# Patient Record
Sex: Female | Born: 1980 | Race: White | Hispanic: No | Marital: Married | Smoking: Former smoker
Health system: Southern US, Community
[De-identification: ages and names within clinical notes are randomized; demographics above are authoritative.]

## PROBLEM LIST (undated history)

## (undated) ENCOUNTER — Ambulatory Visit (HOSPITAL_BASED_OUTPATIENT_CLINIC_OR_DEPARTMENT_OTHER)

## (undated) DIAGNOSIS — I499 Cardiac arrhythmia, unspecified: Secondary | ICD-10-CM

## (undated) DIAGNOSIS — D759 Disease of blood and blood-forming organs, unspecified: Secondary | ICD-10-CM

## (undated) DIAGNOSIS — Z8669 Personal history of other diseases of the nervous system and sense organs: Secondary | ICD-10-CM

## (undated) DIAGNOSIS — R002 Palpitations: Secondary | ICD-10-CM

## (undated) DIAGNOSIS — R519 Headache, unspecified: Secondary | ICD-10-CM

## (undated) DIAGNOSIS — F419 Anxiety disorder, unspecified: Secondary | ICD-10-CM

## (undated) DIAGNOSIS — M1990 Unspecified osteoarthritis, unspecified site: Secondary | ICD-10-CM

## (undated) DIAGNOSIS — I8390 Asymptomatic varicose veins of unspecified lower extremity: Secondary | ICD-10-CM

## (undated) DIAGNOSIS — R51 Headache: Secondary | ICD-10-CM

## (undated) DIAGNOSIS — I739 Peripheral vascular disease, unspecified: Secondary | ICD-10-CM

## (undated) DIAGNOSIS — I2699 Other pulmonary embolism without acute cor pulmonale: Secondary | ICD-10-CM

## (undated) DIAGNOSIS — D649 Anemia, unspecified: Secondary | ICD-10-CM

## (undated) HISTORY — DX: Palpitations: R00.2

## (undated) HISTORY — PX: WISDOM TOOTH EXTRACTION: SHX21

## (undated) HISTORY — PX: TONSILLECTOMY: SUR1361

## (undated) HISTORY — DX: Asymptomatic varicose veins of unspecified lower extremity: I83.90

## (undated) HISTORY — PX: CHOLECYSTECTOMY: SHX55

## (undated) HISTORY — PX: TUBAL LIGATION: SHX77

## (undated) HISTORY — DX: Personal history of other diseases of the nervous system and sense organs: Z86.69

---

## 2001-06-03 ENCOUNTER — Inpatient Hospital Stay (HOSPITAL_COMMUNITY): Admission: AD | Admit: 2001-06-03 | Discharge: 2001-06-03 | Payer: Self-pay | Admitting: Obstetrics and Gynecology

## 2001-08-28 ENCOUNTER — Ambulatory Visit (HOSPITAL_COMMUNITY): Admission: RE | Admit: 2001-08-28 | Discharge: 2001-08-28 | Payer: Self-pay | Admitting: Obstetrics and Gynecology

## 2001-08-28 ENCOUNTER — Encounter: Payer: Self-pay | Admitting: Obstetrics and Gynecology

## 2001-09-01 ENCOUNTER — Ambulatory Visit (HOSPITAL_COMMUNITY): Admission: RE | Admit: 2001-09-01 | Discharge: 2001-09-01 | Payer: Self-pay | Admitting: Obstetrics and Gynecology

## 2001-09-03 ENCOUNTER — Inpatient Hospital Stay (HOSPITAL_COMMUNITY): Admission: AD | Admit: 2001-09-03 | Discharge: 2001-09-07 | Payer: Self-pay | Admitting: Obstetrics and Gynecology

## 2001-10-12 ENCOUNTER — Other Ambulatory Visit: Admission: RE | Admit: 2001-10-12 | Discharge: 2001-10-12 | Payer: Self-pay | Admitting: Obstetrics and Gynecology

## 2001-11-21 ENCOUNTER — Encounter: Payer: Self-pay | Admitting: Obstetrics and Gynecology

## 2001-11-21 ENCOUNTER — Ambulatory Visit (HOSPITAL_COMMUNITY): Admission: RE | Admit: 2001-11-21 | Discharge: 2001-11-21 | Payer: Self-pay | Admitting: Obstetrics and Gynecology

## 2001-12-12 ENCOUNTER — Observation Stay (HOSPITAL_COMMUNITY): Admission: RE | Admit: 2001-12-12 | Discharge: 2001-12-13 | Payer: Self-pay | Admitting: General Surgery

## 2001-12-12 ENCOUNTER — Encounter: Payer: Self-pay | Admitting: General Surgery

## 2003-07-29 ENCOUNTER — Other Ambulatory Visit: Admission: RE | Admit: 2003-07-29 | Discharge: 2003-07-29 | Payer: Self-pay | Admitting: Obstetrics and Gynecology

## 2003-12-25 ENCOUNTER — Inpatient Hospital Stay (HOSPITAL_COMMUNITY): Admission: AD | Admit: 2003-12-25 | Discharge: 2003-12-25 | Payer: Self-pay | Admitting: Obstetrics and Gynecology

## 2003-12-26 ENCOUNTER — Inpatient Hospital Stay (HOSPITAL_COMMUNITY): Admission: AD | Admit: 2003-12-26 | Discharge: 2003-12-26 | Payer: Self-pay | Admitting: Obstetrics and Gynecology

## 2004-01-01 ENCOUNTER — Inpatient Hospital Stay (HOSPITAL_COMMUNITY): Admission: AD | Admit: 2004-01-01 | Discharge: 2004-01-04 | Payer: Self-pay | Admitting: Obstetrics and Gynecology

## 2004-02-28 ENCOUNTER — Other Ambulatory Visit: Admission: RE | Admit: 2004-02-28 | Discharge: 2004-02-28 | Payer: Self-pay | Admitting: Obstetrics and Gynecology

## 2014-08-26 ENCOUNTER — Encounter: Payer: Self-pay | Admitting: Vascular Surgery

## 2014-08-26 ENCOUNTER — Other Ambulatory Visit: Payer: Self-pay | Admitting: *Deleted

## 2014-08-26 DIAGNOSIS — I83813 Varicose veins of bilateral lower extremities with pain: Secondary | ICD-10-CM

## 2014-10-11 ENCOUNTER — Encounter: Payer: Self-pay | Admitting: Vascular Surgery

## 2014-10-15 ENCOUNTER — Encounter (HOSPITAL_COMMUNITY): Payer: Self-pay

## 2014-10-15 ENCOUNTER — Encounter: Payer: Self-pay | Admitting: Vascular Surgery

## 2014-12-13 ENCOUNTER — Encounter: Payer: Self-pay | Admitting: Vascular Surgery

## 2014-12-17 ENCOUNTER — Encounter (HOSPITAL_COMMUNITY): Payer: BC Managed Care – PPO

## 2014-12-17 ENCOUNTER — Encounter: Payer: BC Managed Care – PPO | Admitting: Vascular Surgery

## 2015-06-09 DIAGNOSIS — I4892 Unspecified atrial flutter: Secondary | ICD-10-CM | POA: Insufficient documentation

## 2015-06-09 DIAGNOSIS — E0590 Thyrotoxicosis, unspecified without thyrotoxic crisis or storm: Secondary | ICD-10-CM | POA: Insufficient documentation

## 2015-06-09 DIAGNOSIS — R000 Tachycardia, unspecified: Secondary | ICD-10-CM | POA: Insufficient documentation

## 2015-06-09 DIAGNOSIS — R42 Dizziness and giddiness: Secondary | ICD-10-CM | POA: Insufficient documentation

## 2015-06-09 DIAGNOSIS — E878 Other disorders of electrolyte and fluid balance, not elsewhere classified: Secondary | ICD-10-CM | POA: Insufficient documentation

## 2015-06-09 DIAGNOSIS — I4891 Unspecified atrial fibrillation: Secondary | ICD-10-CM | POA: Insufficient documentation

## 2015-09-18 DIAGNOSIS — I83812 Varicose veins of left lower extremities with pain: Secondary | ICD-10-CM | POA: Insufficient documentation

## 2016-04-20 DIAGNOSIS — K219 Gastro-esophageal reflux disease without esophagitis: Secondary | ICD-10-CM | POA: Insufficient documentation

## 2016-04-20 DIAGNOSIS — G43109 Migraine with aura, not intractable, without status migrainosus: Secondary | ICD-10-CM | POA: Insufficient documentation

## 2016-05-02 ENCOUNTER — Encounter (HOSPITAL_COMMUNITY): Payer: Self-pay

## 2016-05-02 ENCOUNTER — Other Ambulatory Visit: Payer: Self-pay

## 2016-05-02 ENCOUNTER — Emergency Department (HOSPITAL_COMMUNITY): Payer: BC Managed Care – PPO

## 2016-05-02 ENCOUNTER — Emergency Department (HOSPITAL_BASED_OUTPATIENT_CLINIC_OR_DEPARTMENT_OTHER)
Admit: 2016-05-02 | Discharge: 2016-05-02 | Disposition: A | Discharge status: Home / Self Care | Payer: BC Managed Care – PPO

## 2016-05-02 ENCOUNTER — Observation Stay (HOSPITAL_COMMUNITY)
Admission: EM | Admit: 2016-05-02 | Discharge: 2016-05-03 | Disposition: A | Discharge status: Home / Self Care | Payer: BC Managed Care – PPO | Attending: Internal Medicine | Admitting: Internal Medicine

## 2016-05-02 DIAGNOSIS — Z87891 Personal history of nicotine dependence: Secondary | ICD-10-CM | POA: Diagnosis not present

## 2016-05-02 DIAGNOSIS — Z79899 Other long term (current) drug therapy: Secondary | ICD-10-CM | POA: Diagnosis not present

## 2016-05-02 DIAGNOSIS — R52 Pain, unspecified: Secondary | ICD-10-CM

## 2016-05-02 DIAGNOSIS — I2699 Other pulmonary embolism without acute cor pulmonale: Secondary | ICD-10-CM | POA: Diagnosis not present

## 2016-05-02 LAB — BASIC METABOLIC PANEL
Anion gap: 8 (ref 5–15)
BUN: 8 mg/dL (ref 6–20)
CALCIUM: 9.6 mg/dL (ref 8.9–10.3)
CHLORIDE: 105 mmol/L (ref 101–111)
CO2: 24 mmol/L (ref 22–32)
Creatinine, Ser: 0.69 mg/dL (ref 0.44–1.00)
GFR calc non Af Amer: >60 mL/min (ref >60)
Glucose, Bld: 109 mg/dL — ABNORMAL HIGH (ref 65–99)
POTASSIUM: 3.6 mmol/L (ref 3.5–5.1)
SODIUM: 137 mmol/L (ref 135–145)

## 2016-05-02 LAB — CBC
HCT: 39.2 % (ref 36.0–46.0)
HEMOGLOBIN: 13.5 g/dL (ref 12.0–15.0)
MCH: 30.3 pg (ref 26.0–34.0)
MCHC: 34.4 g/dL (ref 30.0–36.0)
MCV: 87.9 fL (ref 78.0–100.0)
Platelets: 200 10*3/uL (ref 150–400)
RBC: 4.46 MIL/uL (ref 3.87–5.11)
RDW: 12.7 % (ref 11.5–15.5)
WBC: 7.7 10*3/uL (ref 4.0–10.5)

## 2016-05-02 LAB — I-STAT TROPONIN, ED: TROPONIN I, POC: 0 ng/mL (ref 0.00–0.08)

## 2016-05-02 LAB — I-STAT BETA HCG BLOOD, ED (MC, WL, AP ONLY): I-stat hCG, quantitative: <5 m[IU]/mL (ref <5)

## 2016-05-02 LAB — D-DIMER, QUANTITATIVE (NOT AT ARMC): D DIMER QUANT: 1.25 ug{FEU}/mL — AB (ref 0.00–0.50)

## 2016-05-02 MED ORDER — IOPAMIDOL (ISOVUE-370) INJECTION 76%
INTRAVENOUS | Status: AC
  Filled 2016-05-02: qty 100

## 2016-05-02 MED ORDER — RIVAROXABAN 15 MG PO TABS
15.0000 mg | ORAL_TABLET | Freq: Once | ORAL | Status: AC
Start: 2016-05-02 — End: 2016-05-02
  Administered 2016-05-02: 15 mg via ORAL
  Filled 2016-05-02: qty 1

## 2016-05-02 MED ORDER — IOPAMIDOL (ISOVUE-370) INJECTION 76%
INTRAVENOUS | Status: AC
  Administered 2016-05-02: 100 mL
  Filled 2016-05-02: qty 100

## 2016-05-02 MED ORDER — RIVAROXABAN (XARELTO) EDUCATION KIT FOR DVT/PE PATIENTS
PACK | Freq: Once | Status: AC
  Administered 2016-05-02: 23:00:00
  Filled 2016-05-02: qty 1

## 2016-05-02 NOTE — Discharge Instructions (Signed)
Information on my medicine - XARELTO (rivaroxaban)  This medication education was reviewed with me or my healthcare representative as part of my discharge preparation.  The pharmacist that spoke with me during my hospital stay was:  Clive Parcel George, [facility]  WHY WAS Erica Ho PRESCRIBED FOR YOU? Xarelto was prescribed to treat blood clots that may have been found in the veins of your legs (deep vein thrombosis) or in your lungs (pulmonary embolism) and to reduce the risk of them occurring again.  What do you need to know about Xarelto? The starting dose is one 15 mg tablet taken TWICE daily with food for the FIRST 21 DAYS then on December 25  the dose is changed to one 20 mg tablet taken ONCE A DAY with your evening meal.  DO NOT stop taking Xarelto without talking to the health care provider who prescribed the medication.  Refill your prescription for 20 mg tablets before you run out.  After discharge, you should have regular check-up appointments with your healthcare provider that is prescribing your Xarelto.  In the future your dose may need to be changed if your kidney function changes by a significant amount.  What do you do if you miss a dose? If you are taking Xarelto TWICE DAILY and you miss a dose, take it as soon as you remember. You may take two 15 mg tablets (total 30 mg) at the same time then resume your regularly scheduled 15 mg twice daily the next day.  If you are taking Xarelto ONCE DAILY and you miss a dose, take it as soon as you remember on the same day then continue your regularly scheduled once daily regimen the next day. Do not take two doses of Xarelto at the same time.   Important Safety Information Xarelto is a blood thinner medicine that can cause bleeding. You should call your healthcare provider right away if you experience any of the following: ? Bleeding from an injury or your nose that does not stop. ? Unusual colored urine (red or dark brown) or unusual  colored stools (red or black). ? Unusual bruising for unknown reasons. ? A serious fall or if you hit your head (even if there is no bleeding).  Some medicines may interact with Xarelto and might increase your risk of bleeding while on Xarelto. To help avoid this, consult your healthcare provider or pharmacist prior to using any new prescription or non-prescription medications, including herbals, vitamins, non-steroidal anti-inflammatory drugs (NSAIDs) and supplements.  This website has more information on Xarelto: [email or web address].xarelto.[email or web address].

## 2016-05-02 NOTE — Progress Notes (Signed)
VASCULAR LAB PRELIMINARY  PRELIMINARY  PRELIMINARY  PRELIMINARY  Left lower extremity venous duplex completed.    Preliminary report:  There is no DVT noted in the left lower extremity.  There is superficial thrombophlebitis noted throughout a varicosity in the left calf.   Gave report to [facility]iotta, PA-C  Adrian Dinovo, RVT 05/02/2016, 6:41 PM

## 2016-05-02 NOTE — ED Notes (Signed)
Spoke to Vascular tech and she is aware of the patient

## 2016-05-02 NOTE — ED Notes (Addendum)
Pt reported having mild central chest pain 1/10. Repeat EKG obtained and Dr. Effie ShyWentz made aware. VSS. NAD

## 2016-05-02 NOTE — ED Provider Notes (Signed)
[place] DEPT Provider Note   CSN: [id number] Arrival date & time: 05/02/16  1546     History   Chief Complaint CC: Palpitations   HPI   Blood pressure 126/80, pulse 94, temperature 97.7 F (36.5 C), temperature source Oral, resp. rate 13, weight 68.5 kg, SpO2 100 %.  Erica Ho is a 35 y.o. female complaining of sensation that is her heart is racing onset yesterday, lasted approximately 1 hour yesterday and she's had 2 episodes related last about 15 minutes. She has a lightheaded sensation like she is going to pass out when she has the episodes. There was no syncope. No associated chest pain, fever, cough, shortness of breath. She was seen at [facility] yesterday and had a normal workup including a normal TSH (patient has shown me the results on her phone). She has seen a cardiologist proximally 1 year ago and had a normal echo and normal on for long Holter monitoring. She was offered propranolol which she declined. 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 She's not on any hormonal birth control and she denies any recent immobilizations or history of DVT/PE, fever, hemoptysis. States that she doesn't intake any caffeine or energy drinks. She been eating and drinking normally does not take NSAIDs and has not had any melena or hematochezia.  Past Medical History:  Diagnosis Date  . History of migraine headaches   . Varicose veins     Patient Active Problem List   Diagnosis Date Noted  . Acute pulmonary embolism ([place]) 05/03/2016    Past Surgical History:  Procedure Laterality Date  . CESAREAN SECTION    . CHOLECYSTECTOMY    . TONSILLECTOMY    . TUBAL LIGATION      OB History    No data available       Home Medications    Prior to Admission medications   Medication Sig Start Date End Date Taking? Authorizing Provider  acetaminophen (TYLENOL) 500 MG  tablet Take 1,000 mg by mouth every 6 (six) hours as needed.   Yes Historical Provider, MD  esomeprazole (NEXIUM) 20 MG capsule Take 20 mg by mouth daily at 12 noon.   Yes Historical Provider, MD    Family History Family History  Problem Relation Age of Onset  . Clotting disorder Neg Hx     Social History Social History  Substance Use Topics  . Smoking status: Former Smoker    Quit date: 08/26/2011  . Smokeless tobacco: Never Used  . Alcohol use No     Allergies   Imitrex [sumatriptan]; Levaquin [levofloxacin in d5w]; and Levofloxacin   Review of Systems Review of Systems  10 systems reviewed and found to be negative, except as noted in the HPI.   Physical Exam Updated Vital Signs BP 124/73 (BP Location: Right Arm)   Pulse 87   Temp 98.6 F (37 C) (Oral)   Resp 16   Ht 5' 1"  (1.549 m)   Wt 67.7 kg   SpO2 100%   BMI 28.19 kg/m   Physical Exam  Constitutional: She is oriented to person, place, and time. She appears well-developed and well-nourished. No distress.  HENT:  Head: Normocephalic and atraumatic.  Mouth/Throat: Oropharynx is clear and moist.  Eyes: Conjunctivae and EOM are normal. Pupils are equal, round, and reactive to light.  Neck: Normal range of motion. No JVD present. No  tracheal deviation present.  Cardiovascular: Normal rate, regular rhythm and intact distal pulses.   Radial pulse equal bilaterally  Pulmonary/Chest: Effort normal and breath sounds normal. No stridor. No respiratory distress. She has no wheezes. She has no rales. She exhibits no tenderness.  Abdominal: Soft. She exhibits no distension and no mass. There is no tenderness. There is no rebound and no guarding.  Musculoskeletal: Normal range of motion. She exhibits no edema or tenderness.  Left lower extremity with compression stocking, mild tenderness to palpation along the posterior calf. No calf asymmetry, superficial collaterals, palpable cords, edema.    Neurological: She is  alert and oriented to person, place, and time.  Skin: Skin is warm. Capillary refill takes less than 2 seconds. She is not diaphoretic.  Psychiatric: She has a normal mood and affect.  Nursing note and vitals reviewed.    ED Treatments / Results  Labs (all labs ordered are listed, but only abnormal results are displayed) Labs Reviewed  BASIC METABOLIC PANEL - Abnormal; Notable for the following:       Result Value   Glucose, Bld 109 (*)    All other components within normal limits  D-DIMER, QUANTITATIVE (NOT AT [facility]) - Abnormal; Notable for the following:    D-Dimer, Quant 1.25 (*)    All other components within normal limits  CBC  CBC  I-STAT TROPOININ, ED  I-STAT BETA HCG BLOOD, ED ([facility], [facility], [facility] ONLY)    EKG  EKG Interpretation  Date/Time:  Sunday May 02 2016 15:52:14 EST Ventricular Rate:  94 PR Interval:  142 QRS Duration: 78 QT Interval:  340 QTC Calculation: 425 R Axis:   86 Text Interpretation:  Normal sinus rhythm ST abnormality, possible digitalis effect Abnormal ECG No old tracing to compare Confirmed by [facility]  MD, ELLIOTT [phone]) on 05/02/2016 8:48:16 PM       Radiology Ct Angio Chest Pe W And/or Wo Contrast  Result Date: 05/02/2016 CLINICAL DATA:  Palpitations. EXAM: CT ANGIOGRAPHY CHEST WITH CONTRAST TECHNIQUE: Multidetector CT imaging of the chest was performed using the standard protocol during bolus administration of intravenous contrast. Multiplanar CT image reconstructions and MIPs were obtained to evaluate the vascular anatomy. CONTRAST:  100 cc Isovue 370 intravenous COMPARISON:  None. FINDINGS: Cardiovascular: There is a branching subsegmental filling defect in the medial basal segment left lower lobe consistent with acute embolism. No other embolism is noted. Normal heart size.  No pericardial Mediastinum/Nodes: Negative for adenopathy or mass. Lungs/Pleura: There is no edema, consolidation, effusion, or pneumothorax. Average 3 mm pulmonary nodule in  the right lower lobe, 6:85. Upper Abdomen: Cholecystectomy. Musculoskeletal: No acute or aggressive finding Critical Value/emergent results were called by telephone at the time of interpretation on 05/02/2016 at 9:32 pm to Dr. Monico Blitz , who verbally acknowledged these results. Review of the MIP images confirms the above findings. IMPRESSION: 1. Positive for single subsegmental acute pulmonary embolism in the left lower lobe. 2. 3 mm right lower lobe pulmonary nodule. No follow-up needed if patient is low-risk. Non-contrast chest CT can be considered in 12 months if patient is high-risk. This recommendation follows the consensus statement: Guidelines for Management of Incidental Pulmonary Nodules Detected on CT Images: From the Fleischner Society 2017; Radiology 2017; 284:228-243. Electronically Signed   By: Monte Fantasia M.D.   On: 05/02/2016 21:35    Procedures Procedures (including critical care time)  Medications Ordered in ED Medications  Rivaroxaban (XARELTO) tablet 15 mg (not administered)    Followed by  rivaroxaban (XARELTO) tablet 20 mg (not administered)  Influenza vac split quadrivalent PF (FLUARIX) injection 0.5 mL (not administered)  iopamidol (ISOVUE-370) 76 % injection (100 mLs  Contrast Given 05/02/16 2026)  rivaroxaban [employer]) Education Kit for DVT/PE patients ( Does not apply Given 05/02/16 2310)  Rivaroxaban (XARELTO) tablet 15 mg (15 mg Oral Given 05/02/16 2310)     Initial Impression / Assessment and Plan / ED Course  I have reviewed the triage vital signs and the nursing notes.  Pertinent labs & imaging results that were available during my care of the patient were reviewed by me and considered in my medical decision making (see chart for details).  Clinical Course     Vitals:   05/02/16 2330 05/02/16 2345 05/03/16 0000 05/03/16 0122  BP: 100/56 118/72 120/81 124/73  Pulse: 74 88 76 87  Resp: 15 16 16 16   Temp:    98.6 F (37 C)  TempSrc:    Oral    SpO2: 98% 98% 99% 100%  Weight:    67.7 kg  Height:    5' 1"  (1.549 m)    Medications  Rivaroxaban (XARELTO) tablet 15 mg (not administered)    Followed by  rivaroxaban (XARELTO) tablet 20 mg (not administered)  Influenza vac split quadrivalent PF (FLUARIX) injection 0.5 mL (not administered)  iopamidol (ISOVUE-370) 76 % injection (100 mLs  Contrast Given 05/02/16 2026)  rivaroxaban [employer]) Education Kit for DVT/PE patients ( Does not apply Given 05/02/16 2310)  Rivaroxaban (XARELTO) tablet 15 mg (15 mg Oral Given 05/02/16 2310)    Olin Hauser C. Cage is 35 y.o. female presenting with Palpitations and feeling lightheaded. She has very mild chest pain. She's been worked up for palpitations in the past with a negative Holter monitor and echo 1 year ago. States that these palpitations feel different and they last for quite a bit longer and she feels more lightheaded. She had an episode lasting approximately 1 hour yesterday. She did not take any hormonal birth control.   EKG is without acute abnormality. Blood work reassuring with a negative troponin. D-dimer is elevated.  Radiology consult from Dr. Pascal Lux appreciated, positive for single sub-subsegmental acute PE. No signs of a right heart strain. Low risk bypass E score, recommend initiating Xarelto in close follow with primary care. Patient is extremely agitated and nervous and recommends admission to the hospital.  Unassigned admission to [facility] hospitalist Dr. Alcario Drought.      Final Clinical Impressions(s) / ED Diagnoses   Final diagnoses:  Pain  Other acute pulmonary embolism without acute cor pulmonale [facility])    New Prescriptions Current Discharge Medication List       Monico Blitz, PA-C 05/03/16 0154    Daleen Bo, MD 05/05/16 1016

## 2016-05-02 NOTE — ED Triage Notes (Signed)
Patient with intermittent rapid heart rate for the past few days. Seen at [facility] ED yesterday with no diagnosis. Has sen cardiology for same in past and had ECHO. On arrival states  That the symptoms have resolved. Last episode was at church today with near syncope. Alert and oriented

## 2016-05-03 ENCOUNTER — Encounter (HOSPITAL_COMMUNITY): Payer: Self-pay | Admitting: Internal Medicine

## 2016-05-03 DIAGNOSIS — R002 Palpitations: Secondary | ICD-10-CM | POA: Insufficient documentation

## 2016-05-03 DIAGNOSIS — I2699 Other pulmonary embolism without acute cor pulmonale: Secondary | ICD-10-CM | POA: Diagnosis not present

## 2016-05-03 LAB — CBC
HEMATOCRIT: 37 % (ref 36.0–46.0)
HEMOGLOBIN: 12.9 g/dL (ref 12.0–15.0)
MCH: 30.3 pg (ref 26.0–34.0)
MCHC: 34.9 g/dL (ref 30.0–36.0)
MCV: 86.9 fL (ref 78.0–100.0)
Platelets: 184 10*3/uL (ref 150–400)
RBC: 4.26 MIL/uL (ref 3.87–5.11)
RDW: 12.4 % (ref 11.5–15.5)
WBC: 8.8 10*3/uL (ref 4.0–10.5)

## 2016-05-03 MED ORDER — RIVAROXABAN (XARELTO) VTE STARTER PACK (15 & 20 MG): ORAL_TABLET | ORAL | 0 refills | Status: DC

## 2016-05-03 MED ORDER — RIVAROXABAN 20 MG PO TABS: 20.0000 mg | ORAL_TABLET | Freq: Every day | ORAL | Status: DC

## 2016-05-03 MED ORDER — RIVAROXABAN 15 MG PO TABS
15.0000 mg | ORAL_TABLET | Freq: Two times a day (BID) | ORAL | Status: DC
  Administered 2016-05-03: 15 mg via ORAL
  Filled 2016-05-03 (×2): qty 1

## 2016-05-03 MED ORDER — INFLUENZA VAC SPLIT QUAD 0.5 ML IM SUSY: 0.5000 mL | PREFILLED_SYRINGE | INTRAMUSCULAR | Status: DC

## 2016-05-03 NOTE — Progress Notes (Signed)
Patient given discharge instructions and all questions answered.  Pt. Has already received information on xarelto and the xarelto card.  Patient refused wheelchair and walked out with NT.

## 2016-05-03 NOTE — Discharge Summary (Signed)
Physician Discharge Summary  Erica Ho XBM:[id number] DOB: 09/17/1980 DOA: 05/02/2016  PCP: Dina RichUGH,ROBERT, MD  Admit date: 05/02/2016 Discharge date: 05/03/2016   Recommendations for Outpatient Follow-Up:   3 mm right lower lobe pulmonary nodule. Non-contrast chest CT can be considered in 12 months if patient is high-risk.    Discharge Diagnosis:   Active Problems:   Acute pulmonary embolism [facility](HCC)   Discharge disposition:  Home.  SNF:  Discharge Condition: Improved.  Diet recommendation: Low sodium, heart healthy.  Carbohydrate-modified.  Regular.  Wound care: None.   History of Present Illness:   Erica Ho is a 35 y.o. female with medical history significant of Varicose veins including prior and occasional superficial venous thrombosis of the leg.  Patient presents to the ED with c/o 1/10 central chest pain.  Onset yesterday.  Intermittent.  Associated palpitations.  [facility], they did a work that was unremarkable including TSH.  Actually has been having intermittent palpitations for over a year now it sounds like.   Hospital Course by Problem:   1. Acute PE - 1. Xarelto 2. Few episodes of tachycardia-- cardiology follow up if does not resolve with treatment of PE    Medical Consultants:    None.   Discharge Exam:   Vitals:   05/03/16 0122 05/03/16 0624  BP: 124/73 107/68  Pulse: 87 84  Resp: 16 18  Temp: 98.6 F (37 C) 98.3 F (36.8 C)   Vitals:   05/02/16 2345 05/03/16 0000 05/03/16 0122 05/03/16 0624  BP: 118/72 120/81 124/73 107/68  Pulse: 88 76 87 84  Resp: 16 16 16 18   Temp:   98.6 F (37 C) 98.3 F (36.8 C)  TempSrc:   Oral Oral  SpO2: 98% 99% 100% 100%  Weight:   67.7 kg (149 lb 3.2 oz) 67.5 kg (148 lb 14.4 oz)  Height:   5\' 1"  (1.549 m)     Gen:  NAD   The results of significant diagnostics from this hospitalization (including imaging, microbiology, ancillary and laboratory) are listed  below for reference.     Procedures and Diagnostic Studies:   Ct Angio Chest Pe W And/or Wo Contrast  Result Date: 05/02/2016 CLINICAL DATA:  Palpitations. EXAM: CT ANGIOGRAPHY CHEST WITH CONTRAST TECHNIQUE: Multidetector CT imaging of the chest was performed using the standard protocol during bolus administration of intravenous contrast. Multiplanar CT image reconstructions and MIPs were obtained to evaluate the vascular anatomy. CONTRAST:  100 cc Isovue 370 intravenous COMPARISON:  None. FINDINGS: Cardiovascular: There is a branching subsegmental filling defect in the medial basal segment left lower lobe consistent with acute embolism. No other embolism is noted. Normal heart size.  No pericardial Mediastinum/Nodes: Negative for adenopathy or mass. Lungs/Pleura: There is no edema, consolidation, effusion, or pneumothorax. Average 3 mm pulmonary nodule in the right lower lobe, 6:85. Upper Abdomen: Cholecystectomy. Musculoskeletal: No acute or aggressive finding Critical Value/emergent results were called by telephone at the time of interpretation on 05/02/2016 at 9:32 pm to Dr. Wynetta EmeryNICOLE PISCIOTTA , who verbally acknowledged these results. Review of the MIP images confirms the above findings. IMPRESSION: 1. Positive for single subsegmental acute pulmonary embolism in the left lower lobe. 2. 3 mm right lower lobe pulmonary nodule. No follow-up needed if patient is low-risk. Non-contrast chest CT can be considered in 12 months if patient is high-risk. This recommendation follows the consensus statement: Guidelines for Management of Incidental Pulmonary Nodules Detected on CT Images: From the Fleischner Society 2017;  Radiology 2017; [id number]:228-243. Electronically Signed   By: Marnee SpringJonathon  Watts M.D.   On: 05/02/2016 21:35     Labs:   Basic Metabolic Panel:  Recent Labs Lab 05/02/16 1604  NA 137  K 3.6  CL 105  CO2 24  GLUCOSE 109*  BUN 8  CREATININE 0.69  CALCIUM 9.6   GFR Estimated Creatinine  Clearance: 84.7 mL/min (by C-G formula based on SCr of 0.69 mg/dL). Liver Function Tests: No results for input(s): AST, ALT, ALKPHOS, BILITOT, PROT, ALBUMIN in the last 168 hours. No results for input(s): LIPASE, AMYLASE in the last 168 hours. No results for input(s): AMMONIA in the last 168 hours. Coagulation profile No results for input(s): INR, PROTIME in the last 168 hours.  CBC:  Recent Labs Lab 05/02/16 1604 05/03/16 0352  WBC 7.7 8.8  HGB 13.5 12.9  HCT 39.2 37.0  MCV 87.9 86.9  PLT 200 184   Cardiac Enzymes: No results for input(s): CKTOTAL, CKMB, CKMBINDEX, TROPONINI in the last 168 hours. BNP: Invalid input(s): POCBNP CBG: No results for input(s): GLUCAP in the last 168 hours. D-Dimer  Recent Labs  05/02/16 1604  DDIMER 1.25*   Hgb A1c No results for input(s): HGBA1C in the last 72 hours. Lipid Profile No results for input(s): CHOL, HDL, LDLCALC, TRIG, CHOLHDL, LDLDIRECT in the last 72 hours. Thyroid function studies No results for input(s): TSH, T4TOTAL, T3FREE, THYROIDAB in the last 72 hours.  Invalid input(s): FREET3 Anemia work up No results for input(s): VITAMINB12, FOLATE, FERRITIN, TIBC, IRON, RETICCTPCT in the last 72 hours. Microbiology No results found for this or any previous visit (from the past 240 hour(s)).   Discharge Instructions:   Discharge Instructions    Diet general    Complete by:  As directed    Discharge instructions    Complete by:  As directed    Cbc 2 weeks and prn there after Outpatient cardiology referral   Increase activity slowly    Complete by:  As directed        Medication List    TAKE these medications   acetaminophen 500 MG tablet Commonly known as:  TYLENOL Take 1,000 mg by mouth every 6 (six) hours as needed.   esomeprazole 20 MG capsule Commonly known as:  NEXIUM Take 20 mg by mouth daily at 12 noon.   Rivaroxaban 15 & 20 MG Tbpk Take as directed on package: Start with one 15mg  tablet by mouth  twice a day with food. On Day 22, switch to one 20mg  tablet once a day with food.      Follow-up Information    DOUGH,ROBERT, MD Follow up in 1 week(s).   Specialty:  Family Medicine Contact information: [address] [place] [address] [phone]            Time coordinating discharge: 35 min  Signed:  Dayle Sherpa Juanetta GoslingU Ayodele Sangalang   [facility] 05/03/2016, 8:26 AM

## 2016-05-03 NOTE — Progress Notes (Signed)
Patient admitted to 3E16 from ED. Patient awake, oriented, VSS. Family at bedside and oriented to unit and call bell. Will continue to monitor.

## 2016-05-03 NOTE — H&P (Signed)
History and Physical    Erica Ho VHQ:[id number] DOB: 04/05/1981 DOA: 05/02/2016   PCP: Dina RichUGH,ROBERT, MD Chief Complaint: No chief complaint on file.   HPI: Erica Ho is a 35 y.o. female with medical history significant of Varicose veins including prior and occasional superficial venous thrombosis of the leg.  Patient presents to the ED with c/o 1/10 central chest pain.  Onset yesterday.  Intermittent.  Associated palpitations.  [facility], they did a work that was unremarkable including TSH.  Actually has been having intermittent palpitations for over a year now it sounds like.  ED Course: Work up in ED shows small subsegmental PE.  Review of Systems: As per HPI otherwise 10 point review of systems negative.    Past Medical History:  Diagnosis Date  . History of migraine headaches   . Varicose veins     Past Surgical History:  Procedure Laterality Date  . CESAREAN SECTION    . CHOLECYSTECTOMY    . TONSILLECTOMY    . TUBAL LIGATION       reports that she quit smoking about 4 years ago. She has never used smokeless tobacco. She reports that she does not drink alcohol or use drugs.  Allergies  Allergen Reactions  . Imitrex [Sumatriptan] Palpitations  . Levaquin [Levofloxacin In D5w]   . Levofloxacin Other (See Comments)    Skin felt like it was on "fire"    Family History  Problem Relation Age of Onset  . Clotting disorder Neg Hx      Prior to Admission medications   Medication Sig Start Date End Date Taking? Authorizing Provider  acetaminophen (TYLENOL) 500 MG tablet Take 1,000 mg by mouth every 6 (six) hours as needed.   Yes Historical Provider, MD  esomeprazole (NEXIUM) 20 MG capsule Take 20 mg by mouth daily at 12 noon.   Yes Historical Provider, MD    Physical Exam: Vitals:   05/02/16 2145 05/02/16 2315 05/02/16 2330 05/02/16 2345  BP:  121/83 100/56 118/72  Pulse: 80 79 74 88  Resp: 22 14 15 16   Temp:        TempSrc:      SpO2: 98% 99% 98% 98%  Weight:          Constitutional: NAD, calm, comfortable Eyes: PERRL, lids and conjunctivae normal ENMT: Mucous membranes are moist. Posterior pharynx clear of any exudate or lesions.Normal dentition.  Neck: normal, supple, no masses, no thyromegaly Respiratory: clear to auscultation bilaterally, no wheezing, no crackles. Normal respiratory effort. No accessory muscle use.  Cardiovascular: Regular rate and rhythm, no murmurs / rubs / gallops. No extremity edema. 2+ pedal pulses. No carotid bruits.  Abdomen: no tenderness, no masses palpated. No hepatosplenomegaly. Bowel sounds positive.  Musculoskeletal: no clubbing / cyanosis. No joint deformity upper and lower extremities. Good ROM, no contractures. Normal muscle tone.  Skin: no rashes, lesions, ulcers. No induration Neurologic: CN 2-12 grossly intact. Sensation intact, DTR normal. Strength 5/5 in all 4.  Psychiatric: Normal judgment and insight. Alert and oriented x 3. Normal mood.    Labs on Admission: I have personally reviewed following labs and imaging studies  CBC:  Recent Labs Lab 05/02/16 1604  WBC 7.7  HGB 13.5  HCT 39.2  MCV 87.9  PLT 200   Basic Metabolic Panel:  Recent Labs Lab 05/02/16 1604  NA 137  K 3.6  CL 105  CO2 24  GLUCOSE 109*  BUN 8  CREATININE 0.69  CALCIUM 9.6  GFR: CrCl cannot be calculated (Unknown ideal weight.). Liver Function Tests: No results for input(s): AST, ALT, ALKPHOS, BILITOT, PROT, ALBUMIN in the last 168 hours. No results for input(s): LIPASE, AMYLASE in the last 168 hours. No results for input(s): AMMONIA in the last 168 hours. Coagulation Profile: No results for input(s): INR, PROTIME in the last 168 hours. Cardiac Enzymes: No results for input(s): CKTOTAL, CKMB, CKMBINDEX, TROPONINI in the last 168 hours. BNP (last 3 results) No results for input(s): PROBNP in the last 8760 hours. HbA1C: No results for input(s): HGBA1C in the  last 72 hours. CBG: No results for input(s): GLUCAP in the last 168 hours. Lipid Profile: No results for input(s): CHOL, HDL, LDLCALC, TRIG, CHOLHDL, LDLDIRECT in the last 72 hours. Thyroid Function Tests: No results for input(s): TSH, T4TOTAL, FREET4, T3FREE, THYROIDAB in the last 72 hours. Anemia Panel: No results for input(s): VITAMINB12, FOLATE, FERRITIN, TIBC, IRON, RETICCTPCT in the last 72 hours. Urine analysis: No results found for: COLORURINE, APPEARANCEUR, LABSPEC, PHURINE, GLUCOSEU, HGBUR, BILIRUBINUR, KETONESUR, PROTEINUR, UROBILINOGEN, NITRITE, LEUKOCYTESUR Sepsis Labs: @LABRCNTIP (procalcitonin:4,lacticidven:4) )No results found for this or any previous visit (from the past 240 hour(s)).   Radiological Exams on Admission: Ct Angio Chest Pe W And/or Wo Contrast  Result Date: 05/02/2016 CLINICAL DATA:  Palpitations. EXAM: CT ANGIOGRAPHY CHEST WITH CONTRAST TECHNIQUE: Multidetector CT imaging of the chest was performed using the standard protocol during bolus administration of intravenous contrast. Multiplanar CT image reconstructions and MIPs were obtained to evaluate the vascular anatomy. CONTRAST:  100 cc Isovue 370 intravenous COMPARISON:  None. FINDINGS: Cardiovascular: There is a branching subsegmental filling defect in the medial basal segment left lower lobe consistent with acute embolism. No other embolism is noted. Normal heart size.  No pericardial Mediastinum/Nodes: Negative for adenopathy or mass. Lungs/Pleura: There is no edema, consolidation, effusion, or pneumothorax. Average 3 mm pulmonary nodule in the right lower lobe, 6:85. Upper Abdomen: Cholecystectomy. Musculoskeletal: No acute or aggressive finding Critical Value/emergent results were called by telephone at the time of interpretation on 05/02/2016 at 9:32 pm to Dr. Wynetta EmeryNICOLE PISCIOTTA , who verbally acknowledged these results. Review of the MIP images confirms the above findings. IMPRESSION: 1. Positive for single  subsegmental acute pulmonary embolism in the left lower lobe. 2. 3 mm right lower lobe pulmonary nodule. No follow-up needed if patient is low-risk. Non-contrast chest CT can be considered in 12 months if patient is high-risk. This recommendation follows the consensus statement: Guidelines for Management of Incidental Pulmonary Nodules Detected on CT Images: From the Fleischner Society 2017; Radiology 2017; 284:228-243. Electronically Signed   By: Marnee SpringJonathon  Watts M.D.   On: 05/02/2016 21:35    EKG: Independently reviewed.  Assessment/Plan Active Problems:   Acute pulmonary embolism (HCC)    1. Acute PE - 1. Xarelto 2. Tele monitor 3. Home in AM, follow up with PCP   DVT prophylaxis: Xarelto Code Status: Full Family Communication: Family at bedside Consults called: None Admission status: Admit to obs   GARDNER, Heywood IlesJARED M. DO [facility] Pager [phone] from 7PM-7AM  If 7AM-7PM, please contact the day physician for the patient [email or web address] Password [email or web address]  05/03/2016, 12:05 AM

## 2016-05-03 NOTE — Progress Notes (Signed)
ANTICOAGULATION CONSULT NOTE - Initial Consult  Pharmacy Consult for Xarelto Indication: pulmonary embolus  Allergies  Allergen Reactions  . Imitrex [Sumatriptan] Palpitations  . Levaquin [Levofloxacin In D5w]   . Levofloxacin Other (See Comments)    Skin felt like it was on "fire"    Patient Measurements: Weight: 151 lb (68.5 kg)  Vital Signs: Temp: 97.7 F (36.5 C) (12/03 1558) Temp Source: Oral (12/03 1558) BP: 118/72 (12/03 2345) Pulse Rate: 88 (12/03 2345)  Labs:  Recent Labs  05/02/16 1604  HGB 13.5  HCT 39.2  PLT 200  CREATININE 0.69    Medical History: Past Medical History:  Diagnosis Date  . History of migraine headaches   . Varicose veins     Assessment: 35yo female c/o intermittent rapid HR, was seen at other facilities without dx, D-dimer found to be elevated, CT shows PE, to begin anticoagulation.  Pt was educated re: Xarelto by pharmacist Christoper Fabianaron Amend prior to plan to be admitted.   Plan:  Will begin Xarelto 15mg  PO BID x21 days then 20mg  daily.  Vernard GamblesVeronda Orena Cavazos, PharmD, BCPS  05/03/2016,12:07 AM

## 2016-05-04 ENCOUNTER — Emergency Department (HOSPITAL_COMMUNITY)
Admission: EM | Admit: 2016-05-04 | Discharge: 2016-05-04 | Disposition: A | Discharge status: Home / Self Care | Payer: BC Managed Care – PPO | Attending: Emergency Medicine | Admitting: Emergency Medicine

## 2016-05-04 ENCOUNTER — Encounter (HOSPITAL_COMMUNITY): Payer: Self-pay | Admitting: *Deleted

## 2016-05-04 ENCOUNTER — Emergency Department (HOSPITAL_COMMUNITY): Payer: BC Managed Care – PPO

## 2016-05-04 DIAGNOSIS — Z7901 Long term (current) use of anticoagulants: Secondary | ICD-10-CM | POA: Insufficient documentation

## 2016-05-04 DIAGNOSIS — R0789 Other chest pain: Secondary | ICD-10-CM | POA: Diagnosis not present

## 2016-05-04 DIAGNOSIS — Z87891 Personal history of nicotine dependence: Secondary | ICD-10-CM | POA: Diagnosis not present

## 2016-05-04 DIAGNOSIS — R0602 Shortness of breath: Secondary | ICD-10-CM | POA: Diagnosis present

## 2016-05-04 DIAGNOSIS — Z8669 Personal history of other diseases of the nervous system and sense organs: Secondary | ICD-10-CM | POA: Insufficient documentation

## 2016-05-04 HISTORY — DX: Other pulmonary embolism without acute cor pulmonale: I26.99

## 2016-05-04 LAB — BASIC METABOLIC PANEL
Anion gap: 9 (ref 5–15)
BUN: 8 mg/dL (ref 6–20)
CO2: 25 mmol/L (ref 22–32)
CREATININE: 0.68 mg/dL (ref 0.44–1.00)
Calcium: 9.4 mg/dL (ref 8.9–10.3)
Chloride: 101 mmol/L (ref 101–111)
GFR calc Af Amer: >60 mL/min (ref >60)
Glucose, Bld: 90 mg/dL (ref 65–99)
POTASSIUM: 3.4 mmol/L — AB (ref 3.5–5.1)
SODIUM: 135 mmol/L (ref 135–145)

## 2016-05-04 LAB — CBC WITH DIFFERENTIAL/PLATELET
BASOS PCT: 0 %
Basophils Absolute: 0 10*3/uL (ref 0.0–0.1)
EOS ABS: 0 10*3/uL (ref 0.0–0.7)
EOS PCT: 0 %
HCT: 37.2 % (ref 36.0–46.0)
Hemoglobin: 13.2 g/dL (ref 12.0–15.0)
LYMPHS ABS: 2.2 10*3/uL (ref 0.7–4.0)
Lymphocytes Relative: 31 %
MCH: 30.6 pg (ref 26.0–34.0)
MCHC: 35.5 g/dL (ref 30.0–36.0)
MCV: 86.3 fL (ref 78.0–100.0)
Monocytes Absolute: 0.6 10*3/uL (ref 0.1–1.0)
Monocytes Relative: 9 %
Neutro Abs: 4.3 10*3/uL (ref 1.7–7.7)
Neutrophils Relative %: 60 %
PLATELETS: 194 10*3/uL (ref 150–400)
RBC: 4.31 MIL/uL (ref 3.87–5.11)
RDW: 12.3 % (ref 11.5–15.5)
WBC: 7.1 10*3/uL (ref 4.0–10.5)

## 2016-05-04 LAB — MAGNESIUM: MAGNESIUM: 1.9 mg/dL (ref 1.7–2.4)

## 2016-05-04 MED ORDER — POTASSIUM CHLORIDE CRYS ER 20 MEQ PO TBCR
40.0000 meq | EXTENDED_RELEASE_TABLET | Freq: Once | ORAL | Status: AC
  Administered 2016-05-04: 40 meq via ORAL
  Filled 2016-05-04: qty 2

## 2016-05-04 NOTE — ED Notes (Signed)
Patient transported to X-ray 

## 2016-05-04 NOTE — ED Provider Notes (Signed)
[facility]EMERGENCY DEPT Provider Note   CSN: [id number] Arrival date & time: 05/04/16  0422     History   Chief Complaint Chief Complaint  Patient presents with  . Shortness of Breath  . Dizziness    HPI Erica Ho. Creely is a 35 y.o. female PMH of PE on xarelto, presenting with CP.  She states she woke up with CP around 2am.  It was a sharp non radiating pain on the L side.  She denies any SOB, vomiting, or diaphoresis.  It has now self resolved and she denies any CP at this time.  She has been compliant with her xarelto medication since being diagnosed with PE a few days ago.  She also felt like her heart was racing tonight.  She states she may have mild dehydration and her mouth feels dry.  She denies any recent fever, coughing, or sick contacts.  There are no further complaints.  10 Systems reviewed and are negative for acute change except as noted in the HPI.   HPI  Past Medical History:  Diagnosis Date  . History of migraine headaches   . PE (pulmonary thromboembolism) (HCC)   . Varicose veins     Patient Active Problem List   Diagnosis Date Noted  . Acute pulmonary embolism (HCC) 05/03/2016    Past Surgical History:  Procedure Laterality Date  . CESAREAN SECTION    . CHOLECYSTECTOMY    . TONSILLECTOMY    . TUBAL LIGATION      OB History    No data available       Home Medications    Prior to Admission medications   Medication Sig Start Date End Date Taking? Authorizing Provider  esomeprazole (NEXIUM) 20 MG capsule Take 20 mg by mouth daily at 12 noon.   Yes Historical Provider, MD  Rivaroxaban 15 & 20 MG TBPK Take as directed on package: Start with one 15mg  tablet by mouth twice a day with food. On Day 22, switch to one 20mg  tablet once a day with food. 05/03/16  Yes Joseph Art, DO    Family History Family History  Problem Relation Age of Onset  . Clotting disorder Neg Hx     Social History Social History  Substance Use Topics  . Smoking status:  Former Smoker    Quit date: 08/26/2011  . Smokeless tobacco: Never Used  . Alcohol use No     Allergies   Imitrex [sumatriptan]; Levaquin [levofloxacin in d5w]; and Levofloxacin   Review of Systems Review of Systems   Physical Exam Updated Vital Signs BP 124/76   Pulse 75   Resp 15   LMP 04/20/2016   SpO2 100%   Physical Exam  Constitutional: She is oriented to person, place, and time. She appears well-developed and well-nourished. No distress.  HENT:  Head: Normocephalic and atraumatic.  Nose: Nose normal.  Mouth/Throat: Oropharynx is clear and moist. No oropharyngeal exudate.  Eyes: Conjunctivae and EOM are normal. Pupils are equal, round, and reactive to light. No scleral icterus.  Neck: Normal range of motion. Neck supple. No JVD present. No tracheal deviation present. No thyromegaly present.  Cardiovascular: Normal rate, regular rhythm and normal heart sounds.  Exam reveals no gallop and no friction rub.   No murmur heard. Pulmonary/Chest: Effort normal and breath sounds normal. No respiratory distress. She has no wheezes. She exhibits no tenderness.  Abdominal: Soft. Bowel sounds are normal. She exhibits no distension and no mass. There is no tenderness. There is  no rebound and no guarding.  Musculoskeletal: Normal range of motion. She exhibits no edema or tenderness.  TED stocking to LLE  Lymphadenopathy:    She has no cervical adenopathy.  Neurological: She is alert and oriented to person, place, and time. No cranial nerve deficit. She exhibits normal muscle tone.  Skin: Skin is warm and dry. No rash noted. No erythema. No pallor.  Nursing note and vitals reviewed.    ED Treatments / Results  Labs (all labs ordered are listed, but only abnormal results are displayed) Labs Reviewed  BASIC METABOLIC PANEL - Abnormal; Notable for the following:       Result Value   Potassium 3.4 (*)    All other components within normal limits  CBC WITH DIFFERENTIAL/PLATELET    MAGNESIUM    EKG  EKG Interpretation  Date/Time:  Tuesday May 04 2016 04:25:40 EST Ventricular Rate:  102 PR Interval:  140 QRS Duration: 86 QT Interval:  344 QTC Calculation: 448 R Axis:   92 Text Interpretation:  Sinus tachycardia Rightward axis ST & T wave abnormality, consider inferior ischemia Abnormal ECG TWI in inf leads new since last tracing Confirmed by Erroll Lunani, Anyelo Mccue Ayokunle [phone]([id number]) on 05/04/2016 5:46:03 AM       Radiology Dg Chest 2 View  Result Date: 05/04/2016 CLINICAL DATA:  Chest pain and dyspnea this morning EXAM: CHEST  2 VIEW COMPARISON:  05/02/2016 CT FINDINGS: The heart size and mediastinal contours are within normal limits. Both lungs are clear. The visualized skeletal structures are unremarkable. IMPRESSION: No active cardiopulmonary disease. Electronically Signed   By: Ellery Plunkaniel R Mitchell M.D.   On: 05/04/2016 05:47   Ct Angio Chest Pe W And/or Wo Contrast  Result Date: 05/02/2016 CLINICAL DATA:  Palpitations. EXAM: CT ANGIOGRAPHY CHEST WITH CONTRAST TECHNIQUE: Multidetector CT imaging of the chest was performed using the standard protocol during bolus administration of intravenous contrast. Multiplanar CT image reconstructions and MIPs were obtained to evaluate the vascular anatomy. CONTRAST:  100 cc Isovue 370 intravenous COMPARISON:  None. FINDINGS: Cardiovascular: There is a branching subsegmental filling defect in the medial basal segment left lower lobe consistent with acute embolism. No other embolism is noted. Normal heart size.  No pericardial Mediastinum/Nodes: Negative for adenopathy or mass. Lungs/Pleura: There is no edema, consolidation, effusion, or pneumothorax. Average 3 mm pulmonary nodule in the right lower lobe, 6:85. Upper Abdomen: Cholecystectomy. Musculoskeletal: No acute or aggressive finding Critical Value/emergent results were called by telephone at the time of interpretation on 05/02/2016 at 9:32 pm to Dr. Wynetta EmeryNICOLE PISCIOTTA , who verbally  acknowledged these results. Review of the MIP images confirms the above findings. IMPRESSION: 1. Positive for single subsegmental acute pulmonary embolism in the left lower lobe. 2. 3 mm right lower lobe pulmonary nodule. No follow-up needed if patient is low-risk. Non-contrast chest CT can be considered in 12 months if patient is high-risk. This recommendation follows the consensus statement: Guidelines for Management of Incidental Pulmonary Nodules Detected on CT Images: From the Fleischner Society 2017; Radiology 2017; 284:228-243. Electronically Signed   By: Marnee SpringJonathon  Watts M.D.   On: 05/02/2016 21:35    Procedures Procedures (including critical care time)  Medications Ordered in ED Medications  potassium chloride SA (K-DUR,KLOR-CON) CR tablet 40 mEq (not administered)     Initial Impression / Assessment and Plan / ED Course  I have reviewed the triage vital signs and the nursing notes.  Pertinent labs & imaging results that were available during my care of the patient were  reviewed by me and considered in my medical decision making (see chart for details).  Clinical Course     Patient presents to the ED for CP. Her history is not consistent with ACS.  There was no exertional component, associated factors, or radiation of the pain.  She is currently on the appropriate treatment for PE and was advised to continue.  Will obtain labs and CXR for evaluation.  EKG shows new TWI in inferior leads which is consistent with PE diagnosis as well.  She continues to appear well and is asymptomatic.  Potassium was replaced in the ED back to normal.  VS remain within her normal limits and she is safe for DC.  Final Clinical Impressions(s) / ED Diagnoses   Final diagnoses:  Atypical chest pain    New Prescriptions New Prescriptions   No medications on file     Tomasita CrumbleAdeleke Burnette Valenti, MD 05/04/16 [phone]

## 2016-05-04 NOTE — ED Notes (Signed)
ED Provider at bedside. 

## 2016-05-04 NOTE — ED Triage Notes (Signed)
Pt recently discharged from the hospital for a PE. Pt has taken one dose of her Xarelto, was woken up tonight around 0230 with dizziness. Pt ambulatory without difficulty. C/o shortness of breath which is baseline from discharge.

## 2016-05-05 ENCOUNTER — Emergency Department (HOSPITAL_COMMUNITY): Payer: BC Managed Care – PPO

## 2016-05-05 ENCOUNTER — Emergency Department (HOSPITAL_COMMUNITY)
Admission: EM | Admit: 2016-05-05 | Discharge: 2016-05-05 | Disposition: A | Discharge status: Home / Self Care | Payer: BC Managed Care – PPO | Attending: Emergency Medicine | Admitting: Emergency Medicine

## 2016-05-05 ENCOUNTER — Encounter (HOSPITAL_COMMUNITY): Payer: Self-pay | Admitting: *Deleted

## 2016-05-05 DIAGNOSIS — Z7901 Long term (current) use of anticoagulants: Secondary | ICD-10-CM | POA: Insufficient documentation

## 2016-05-05 DIAGNOSIS — R079 Chest pain, unspecified: Secondary | ICD-10-CM | POA: Diagnosis present

## 2016-05-05 DIAGNOSIS — I2699 Other pulmonary embolism without acute cor pulmonale: Secondary | ICD-10-CM | POA: Insufficient documentation

## 2016-05-05 DIAGNOSIS — Z87891 Personal history of nicotine dependence: Secondary | ICD-10-CM | POA: Diagnosis not present

## 2016-05-05 LAB — BASIC METABOLIC PANEL
Anion gap: 14 (ref 5–15)
BUN: 8 mg/dL (ref 6–20)
CHLORIDE: 104 mmol/L (ref 101–111)
CO2: 22 mmol/L (ref 22–32)
Calcium: 10.4 mg/dL — ABNORMAL HIGH (ref 8.9–10.3)
Creatinine, Ser: 0.72 mg/dL (ref 0.44–1.00)
GFR calc Af Amer: >60 mL/min (ref >60)
GLUCOSE: 141 mg/dL — AB (ref 65–99)
POTASSIUM: 4.3 mmol/L (ref 3.5–5.1)
Sodium: 140 mmol/L (ref 135–145)

## 2016-05-05 LAB — PROTIME-INR
INR: 1.22
Prothrombin Time: 15.5 seconds — ABNORMAL HIGH (ref 11.4–15.2)

## 2016-05-05 LAB — CBC
HCT: 40.1 % (ref 36.0–46.0)
Hemoglobin: 14.1 g/dL (ref 12.0–15.0)
MCH: 30.6 pg (ref 26.0–34.0)
MCHC: 35.2 g/dL (ref 30.0–36.0)
MCV: 87 fL (ref 78.0–100.0)
Platelets: 203 10*3/uL (ref 150–400)
RBC: 4.61 MIL/uL (ref 3.87–5.11)
RDW: 12.4 % (ref 11.5–15.5)
WBC: 7.3 10*3/uL (ref 4.0–10.5)

## 2016-05-05 LAB — I-STAT BETA HCG BLOOD, ED (MC, WL, AP ONLY): I-stat hCG, quantitative: <5 m[IU]/mL (ref <5)

## 2016-05-05 LAB — I-STAT TROPONIN, ED: TROPONIN I, POC: 0 ng/mL (ref 0.00–0.08)

## 2016-05-05 NOTE — Discharge Instructions (Signed)
Follow-up with her cardiologist as planned. Continued to take the blood thinner.

## 2016-05-05 NOTE — ED Notes (Signed)
Pt aware that she is waiting on a room pt is concerned about b/p bouncing all around no other complaints noted at this time

## 2016-05-05 NOTE — ED Provider Notes (Signed)
[facility]PT Provider Note   CSN: [id number] Arrival date & time: 05/05/16  1804     History   Chief Complaint Chief Complaint  Patient presents with  . Chest Pain  . Shortness of Breath  . Tachycardia    HPI Erica Ho is a 35 y.o. female.  HPI Patient presents with chest pain shortness of breath and tachycardia. States she feels her heart flip and flop and then it'll go fast. States she's had this before and she was recently diagnosed pulmonary embolism. She was seen again yesterday for chest pain. Diagnosis of the pulmonary embolism was around 4 days ago. Pain is on the left side of her chest and goes to her back. States she is anxious or happens. She is taking her Xarelto was planned. She was having episodes of tachycardia before the pulmonary embolus was diagnosed. She's had a negative TSH. She's had a swelling in her left leg also. No fevers or chills. No cough. No hemoptysis. The pain is sharp. She states she has follow-up with her cardiologist for further monitoring and echocardiogram.   Past Medical History:  Diagnosis Date  . History of migraine headaches   . PE (pulmonary thromboembolism) (HCC)   . Varicose veins     Patient Active Problem List   Diagnosis Date Noted  . Acute pulmonary embolism (HCC) 05/03/2016    Past Surgical History:  Procedure Laterality Date  . CESAREAN SECTION    . CHOLECYSTECTOMY    . TONSILLECTOMY    . TUBAL LIGATION      OB History    No data available       Home Medications    Prior to Admission medications   Medication Sig Start Date End Date Taking? Authorizing Provider  esomeprazole (NEXIUM) 20 MG capsule Take 20 mg by mouth daily at 12 noon.   Yes Historical Provider, MD  Rivaroxaban 15 & 20 MG TBPK Take as directed on package: Start with one 15mg  tablet by mouth twice a day with food. On Day 22, switch to one 20mg  tablet once a day with food. Patient taking differently: Take 15 mg by mouth 2 (two) times  daily. Take as directed on package: Start with one 15mg  tablet by mouth twice a day with food. On Day 22, switch to one 20mg  tablet once a day with food. 05/03/16  Yes Joseph ArtJessica U Vann, DO    Family History Family History  Problem Relation Age of Onset  . Clotting disorder Neg Hx     Social History Social History  Substance Use Topics  . Smoking status: Former Smoker    Quit date: 08/26/2011  . Smokeless tobacco: Never Used  . Alcohol use No     Allergies   Imitrex [sumatriptan]; Levaquin [levofloxacin in d5w]; and Levofloxacin   Review of Systems Review of Systems  Constitutional: Negative for appetite change.  HENT: Negative for congestion.   Respiratory: Positive for shortness of breath. Negative for cough.   Cardiovascular: Positive for chest pain and palpitations.  Gastrointestinal: Negative for abdominal pain.  Genitourinary: Negative for dysuria.  Musculoskeletal: Negative for back pain.  Neurological: Negative for numbness.  Psychiatric/Behavioral: Negative for confusion.     Physical Exam Updated Vital Signs BP 110/80   Pulse 84   Temp 98 F (36.7 C) (Oral)   Resp (!) 28   LMP 04/20/2016   SpO2 100%   Physical Exam  Constitutional: She appears well-developed.  HENT:  Head: Atraumatic.  Eyes: EOM are normal.  Cardiovascular: Normal rate.   Pulmonary/Chest: Effort normal. No respiratory distress. She exhibits no tenderness.  Abdominal: There is no tenderness.  Musculoskeletal: She exhibits edema.  Mild edema of left lower leg. She has pressure stockings on.  Neurological: She is alert.  Skin: Skin is warm. Capillary refill takes less than 2 seconds.     ED Treatments / Results  Labs (all labs ordered are listed, but only abnormal results are displayed) Labs Reviewed  BASIC METABOLIC PANEL - Abnormal; Notable for the following:       Result Value   Glucose, Bld 141 (*)    Calcium 10.4 (*)    All other components within normal limits    PROTIME-INR - Abnormal; Notable for the following:    Prothrombin Time 15.5 (*)    All other components within normal limits  CBC  I-STAT TROPOININ, ED  I-STAT BETA HCG BLOOD, ED ([facility], [facility], [facility] ONLY)    EKG  EKG Interpretation  Date/Time:  Wednesday May 05 2016 18:11:28 EST Ventricular Rate:  115 PR Interval:  134 QRS Duration: 86 QT Interval:  314 QTC Calculation: 434 R Axis:   90 Text Interpretation:  Sinus tachycardia Rightward axis Borderline ECG Confirmed by Rubin PayorPICKERING  MD, Davia Smyre [phone]([id number]) on 05/05/2016 9:54:22 PM       Radiology Dg Chest 2 View  Result Date: 05/05/2016 CLINICAL DATA:  Tachycardia. Shortness breath. Chest pain. Recent diagnosis of subsegmental pulmonary embolus. EXAM: CHEST  2 VIEW COMPARISON:  None. FINDINGS: The heart size is normal. The lungs are clear. The visualized soft tissues and bony thorax are unremarkable. IMPRESSION: Negative two view chest x-ray Electronically Signed   By: Marin Robertshristopher  Mattern M.D.   On: 05/05/2016 18:40   Dg Chest 2 View  Result Date: 05/04/2016 CLINICAL DATA:  Chest pain and dyspnea this morning EXAM: CHEST  2 VIEW COMPARISON:  05/02/2016 CT FINDINGS: The heart size and mediastinal contours are within normal limits. Both lungs are clear. The visualized skeletal structures are unremarkable. IMPRESSION: No active cardiopulmonary disease. Electronically Signed   By: Ellery Plunkaniel R Mitchell M.D.   On: 05/04/2016 05:47    Procedures Procedures (including critical care time)  Medications Ordered in ED Medications - No data to display   Initial Impression / Assessment and Plan / ED Course  I have reviewed the triage vital signs and the nursing notes.  Pertinent labs & imaging results that were available during my care of the patient were reviewed by me and considered in my medical decision making (see chart for details).  Clinical Course     Patient with chest pain tachycardia. Recent small subsegmental pulmonary embolism. On  Xarelto. She was initially tachycardic here but that is resolved. States when the EKG was done and she felt her heart going fast but not the flopping that she been feeling before. Labs reassuring. Negative troponin. EKG reassuring except for the tachycardia. Doubt ischemic cause of this. Doubt worsening pulmonary embolism. I think rescanning of the chest is not necessary at this time. Had long discussions with the patient. Follow with her cardiologist. May be an anxiety component to this. Will discharge home.  Final Clinical Impressions(s) / ED Diagnoses   Final diagnoses:  Chest pain, unspecified type  Other acute pulmonary embolism without acute cor pulmonale (HCC)    New Prescriptions New Prescriptions   No medications on file     Benjiman CoreNathan Zyaire Mccleod, MD 05/05/16 2215

## 2016-05-05 NOTE — ED Triage Notes (Signed)
Pt reports recent diagnosis of PE. Having dizziness, SOB, left side chest pain that radiates into her back. Appears very anxious at triage.

## 2016-05-13 ENCOUNTER — Emergency Department (HOSPITAL_COMMUNITY): Payer: BC Managed Care – PPO

## 2016-05-13 ENCOUNTER — Emergency Department (HOSPITAL_COMMUNITY)
Admission: EM | Admit: 2016-05-13 | Discharge: 2016-05-13 | Disposition: A | Discharge status: Home / Self Care | Payer: BC Managed Care – PPO | Attending: Emergency Medicine | Admitting: Emergency Medicine

## 2016-05-13 ENCOUNTER — Encounter (HOSPITAL_COMMUNITY): Payer: Self-pay

## 2016-05-13 DIAGNOSIS — Z7901 Long term (current) use of anticoagulants: Secondary | ICD-10-CM | POA: Insufficient documentation

## 2016-05-13 DIAGNOSIS — R0789 Other chest pain: Secondary | ICD-10-CM | POA: Diagnosis not present

## 2016-05-13 DIAGNOSIS — R0602 Shortness of breath: Secondary | ICD-10-CM | POA: Diagnosis present

## 2016-05-13 DIAGNOSIS — Z87891 Personal history of nicotine dependence: Secondary | ICD-10-CM | POA: Insufficient documentation

## 2016-05-13 DIAGNOSIS — Z79899 Other long term (current) drug therapy: Secondary | ICD-10-CM | POA: Diagnosis not present

## 2016-05-13 LAB — I-STAT CHEM 8, ED
BUN: 8 mg/dL (ref 6–20)
CHLORIDE: 102 mmol/L (ref 101–111)
CREATININE: 0.6 mg/dL (ref 0.44–1.00)
Calcium, Ion: 1.11 mmol/L — ABNORMAL LOW (ref 1.15–1.40)
GLUCOSE: 87 mg/dL (ref 65–99)
HCT: 34 % — ABNORMAL LOW (ref 36.0–46.0)
Hemoglobin: 11.6 g/dL — ABNORMAL LOW (ref 12.0–15.0)
POTASSIUM: 3.5 mmol/L (ref 3.5–5.1)
Sodium: 140 mmol/L (ref 135–145)
TCO2: 25 mmol/L (ref 0–100)

## 2016-05-13 MED ORDER — IOPAMIDOL (ISOVUE-370) INJECTION 76%
INTRAVENOUS | Status: AC
  Administered 2016-05-13: 100 mL
  Filled 2016-05-13: qty 100

## 2016-05-13 NOTE — ED Triage Notes (Signed)
Pt presents to the ed with ems, she was diagnosed with a pulmonary embolism on 05/02/2016 and is taking warfarin, she was at work today and got dizzy and had sudden pain in her back at her left shoulder blade that felt like the symptoms she had when she was diagnosed with a pe, on arrival to the ed all symptoms have subsided.

## 2016-05-13 NOTE — ED Provider Notes (Signed)
[facility]-EMERGENCY DEPT Provider Note   CSN: [id number] Arrival date & time: 05/13/16  1322     History   Chief Complaint Chief Complaint  Patient presents with  . Back Pain  . Shortness of Breath    HPI Erica Ho is a 35 y.o. female.  HPI Erica Ho is a 35 y.o. female with history of migraine headaches, PE, presents to emergency department complaining of chest pain. 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  Pain was associated with dizziness. Denies any shortness of breath. States had similar pains yesterday at home. By the time patient came to the ED pain has subsided. Patient was diagnosed with PE on 05/02/2016. Since then she has been to our ED 3 times, has been seen by her primary care doctor 4 times and has seen cardiologist on 05/04/2016. Has had an echo and currently on holter monitor. She was last seen by her doctor 2 days ago and was seen in ED 6 days ago. Patient states she is frustrated that the pain is worsening and is concerned she may have another clot. Denies any new swelling in her extremities. 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 She denies any other associated symptoms or complaints at this time.  Past Medical History:  Diagnosis Date  . History of migraine headaches   . PE (pulmonary thromboembolism) (HCC)   . Varicose veins     Patient Active Problem List   Diagnosis Date Noted  . Acute pulmonary embolism (HCC) 05/03/2016    Past Surgical History:  Procedure Laterality Date  . CESAREAN SECTION    . CHOLECYSTECTOMY    . TONSILLECTOMY    . TUBAL LIGATION      OB History    No data available       Home Medications    Prior to Admission medications   Medication Sig Start Date End Date Taking? Authorizing Provider  esomeprazole (NEXIUM) 20  MG capsule Take 20 mg by mouth daily at 12 noon.    Historical Provider, MD  Rivaroxaban 15 & 20 MG TBPK Take as directed on package: Start with one 15mg  tablet by mouth twice a day with food. On Day 22, switch to one 20mg  tablet once a day with food. Patient taking differently: Take 15 mg by mouth 2 (two) times daily. Take as directed on package: Start with one 15mg  tablet by mouth twice a day with food. On Day 22, switch to one 20mg  tablet once a day with food. 05/03/16   Joseph ArtJessica U Vann, DO    Family History Family History  Problem Relation Age of Onset  . Clotting disorder Neg Hx     Social History Social History  Substance Use Topics  . Smoking status: Former Smoker    Quit date: 08/26/2011  . Smokeless tobacco: Never Used  . Alcohol use No     Allergies   Imitrex [sumatriptan]; Levaquin [levofloxacin in d5w]; and Levofloxacin   Review of Systems Review of Systems  Constitutional: Negative for chills and fever.  Respiratory: Positive for chest tightness. Negative for cough and shortness of breath.   Cardiovascular: Positive for chest pain. Negative for palpitations and leg swelling.  Gastrointestinal: Negative for abdominal pain, diarrhea, nausea and vomiting.  Genitourinary: Negative for dysuria,  flank pain, pelvic pain, vaginal bleeding, vaginal discharge and vaginal pain.  Musculoskeletal: Negative for arthralgias, myalgias, neck pain and neck stiffness.  Skin: Negative for rash.  Neurological: Positive for dizziness and light-headedness. Negative for weakness and headaches.  All other systems reviewed and are negative.    Physical Exam Updated Vital Signs BP 111/85 (BP Location: Right Arm)   Pulse 93   Temp 98 F (36.7 C) (Oral)   Resp 18   Ht 5\' 1"  (1.549 m)   Wt 67.1 kg   LMP 05/13/2016 (Exact Date)   SpO2 94%   BMI 27.96 kg/m   Physical Exam  Constitutional: She appears well-developed and well-nourished. No distress.  HENT:  Head: Normocephalic.    Eyes: Conjunctivae are normal.  Neck: Neck supple.  Cardiovascular: Normal rate, regular rhythm and normal heart sounds.   Pulmonary/Chest: Effort normal and breath sounds normal. No respiratory distress. She has no wheezes. She has no rales. She exhibits no tenderness.  Abdominal: Soft. Bowel sounds are normal. She exhibits no distension. There is no tenderness. There is no rebound.  No CVA tenderness bilaterally  Musculoskeletal: She exhibits no edema.  Neurological: She is alert.  Skin: Skin is warm and dry.  Psychiatric: She has a normal mood and affect. Her behavior is normal.  Nursing note and vitals reviewed.    ED Treatments / Results  Labs (all labs ordered are listed, but only abnormal results are displayed) Labs Reviewed - No data to display  EKG  EKG Interpretation None       Radiology No results found.  Procedures Procedures (including critical care time)  Medications Ordered in ED Medications - No data to display   Initial Impression / Assessment and Plan / ED Course  I have reviewed the triage vital signs and the nursing notes.  Pertinent labs & imaging results that were available during my care of the patient were reviewed by me and considered in my medical decision making (see chart for details).  Clinical Course    Patient to the emergency department recurrent episodes of chest pain, states worse than before. Recent diagnosis of single subsegmental acute pulmonary embolism in the left lower lobe on CT angiogram chest 05/02/16. Patient is very anxious, exam reassuring. Normal vital signs. Discussed with Dr. Rubin PayorPickering, will get CT angio of the chest for recheck for worsening clots. Pt agrees to the plan. Istat chem 8 and CT angio ordered.   Vitals:   05/13/16 1332 05/13/16 1430  BP: 111/85 104/63  Pulse: 93 92  Resp: 18 15  Temp: 98 F (36.7 C)   TempSrc: Oral   SpO2: 94% 100%  Weight: 67.1 kg   Height: 5\' 1"  (1.549 m)    3:47 PM Pt signed  out to Dr. Anitra LauthPlunkett at shift change pending CT.      Final Clinical Impressions(s) / ED Diagnoses   Final diagnoses:  None    New Prescriptions New Prescriptions   No medications on file     Jaynie Crumbleatyana Keanon Bevins, PA-C 05/15/16 1655    Benjiman CoreNathan Pickering, MD 05/16/16 2118

## 2016-05-14 NOTE — ED Provider Notes (Signed)
Resumed care from Dr. Rubin PayorPickering.  CT of the chest shows no new or worsening PE and almost complete resolution of prior PE. Patient has no objective findings to explain her palpitations or anxiety.  Findings were discussed with the patient and her husband. She has follow-up with cardiology and PCP.   Gwyneth SproutWhitney Kyran Connaughton, MD 05/14/16 [phone]

## 2016-05-17 DIAGNOSIS — D649 Anemia, unspecified: Secondary | ICD-10-CM | POA: Insufficient documentation

## 2016-05-19 DIAGNOSIS — F410 Panic disorder [episodic paroxysmal anxiety] without agoraphobia: Secondary | ICD-10-CM | POA: Insufficient documentation

## 2016-05-20 NOTE — Progress Notes (Signed)
Cardiology Office Note   Date:  05/21/2016   ID:  Erica Ho, DOB 11/28/1980, MRN [id number]  PCP:  Dina RichUGH,ROBERT, MD  Cardiologist:   Rollene RotundaJames Elisama Thissen, MD  Referring:  Dina RichUGH,ROBERT, MD  Chief Complaint  Patient presents with  . Dizziness      History of Present Illness: Erica Ho is a 35 y.o. female who presents for evaluation of dizziness. She has a complicated history. [facility]. In January her heart racing. She had 30 day event monitor was found has a niece PACs but no symptomatic dysrhythmias. There was sinus tachycardia noted. She did have an echocardiogram apparently was normal. In November she had loss of vision. She had an evaluation to include a CT which was essentially unremarkable. She is seeing neurology and an MRI was done today. In December she had shortness of breath and some chest discomfort and had a pulmonary embolism documented. She's been on anticoagulation. She saw cardiology and recently has question of atrial fibrillation though no document. He is wearing an event monitor. She has superficial thrombosis but is never been found to have a DVT. She's had no clotting disorders documented. She denies any chest pressure, neck or arm discomfort. She does feel the dizziness and doesn't describe vertiginous symptoms with the room spinning. It's hard for her to quantify or qualify this. She's not really having any PND or orthopnea though she has dyspnea prior to her diagnosis of pulmonary embolism.   Past Medical History:  Diagnosis Date  . History of migraine headaches   . PE (pulmonary thromboembolism) (HCC)   . Varicose veins     Past Surgical History:  Procedure Laterality Date  . CESAREAN SECTION    . CHOLECYSTECTOMY    . TONSILLECTOMY    . TUBAL LIGATION       Current Outpatient Prescriptions  Medication Sig Dispense Refill  . ALPRAZolam (XANAX) 0.25 MG tablet Take 0.25 mg by mouth as needed.    . Rivaroxaban 15 & 20 MG  TBPK Take as directed on package: Start with one 15mg  tablet by mouth twice a day with food. On Day 22, switch to one 20mg  tablet once a day with food. (Patient taking differently: Take 15 mg by mouth 2 (two) times daily. Take as directed on package: Start with one 15mg  tablet by mouth twice a day with food. On Day 22, switch to one 20mg  tablet once a day with food) 51 each 0   No current facility-administered medications for this visit.     Allergies:   Imitrex [sumatriptan]; Isovue [iopamidol]; Levofloxacin; Levaquin [levofloxacin in d5w]; and Tape    Social History:  The patient  reports that she quit smoking about 4 years ago. She has never used smokeless tobacco. She reports that she does not drink alcohol or use drugs.   Family History:  The patient's family history includes Diabetes in her mother.   She's not aware of her father's history as she was adopted by her maternal grandparents.   ROS:  Please see the history of present illness.   Otherwise, review of systems are positive for none.   All other systems are reviewed and negative.    PHYSICAL EXAM: VS:  BP 114/78   Pulse 86   Ht 5\' 1"  (1.549 m)   Wt 150 lb (68 kg)   LMP 05/13/2016 (Exact Date)   BMI 28.34 kg/m  , BMI Body mass index is 28.34 kg/m. GENERAL:  Well appearing  HEENT:  Pupils equal round and reactive, fundi not visualized, oral mucosa unremarkable NECK:  No jugular venous distention, waveform within normal limits, carotid upstroke brisk and symmetric, no bruits, no thyromegaly LYMPHATICS:  No cervical, inguinal adenopathy LUNGS:  Clear to auscultation bilaterally BACK:  No CVA tenderness CHEST:  Unremarkable HEART:  PMI not displaced or sustained,S1 and S2 within normal limits, no S3, no S4, no clicks, no rubs, no murmurs ABD:  Flat, positive bowel sounds normal in frequency in pitch, no bruits, no rebound, no guarding, no midline pulsatile mass, no hepatomegaly, no splenomegaly EXT:  2 plus pulses throughout,  no edema, no cyanosis no clubbing SKIN:  No rashes no nodules NEURO:  Cranial nerves II through XII grossly intact, motor grossly intact throughout PSYCH:  Cognitively intact, oriented to person place and time    EKG:  EKG is ordered today. The ekg ordered 12/14/17demonstrates sinus rhythm, rate 93, axis within normal limits, intervals within the limits, no acute ST wave changes.   Recent Labs: 05/04/2016: Magnesium 1.9 05/05/2016: Platelets 203 05/13/2016: BUN 8; Creatinine, Ser 0.60; Hemoglobin 11.6; Potassium 3.5; Sodium 140    Lipid Panel No results found for: CHOL, TRIG, HDL, CHOLHDL, VLDL, LDLCALC, LDLDIRECT    Wt Readings from Last 3 Encounters:  05/21/16 150 lb (68 kg)  05/13/16 148 lb (67.1 kg)  05/03/16 148 lb 14.4 oz (67.5 kg)      Other studies Reviewed: Additional studies/ records that were reviewed today include: Extensive [facility] and ED record. Review of the above records demonstrates:  Please see elsewhere in the note.     ASSESSMENT AND PLAN:   PE:  The patient appears to have an unprovoked pulmonary embolism. She's tolerating Xarelto. She and I will have a discussion about whether she continues anticoagulation after the prerequisite 3-6 months. We began the conversation today.  ARRHYTHMIA:  I have suggested that she continue wearing the event monitor. I have also suggested and [employer] monitor that she should purchase. At this point there is no suggestion of atrial fibrillation. I doubt this is contributing to her dizziness.  I will be checking a TSH.  DIZZINESS:  She will continue with her neurologic evaluation. I think this, the loss of vision, migraines all need to be evaluated from a vascular/neurologic/inflammatory you point. I will follow along with allergy workup.  Current medicines are reviewed at length with the patient today.  The patient does not have concerns regarding medicines.  The following changes have been made:  no change  Labs/ tests  ordered today include:   Orders Placed This Encounter  Procedures  . TSH     Disposition:   FU with me in one month.     Signed, Rollene RotundaJames Tarra Pence, MD  05/21/2016 2:43 PM    [facility]

## 2016-05-21 ENCOUNTER — Ambulatory Visit (INDEPENDENT_AMBULATORY_CARE_PROVIDER_SITE_OTHER): Payer: BC Managed Care – PPO | Admitting: Cardiology

## 2016-05-21 ENCOUNTER — Encounter: Payer: Self-pay | Admitting: Cardiology

## 2016-05-21 VITALS — BP 114/78 | HR 86 | Ht 61.0 in | Wt 150.0 lb

## 2016-05-21 DIAGNOSIS — Z79899 Other long term (current) drug therapy: Secondary | ICD-10-CM | POA: Diagnosis not present

## 2016-05-21 DIAGNOSIS — R002 Palpitations: Secondary | ICD-10-CM

## 2016-05-21 DIAGNOSIS — I2699 Other pulmonary embolism without acute cor pulmonale: Secondary | ICD-10-CM | POA: Diagnosis not present

## 2016-05-21 DIAGNOSIS — R5383 Other fatigue: Secondary | ICD-10-CM | POA: Diagnosis not present

## 2016-05-21 LAB — TSH: TSH: 1.68 m[IU]/L

## 2016-05-21 NOTE — Patient Instructions (Addendum)
Medication Instructions:  Continue current medications  Labwork: TSH  Testing/Procedures: None Ordered  Follow-Up: Your physician recommends that you schedule a follow-up appointment in: 1 Month   Any Other Special Instructions Will Be Listed Below (If Applicable).           HAPPY HOLIDAY   If you need a refill on your cardiac medications before your next appointment, please call your pharmacy.

## 2016-05-26 DIAGNOSIS — Z86711 Personal history of pulmonary embolism: Secondary | ICD-10-CM | POA: Insufficient documentation

## 2016-06-20 NOTE — Progress Notes (Signed)
Cardiology Office Note   Date:  06/22/2016   ID:  Erica Ho, DOB 07/07/1980, MRN [id number]  PCP:  Nonnie DoneSLATOSKY,JOHN J., MD  Cardiologist:   Rollene RotundaJames Thailan Sava, MD  Referring:  Nonnie DoneSLATOSKY,JOHN J., MD  No chief complaint on file.     History of Present Illness: Erica Ho is a 36 y.o. female who presents for evaluation of dizziness. She has a complicated history. [facility].  In January of last year her heart was racing. She had 30 day event monitor was found have PACs but no symptomatic dysrhythmias. There was sinus tachycardia noted. She did have an echocardiogram apparently was normal. In November she had loss of vision. She had an evaluation to include a CT which was essentially unremarkable. She is seeing neurology and an MRI late last year. In December she had shortness of breath and some chest discomfort and had a pulmonary embolism documented. She's been on anticoagulation. She saw cardiology after this and had a question of atrial fibrillation though no documentation. She wore an event monitor. She had a superficial thrombosis but was never been found to have a DVT. She's had no clotting disorders documented.    Since I last saw her she was apparently [facility] and had [facility] CT scan because of some chest discomfort but there was no evidence of recurrent PE. I don't have these records. She is also on an event monitor. I do have the report of this but not the tracings. There were PVCs with one for nonsustained run. She denies presyncope or syncope though she does have palpitations. 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 However, she's not had a problem really say she's been on blood thinners.    Past Medical History:  Diagnosis Date  . History of migraine headaches   . PE (pulmonary thromboembolism) (HCC)   . Varicose veins     Past Surgical History:  Procedure Laterality Date  . CESAREAN  SECTION    . CHOLECYSTECTOMY    . TONSILLECTOMY    . TUBAL LIGATION       Current Outpatient Prescriptions  Medication Sig Dispense Refill  . ALPRAZolam (XANAX) 0.25 MG tablet Take 0.25 mg by mouth as needed.    . rivaroxaban (XARELTO) 20 MG TABS tablet Take 20 mg by mouth daily with supper.     No current facility-administered medications for this visit.     Allergies:   Imitrex [sumatriptan]; Isovue [iopamidol]; Levofloxacin; Levaquin [levofloxacin in d5w]; and Tape    ROS:  Please see the history of present illness.   Otherwise, review of systems are positive for none.   All other systems are reviewed and negative.    PHYSICAL EXAM: VS:  BP 121/80   Pulse 89   Ht 5\' 1"  (1.549 m)   Wt 146 lb 9.6 oz (66.5 kg)   BMI 27.70 kg/m  , BMI Body mass index is 27.7 kg/m. GENERAL:  Well appearing NECK:  No jugular venous distention, waveform within normal limits, carotid upstroke brisk and symmetric, no bruits, no thyromegaly LYMPHATICS:  No cervical, inguinal adenopathy LUNGS:  Clear to auscultation bilaterally BACK:  No CVA tenderness CHEST:  Unremarkable HEART:  PMI not displaced or sustained,S1 and S2 within normal limits, no S3, no S4, no clicks, no rubs, no murmurs ABD:  Flat, positive bowel sounds normal in frequency in pitch, no bruits, no rebound, no guarding, no midline pulsatile mass, no  hepatomegaly, no splenomegaly EXT:  2 plus pulses throughout, no edema, no cyanosis no clubbing   EKG:  EKG is not ordered today.   Recent Labs: 05/04/2016: Magnesium 1.9 05/05/2016: Platelets 203 05/13/2016: BUN 8; Creatinine, Ser 0.60; Hemoglobin 11.6; Potassium 3.5; Sodium 140 05/21/2016: TSH 1.68    Lipid Panel No results found for: CHOL, TRIG, HDL, CHOLHDL, VLDL, LDLCALC, LDLDIRECT    Wt Readings from Last 3 Encounters:  06/22/16 146 lb 9.6 oz (66.5 kg)  05/21/16 150 lb (68 kg)  05/13/16 148 lb (67.1 kg)      Other studies Reviewed: Additional studies/ records that  were reviewed today include: Event monitor Review of the above records demonstrates:       ASSESSMENT AND PLAN:   PE:  The patient appears to have an unprovoked pulmonary embolism. She's tolerating Xarelto. She will continue for 6 months. After that I'll have a discussion with her. Current 2012 guidelines would suggest continued use of Xarelto with an unprovoked pulmonary embolism. Thrombophilia testing is not indicated. However, the patient is quite interested in having this testing. She says her daughter has cutaneous lupus and is being evaluated for clotting disorder. She likely would like to know as it might be related to this. I did begin to discuss with her they didn't suggest from the [employer] ext trial that the risk of recurrent pulmonary emboli with an unprovoked first pulmonary emboli if the patient is taken off of anticoagulation after appropriate treatment may be up to 10%. Xarelto has been shown to reduce this to 1.3%. Risk of major or bleeding with Xarelto is 0.7%. Therefore, continued therapy is recommended. She and I again will have this discussion when she comes back in 4 months.  ARRHYTHMIA:  She has a structurally normal heart by echo previously. The palpitations are infrequent and optically problematic. She prefer no treatment.  DIZZINESS:  This seems to be improved.  Current medicines are reviewed at length with the patient today.  The patient does not have concerns regarding medicines.  The following changes have been made:  None  Labs/ tests ordered today include:   None  No orders of the defined types were placed in this encounter.    Disposition:   FU with me in 4 months.   Signed, Rollene Rotunda, MD  06/22/2016 5:17 PM    [facility]

## 2016-06-21 DIAGNOSIS — Z832 Family history of diseases of the blood and blood-forming organs and certain disorders involving the immune mechanism: Secondary | ICD-10-CM | POA: Insufficient documentation

## 2016-06-22 ENCOUNTER — Encounter: Payer: Self-pay | Admitting: Cardiology

## 2016-06-22 ENCOUNTER — Ambulatory Visit (INDEPENDENT_AMBULATORY_CARE_PROVIDER_SITE_OTHER): Payer: BC Managed Care – PPO | Admitting: Cardiology

## 2016-06-22 VITALS — BP 121/80 | HR 89 | Ht 61.0 in | Wt 146.6 lb

## 2016-06-22 DIAGNOSIS — R002 Palpitations: Secondary | ICD-10-CM

## 2016-06-22 DIAGNOSIS — I2699 Other pulmonary embolism without acute cor pulmonale: Secondary | ICD-10-CM

## 2016-06-22 NOTE — Patient Instructions (Signed)
Medication Instructions:  Continue current medications  Labwork: None Ordered  Testing/Procedures: None Ordered  Follow-Up: Your physician wants you to follow-up in: 4 Months. You will receive a reminder letter in the mail two months in advance. If you don't receive a letter, please call our office to schedule the follow-up appointment.   Any Other Special Instructions Will Be Listed Below (If Applicable).   If you need a refill on your cardiac medications before your next appointment, please call your pharmacy.   

## 2016-08-30 ENCOUNTER — Telehealth: Payer: Self-pay | Admitting: Cardiology

## 2016-08-30 LAB — HEPATIC FUNCTION PANEL
ALK PHOS: 51 (ref 25–125)
ALT: 11 (ref 7–35)
AST: 16 (ref 13–35)
BILIRUBIN, TOTAL: 0.4

## 2016-08-30 LAB — BASIC METABOLIC PANEL
BUN: 6 (ref 4–21)
Creatinine: 0.7 (ref 0.5–1.1)
GLUCOSE: 86
Potassium: 4.3 (ref 3.4–5.3)
Sodium: 142 (ref 137–147)

## 2016-08-30 LAB — CBC AND DIFFERENTIAL
HCT: 37 (ref 36–46)
Hemoglobin: 12.9 (ref 12.0–16.0)
Neutrophils Absolute: 5
PLATELETS: 228 (ref 150–399)
WBC: 7.4

## 2016-08-30 LAB — LIPID PANEL
CHOLESTEROL: 173 (ref 0–200)
HDL: 68 (ref 35–70)
LDL Cholesterol: 96
Triglycerides: 46 (ref 40–160)

## 2016-08-30 LAB — TSH: TSH: 1.9 (ref 0.41–5.90)

## 2016-08-30 NOTE — Telephone Encounter (Signed)
Pt have been chest pains,started this morning and lasted until 30 minutes. At present it feels  Like it is aching.She is at [facility] ER now,she wants to see Dr Union Lions asap Dr Atwood Lions does not have any available appts this week,scheduled pt with Theodore Demark for tomorrow. Please call to advise.

## 2016-09-07 ENCOUNTER — Ambulatory Visit: Payer: BC Managed Care – PPO | Admitting: Physician Assistant

## 2016-09-14 ENCOUNTER — Ambulatory Visit (INDEPENDENT_AMBULATORY_CARE_PROVIDER_SITE_OTHER): Payer: BC Managed Care – PPO | Admitting: Physician Assistant

## 2016-09-14 ENCOUNTER — Encounter: Payer: Self-pay | Admitting: Physician Assistant

## 2016-09-14 VITALS — BP 108/72 | HR 76 | Ht 61.0 in | Wt 147.0 lb

## 2016-09-14 DIAGNOSIS — I2699 Other pulmonary embolism without acute cor pulmonale: Secondary | ICD-10-CM | POA: Diagnosis not present

## 2016-09-14 DIAGNOSIS — R079 Chest pain, unspecified: Secondary | ICD-10-CM | POA: Diagnosis not present

## 2016-09-14 NOTE — Progress Notes (Signed)
Cardiology Office Note   Date:  09/14/2016   ID:  Erica Ho, Erica Ho 03-19-1981, MRN [id number]  PCP:  Nonnie Done., MD  Cardiologist:  Dr Antoine Poche 06/22/2016  Theodore Demark, PA-C   Chief Complaint  Patient presents with  . Follow-up    occassional ache in chest, comes and goes    History of Present Illness: Erica Ho is a 36 y.o. 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 Xarelto has been shown to reduce this to 1.3%. Risk of major or bleeding with Xarelto is 0.7%. Continue.  04/02, Pt woke with chest pain, severe pain lower L chest every time she took a deep breath. She went to [facility]. They did some tests and her cardiac enzymes were negative. Cards f/u requested.  She has been having a dull ache in her upper L abdomen off and on for a few months. No association with meals, no known aggravating or alleviating factors.    Erica Ho presents for cardiology follow up.   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 Her Varicose veins bother her at times. She wears compression stockings that help. No hx exertional chest pain. She has to move heavy boxes at work, does so without problems. No LE edema. No orthopnea or PND. No DOE.   She saw a hematologist, he ran some tests and says ok for her to come off Xarelto. She is uncomfortable with this, wants to ask Dr Antoine Poche about it. She is worried that she will get another clot, that thought scares her.   She is worried that her chest pain was coming from her heart.   Past Medical History:  Diagnosis Date  . History of migraine headaches   . PE (pulmonary  thromboembolism) (HCC)   . Varicose veins     Past Surgical History:  Procedure Laterality Date  . CESAREAN SECTION    . CHOLECYSTECTOMY    . TONSILLECTOMY    . TUBAL LIGATION      Current Outpatient Prescriptions  Medication Sig Dispense Refill  . ferrous sulfate 325 (65 FE) MG EC tablet Take 325 mg by mouth daily.    . rivaroxaban (XARELTO) 20 MG TABS tablet Take 20 mg by mouth daily with supper.     No current facility-administered medications for this visit.     Allergies:   Imitrex [sumatriptan]; Isovue [iopamidol]; Levofloxacin; Levaquin [levofloxacin in d5w]; and Tape    Social History:  The patient  reports that she quit smoking about 5 years ago. She has never used smokeless tobacco. She reports that she does not drink alcohol or use drugs.   Family History:  The patient's family history includes Diabetes in her mother.    ROS:  Please see the history of present illness. All other systems are reviewed and negative.    PHYSICAL EXAM: VS:  BP 108/72   Pulse 76   Ht  (1.549 m)   Wt 147 lb (66.7 kg)   BMI 27.78 kg/m  , BMI Body mass index is 27.78 kg/m. GEN: Well nourished, well developed, female in no acute distress  HEENT: normal for  age  Neck: no JVD, no carotid bruit, no masses Cardiac: RRR; no murmur, no rubs, or gallops Respiratory:  clear to auscultation bilaterally, normal work of breathing GI: soft, nontender, nondistended, + BS MS: no deformity or atrophy; no edema; distal pulses are 2+ in all 4 extremities   Skin: warm and dry, no rash Neuro:  Strength and sensation are intact Psych: euthymic mood, full affect   EKG:  EKG is ordered today. The ekg ordered today demonstrates SR, normal intervals, no ischemic changes   Recent Labs: 05/04/2016: Magnesium 1.9 05/05/2016: Platelets 203 05/13/2016: BUN 8; Creatinine, Ser 0.60; Hemoglobin 11.6; Potassium 3.5; Sodium 140 05/21/2016: TSH 1.68    Lipid Panel No results found for: CHOL, TRIG,  HDL, CHOLHDL, VLDL, LDLCALC, LDLDIRECT   Wt Readings from Last 3 Encounters:  09/14/16 147 lb (66.7 kg)  06/22/16 146 lb 9.6 oz (66.5 kg)  05/21/16 150 lb (68 kg)     Other studies Reviewed: Additional studies/ records that were reviewed today include: office notes, hospital records and testing..  ASSESSMENT AND PLAN:  1.  Chest pain: atypical and ez neg MI. ECG not acute. Pt is concerned. Will do POET and f/u on results.   2. PE: pt is reluctant to stop Xarelto, discuss w/ [facility]. Ok for her to hold Xarelto prn for procedures, labs (to ck for Lupus among other things). It would be good to complete the workup, then hopefully, the hematologist and cardiologist could get on the same page.   Current medicines are reviewed at length with the patient today.  The patient does not have concerns regarding medicines.   The following changes have been made:  no change  Labs/ tests ordered today include:  No orders of the defined types were placed in this encounter.    Disposition:   FU with Dr Antoine Poche  Signed, Leanna Battles  09/14/2016 3:44 PM    [facility] Phone: [phone]; Fax: [phone]  This note was written with the assistance of speech recognition software. Please excuse any transcriptional errors.

## 2016-09-14 NOTE — Patient Instructions (Signed)
Medication Instructions:  Your physician recommends that you continue on your current medications as directed. Please refer to the Current Medication list given to you today.  If you need a refill on your cardiac medications before your next appointment, please call your pharmacy.  Testing/Procedures: Your physician has requested that you have an exercise tolerance test-PLEASE SCHEDULE AFTER 2PM. For further information please visit [email or web address] Please also follow instruction sheet, as given.    Follow-Up: Your physician wants you to follow-up in: AFTER POET WITH DR [facility].    Thank you for choosing [facility] at [facility]!!

## 2016-09-15 ENCOUNTER — Telehealth (HOSPITAL_COMMUNITY): Payer: Self-pay

## 2016-09-15 NOTE — Telephone Encounter (Signed)
Encounter complete. 

## 2016-09-16 ENCOUNTER — Ambulatory Visit (HOSPITAL_COMMUNITY)
Admission: RE | Admit: 2016-09-16 | Discharge: 2016-09-16 | Disposition: A | Discharge status: Home / Self Care | Payer: BC Managed Care – PPO | Source: Ambulatory Visit | Attending: Physician Assistant | Admitting: Physician Assistant

## 2016-09-16 ENCOUNTER — Encounter: Payer: Self-pay | Admitting: Cardiology

## 2016-09-16 DIAGNOSIS — R079 Chest pain, unspecified: Secondary | ICD-10-CM

## 2016-09-17 LAB — EXERCISE TOLERANCE TEST
CHL CUP MPHR: 183 {beats}/min
CHL CUP RESTING HR STRESS: 76 {beats}/min
CSEPEDS: 33 s
CSEPEW: 11 METS
CSEPHR: 94 %
CSEPPHR: 173 {beats}/min
Exercise duration (min): 9 min
RPE: 17

## 2016-09-27 ENCOUNTER — Telehealth: Payer: Self-pay | Admitting: Cardiology

## 2016-09-27 NOTE — Telephone Encounter (Signed)
Spoke with pt

## 2016-09-27 NOTE — Telephone Encounter (Signed)
New message    Patient calling stating she returning triage call from today

## 2016-10-05 NOTE — Progress Notes (Signed)
Cardiology Office Note   Date:  10/06/2016   ID:  Erica Ho, Erica Ho Oct 30, 1980, MRN [id number]  PCP:  Nonnie Done., MD  Cardiologist:   Rollene Rotunda, MD  Referring:  Nonnie Done., MD  Chief Complaint  Patient presents with  . Chest Pain      History of Present Illness: Erica Ho. [name] is a 36 y.o. female who presents for evaluation of dizziness. She has a complicated history. She was seen by [facility] cardiology.  In January of last year her heart was racing. She had 30 day event monitor was found have PACs but no symptomatic dysrhythmias. There was sinus tachycardia noted. She did have an echocardiogram and I reviewed these results and it was normal. In November she had loss of vision. She had an evaluation to include a CT which was essentially unremarkable. She was seeing neurology and had an MRI late last year. In December she had shortness of breath and some chest discomfort and had a pulmonary embolism documented. She's been on anticoagulation. She saw cardiology after this and had a question of atrial fibrillation though no documentation. She wore an event monitor. She had a superficial thrombosis but was never been found to have a DVT. She's had no clotting disorders documented.   Prior to the  last time I saw her she was apparently at [facility] and had a CT scan because of some chest discomfort but there was no evidence of recurrent PE.  She was having more discomfort and saw Theodore Demark and had a POET (Plain Old Exercise Treadmill).  This demonstrated no evidence of ischemia.  She recently saw a hematologist who suggest blood work to look for a clotting disorder but he did not want to do this until she is off the Xarelto.  She does not really want to come off of this but is very interested in testing.  Since I last saw her she continues to get some discomfort under her left breast. This is unchanged from previous. Had some rare palpitations. She denies any  substernal chest pressure, neck or arm discomfort. She's had no new shortness of breath, PND or orthopnea.   Past Medical History:  Diagnosis Date  . History of migraine headaches   . PE (pulmonary thromboembolism) (HCC)   . Varicose veins     Past Surgical History:  Procedure Laterality Date  . CESAREAN SECTION    . CHOLECYSTECTOMY    . TONSILLECTOMY    . TUBAL LIGATION       Current Outpatient Prescriptions  Medication Sig Dispense Refill  . ferrous sulfate 325 (65 FE) MG EC tablet Take 325 mg by mouth daily.    . rivaroxaban (XARELTO) 20 MG TABS tablet Take 20 mg by mouth daily with supper.     No current facility-administered medications for this visit.     Allergies:   Imitrex [sumatriptan]; Isovue [iopamidol]; Levofloxacin; Levaquin [levofloxacin in d5w]; and Tape    ROS:  Please see the history of present illness.   Otherwise, review of systems are positive for none.   All other systems are reviewed and negative.    PHYSICAL EXAM: VS:  BP 112/68   Pulse 70   Ht 5\' 1"  (1.549 m)   Wt 144 lb (65.3 kg)   BMI 27.21 kg/m  , BMI Body mass index is 27.21 kg/m. GENERAL:  Well appearing NECK:  No jugular venous distention, waveform within normal limits, carotid upstroke brisk and symmetric, no  bruits, no thyromegaly LUNGS:  Clear to auscultation bilaterally CHEST:  Unremarkable HEART:  PMI not displaced or sustained,S1 and S2 within normal limits, no S3, no S4, no clicks, no rubs, no murmurs ABD:  Flat, positive bowel sounds normal in frequency in pitch, no bruits, no rebound, no guarding, no midline pulsatile mass, no hepatomegaly, no splenomegaly EXT:  2 plus pulses throughout, no edema, no cyanosis no clubbing   EKG:  EKG is not  ordered today.   Recent Labs: 05/04/2016: Magnesium 1.9 05/05/2016: Platelets 203 05/13/2016: BUN 8; Creatinine, Ser 0.60; Hemoglobin 11.6; Potassium 3.5; Sodium 140 05/21/2016: TSH 1.68    Lipid Panel No results found for: CHOL,  TRIG, HDL, CHOLHDL, VLDL, LDLCALC, LDLDIRECT    Wt Readings from Last 3 Encounters:  10/06/16 144 lb (65.3 kg)  09/14/16 147 lb (66.7 kg)  06/22/16 146 lb 9.6 oz (66.5 kg)      Other studies Reviewed: Additional studies/ records that were reviewed today include: Event monitor Review of the above records demonstrates:       ASSESSMENT AND PLAN:   PE:    The patient appears to have an unprovoked pulmonary embolism. She's tolerating Xarelto. Current 2012 guidelines would suggest continued use of Xarelto with an unprovoked pulmonary embolism. Thrombophilia testing is not indicated. However, the patient is quite interested in having this testing. She says her daughter has cutaneous lupus and is being evaluated for clotting disorder.  The patient would like to know if there might be some condition that she has that might be related to her daughter's illness.  Her hematoligst wanted her to come off of the Xarelto.  I discussed with her the results of the EINSTEIN ext trial that suggested the risk of recurrent pulmonary emboli with an unprovoked first pulmonary emboli if the patient is taken off of anticoagulation after appropriate treatment may be up to 10%. Xarelto has been shown to reduce this to 1.3%. Risk of major or bleeding with Xarelto is 0.7%.  I suggest continued therapy.  ARRHYTHMIA:   She has a structurally normal heart by echo previously. The palpitations are infrequent and not particularly problematic. She prefers no treatment.  CHEST PAIN:  This is atypical and she had a negative POET (Plain Old Exercise Treadmill).  No further testing is indicated.   Current medicines are reviewed at length with the patient today.  The patient does not have concerns regarding medicines.  The following changes have been made:    Labs/ tests ordered today include:     No orders of the defined types were placed in this encounter.    Disposition:   FU with me in 6 months.   Signed, Rollene RotundaJames  Vandana Haman, MD  10/06/2016 4:55 PM    [facility]

## 2016-10-06 ENCOUNTER — Ambulatory Visit (INDEPENDENT_AMBULATORY_CARE_PROVIDER_SITE_OTHER): Payer: BC Managed Care – PPO | Admitting: Cardiology

## 2016-10-06 ENCOUNTER — Encounter: Payer: Self-pay | Admitting: Cardiology

## 2016-10-06 VITALS — BP 112/68 | HR 70 | Ht 61.0 in | Wt 144.0 lb

## 2016-10-06 DIAGNOSIS — R079 Chest pain, unspecified: Secondary | ICD-10-CM | POA: Diagnosis not present

## 2016-10-06 DIAGNOSIS — I2782 Chronic pulmonary embolism: Secondary | ICD-10-CM

## 2016-10-06 NOTE — Patient Instructions (Signed)

## 2016-10-10 ENCOUNTER — Encounter (HOSPITAL_COMMUNITY): Payer: Self-pay | Admitting: Emergency Medicine

## 2016-10-10 ENCOUNTER — Emergency Department (HOSPITAL_COMMUNITY): Payer: BC Managed Care – PPO

## 2016-10-10 ENCOUNTER — Emergency Department (HOSPITAL_COMMUNITY)
Admission: EM | Admit: 2016-10-10 | Discharge: 2016-10-10 | Disposition: A | Discharge status: Home / Self Care | Payer: BC Managed Care – PPO | Attending: Emergency Medicine | Admitting: Emergency Medicine

## 2016-10-10 DIAGNOSIS — R0602 Shortness of breath: Secondary | ICD-10-CM

## 2016-10-10 DIAGNOSIS — Z7901 Long term (current) use of anticoagulants: Secondary | ICD-10-CM | POA: Diagnosis not present

## 2016-10-10 DIAGNOSIS — Z79899 Other long term (current) drug therapy: Secondary | ICD-10-CM | POA: Diagnosis not present

## 2016-10-10 DIAGNOSIS — Z87891 Personal history of nicotine dependence: Secondary | ICD-10-CM | POA: Diagnosis not present

## 2016-10-10 NOTE — ED Triage Notes (Signed)
Pt c/o not getting enough air onset 1 hour PTA. Pt talking in complete sentences without difficulty. Pt reports history of blood clots and is currently taken xerelto.

## 2016-10-10 NOTE — ED Provider Notes (Signed)
I saw and evaluated the patient, reviewed the resident's note and I agree with the findings and plan.   EKG Interpretation  Date/Time:  Sunday Oct 10 2016 19:07:10 EDT Ventricular Rate:  70 PR Interval:  148 QRS Duration: 88 QT Interval:  396 QTC Calculation: 427 R Axis:   82 Text Interpretation:  Normal sinus rhythm Normal ECG Confirmed by Kennen Stammer  MD, Paquita Printy ([id number]) on 10/10/2016 8:56:24 PM     35  year old female here with son onset of dyspnea that lasted for approximately 1 hour. Symptoms resolved spontaneously. She does have a history of PE in the past and takes Xarelto. Here she is in sinus rhythm. She is short of breath. Pulse oximetry is stable. Doubt that she has recurrent PE. Return precautions given   Lorre NickAllen, Dylan Monforte, MD 10/10/16 2147

## 2016-10-10 NOTE — Discharge Instructions (Signed)
I do not think that your shortness of breath is due to another blood clot or anything dangerous in your heart or lungs and your chest Xray is very normal.  Please continue to take your Xarelto as prescribed.  If you have chest pain, worse shortness of breath that does not go away, pass out, or feel your heart racing without exerting yourself, please come back to the ED.

## 2016-10-10 NOTE — ED Provider Notes (Signed)
[facility]-EMERGENCY DEPT Provider Note   CSN: [id number] Arrival date & time: 10/10/16  1827     History   Chief Complaint Chief Complaint  Patient presents with  . Shortness of Breath    HPI Erica Ho is a 36 y.o. female with history of PE (12/17 on Xarelto) who presents with sudden onset dyspnea.  Early this evening while waiting for Mother's Day dinner she had sudden onset of shortness of breath and came to the ED.  While in the waiting room her symptoms gradually improved, and now she has only mild dyspnea.  No chest pain, cough, wheezing, palpitations, or other symptoms.  In 04/2016 she had back pain, dyspnea, tachycardia, and hypoxia and was found a LLL subsegmental PE and started on anticoagulation.  Since that time she has had several ED presentations for dyspnea and chest pain.  She had a [place] at [facility] in January and was told that it showed that the clot was gone.  She is compliant with her Xarelto.  She has been prescribed Xanax for anxiety but has avoided taking them because she doesn't want to get hooked.  HPI  Past Medical History:  Diagnosis Date  . History of migraine headaches   . PE (pulmonary thromboembolism) (HCC)   . Varicose veins     Patient Active Problem List   Diagnosis Date Noted  . Acute pulmonary embolism (HCC) 05/03/2016    Past Surgical History:  Procedure Laterality Date  . CESAREAN SECTION    . CHOLECYSTECTOMY    . TONSILLECTOMY    . TUBAL LIGATION      OB History    No data available       Home Medications    Prior to Admission medications   Medication Sig Start Date End Date Taking? Authorizing Provider  ferrous sulfate 325 (65 FE) MG EC tablet Take 325 mg by mouth daily. 05/21/16   [provider]  rivaroxaban (XARELTO) 20 MG TABS tablet Take 20 mg by mouth daily with supper.    [provider]    Family History Family History  Problem Relation Age of Onset  . Diabetes Mother   . Clotting  disorder Neg Hx     Social History Social History  Substance Use Topics  . Smoking status: Former Smoker    Quit date: 08/26/2011  . Smokeless tobacco: Never Used  . Alcohol use No     Allergies   Imitrex [sumatriptan]; Isovue [iopamidol]; Levofloxacin; Levaquin [levofloxacin in d5w]; and Tape   Review of Systems Review of Systems  Constitutional: Negative for chills and fatigue.  Respiratory: Positive for shortness of breath. Negative for cough, chest tightness and wheezing.   Cardiovascular: Positive for leg swelling. Negative for chest pain and palpitations.  Gastrointestinal: Negative for abdominal pain, nausea and vomiting.  Genitourinary: Negative for dysuria and hematuria.  Neurological: Negative for dizziness, syncope and headaches.  Psychiatric/Behavioral: The patient is nervous/anxious.      Physical Exam Updated Vital Signs BP 133/85   Pulse 76   Temp 97.9 F (36.6 C)   Resp 18   Ht 5\' 1"  (1.549 m)   Wt 65.3 kg   LMP 09/10/2016   SpO2 100%   BMI 27.21 kg/m   Physical Exam  Constitutional: She is oriented to person, place, and time. She appears well-developed and well-nourished. No distress.  Eyes: Conjunctivae are normal. No scleral icterus.  Cardiovascular: Normal rate and regular rhythm.   Pulmonary/Chest: Effort normal and breath sounds normal.  Abdominal: Soft. She exhibits no distension. There is no tenderness.  Musculoskeletal: She exhibits no edema or tenderness.  No posterior calf tenderness No LE edema  Neurological: She is alert and oriented to person, place, and time.  Skin: Skin is warm and dry.  L varicose veins medial L calf  Psychiatric: Her behavior is normal. Judgment normal.  Mildly anxious     ED Treatments / Results  Labs (all labs ordered are listed, but only abnormal results are displayed) Labs Reviewed - No data to display  EKG  EKG Interpretation  Date/Time:  Sunday Oct 10 2016 19:07:10 EDT Ventricular Rate:   70 PR Interval:  148 QRS Duration: 88 QT Interval:  396 QTC Calculation: 427 R Axis:   82 Text Interpretation:  Normal sinus rhythm Normal ECG Confirmed by Freida Busman  MD, ANTHONY ([id number]) on 10/10/2016 8:56:24 PM       Radiology Dg Chest 2 View  Result Date: 10/10/2016 CLINICAL DATA:  Onset of dyspnea EXAM: CHEST  2 VIEW COMPARISON:  08/30/2016 FINDINGS: The heart size and mediastinal contours are within normal limits. Both lungs are clear. The visualized skeletal structures are unremarkable. IMPRESSION: No active cardiopulmonary disease. Electronically Signed   By: Tollie Eth M.D.   On: 10/10/2016 19:58    Procedures Procedures (including critical care time)  Medications Ordered in ED Medications - No data to display   Initial Impression / Assessment and Plan / ED Course  I have reviewed the triage vital signs and the nursing notes.  Pertinent labs & imaging results that were available during my care of the patient were reviewed by me and considered in my medical decision making (see chart for details).  36 year old woman with recent history of PE on DOAC who presents with transient dyspnea and normal vitals in the context of anxiety.  Her normotension, normal HR, full O2 sats, and resolving symptoms make clinically significant PE very unlikely (Wells low risk, only past history), and she is already on appropriate anticoagulation.  She has a normal CXR, is afebrile, and no cough, ruling out PNA.  No wheezing or respiratory distress, no RAD.  Doubt ACS without chest pain, normal EKG, no risk factors, and normal vitals.  -after discussion with patient, will defer repeat chest CTA and D-dimer -return precautions given     Final Clinical Impressions(s) / ED Diagnoses   Final diagnoses:  Shortness of breath    New Prescriptions New Prescriptions   No medications on file     Alm Bustard, MD 10/10/16 2148    Lorre Nick, MD 10/10/16 2306

## 2016-10-28 ENCOUNTER — Telehealth: Payer: Self-pay | Admitting: Cardiology

## 2016-10-28 NOTE — Telephone Encounter (Signed)
New message      Patient c/o Palpitations:  High priority if patient c/o lightheadedness and shortness of breath.  1. How long have you been having palpitations? Started yesterday  2. Are you currently experiencing lightheadedness and shortness of breath? Slight dizziness  3. Have you checked your BP and heart rate? (document readings)  Yesterday----15?/90 and HR 90-95 resting.  Yesterday HR went up 120-140 for about 1 hr  4. Are you experiencing any other symptoms?  Little nausea this am

## 2016-10-28 NOTE — Telephone Encounter (Signed)
Pt of Dr. Antoine PocheHochrein  Pt reports palpitations, HR elevation for approx 1 hr yesterday and again early AM, HR was betw 110-130. BP was also elevated during this time - 140s/88-90 when checked, where it usually runs 100-110/60s-70s.  She denies SOB, CP.  Voices no changes to med regimen, diet, exercise, etc. Voices compliance w xarelto for PE/thrombosis prophylaxis.  She is currently at PCP office for evaluation. Will want f/u w us. I've scheduled her for visit 11/01/16 w Randall AnBrittany Strader, to which pt was agreeable. She'll plan to come in then and report any return of symptoms in interim.

## 2016-10-31 NOTE — Progress Notes (Signed)
Cardiology Office Note    Date:  11/01/2016   ID:  Rinaldo Cloud C. Ady, Heimann 11/20/1980, MRN [id number]  PCP:  Nonnie Done., MD  Cardiologist: Dr. Antoine Poche   Chief Complaint  Patient presents with  . Follow-up    palpitations, fast heart rate and elevated bp with palpitations, lost 4 pounds in last week     History of Present Illness:    Missey Hasley. Kinter is a 36 y.o. female with past medical history of PAC's, PE (on Xarelto), and chest pain (low-risk ETT in 08/2016) who presents to the office today for evaluation of palpitations.   She was last examined by Dr. Antoine Poche on 10/06/2016 for dizziness.  Reported being recently evaluated by a Hematologist for further workup of her clotting disorder in the setting of her recent PE. She was experiencing very rare palpitations which were infrequent and not problematic, therefore she preferred no treatment at that time.  In talking with the patient today, she reports having initial onset of palpitations occurring last Wednesday. She was in the shower when this occurred and she reports she felt 3 "thumps" in her chest. After this, she felt her heart racing for up to 3 hours. Notes mild dizziness but denies any chest pain, dyspnea, or presyncope. Similar symptoms occurred the following day while at rest which again spontaneously resolved within an hour. She denies any caffeine or alcohol intake. No recent medication changes or dietary changes.  She does note an unintentional weight loss of 5 pounds in the past week. She reports urinating very frequently and having excessive thirst. Notes a family history of Type 2 DM. She checked her fasting glucose earlier this week and it was 170. She denies any known diagnosis of diabetes and is unsure if a Hgb A1c has ever been checked.  Past Medical History:  Diagnosis Date  . History of migraine headaches   . PE (pulmonary thromboembolism) (HCC)   . Varicose veins     Past Surgical History:  Procedure  Laterality Date  . CESAREAN SECTION    . CHOLECYSTECTOMY    . TONSILLECTOMY    . TUBAL LIGATION      Current Medications: Outpatient Medications Prior to Visit  Medication Sig Dispense Refill  . ferrous sulfate 325 (65 FE) MG EC tablet Take 325 mg by mouth daily.    . rivaroxaban (XARELTO) 20 MG TABS tablet Take 20 mg by mouth daily with supper.     No facility-administered medications prior to visit.      Allergies:   Imitrex [sumatriptan]; Isovue [iopamidol]; Levofloxacin; Levaquin [levofloxacin in d5w]; and Tape   Social History   Social History  . Marital status: Married    Spouse name: N/A  . Number of children: N/A  . Years of education: N/A   Occupational History  . Cashier    Social History Main Topics  . Smoking status: Former Smoker    Quit date: 08/26/2011  . Smokeless tobacco: Never Used  . Alcohol use No  . Drug use: No  . Sexual activity: Not Asked   Other Topics Concern  . None   Social History Narrative  . None     Family History:  The patient's family history includes Diabetes in her mother.   Review of Systems:   Please see the history of present illness.     General:  No chills, fever or night sweats. Positive for weight loss.  Cardiovascular:  No chest pain, dyspnea on exertion, edema, orthopnea,  paroxysmal nocturnal dyspnea. Positive for palpitations.  Dermatological: No rash, lesions/masses Respiratory: No cough, dyspnea Urologic: No hematuria, dysuria Abdominal:   No nausea, vomiting, diarrhea, bright red blood per rectum, melena, or hematemesis Neurologic:  No visual changes, wkns, changes in mental status. All other systems reviewed and are otherwise negative except as noted above.   Physical Exam:    VS:  BP 104/72   Pulse 78   Ht 5\' 1"  (1.549 m)   Wt 139 lb (63 kg)   BMI 26.26 kg/m    General: Well developed, well nourished Caucasian female appearing in no acute distress. Head: Normocephalic, atraumatic, sclera non-icteric,  no xanthomas, nares are without discharge.  Neck: No carotid bruits. JVD not elevated.  Lungs: Respirations regular and unlabored, without wheezes or rales.  Heart: Regular rate and rhythm. No S3 or S4.  No murmur, no rubs, or gallops appreciated. Abdomen: Soft, non-tender, non-distended with normoactive bowel sounds. No hepatomegaly. No rebound/guarding. No obvious abdominal masses. Msk:  Strength and tone appear normal for age. No joint deformities or effusions. Extremities: No clubbing or cyanosis. No lower extremity edema.  Distal pedal pulses are 2+ bilaterally. Neuro: Alert and oriented X 3. Moves all extremities spontaneously. No focal deficits noted. Psych:  Responds to questions appropriately with a normal affect. Skin: No rashes or lesions noted  Wt Readings from Last 3 Encounters:  11/01/16 139 lb (63 kg)  10/10/16 144 lb (65.3 kg)  10/06/16 144 lb (65.3 kg)     Studies/Labs Reviewed:   EKG:  EKG is ordered today.  The ekg ordered today demonstrates NSR, HR 78, with no acute ST or T-wave changes.   Recent Labs: 05/04/2016: Magnesium 1.9 05/05/2016: Platelets 203 05/13/2016: BUN 8; Creatinine, Ser 0.60; Hemoglobin 11.6; Potassium 3.5; Sodium 140 05/21/2016: TSH 1.68   Lipid Panel No results found for: CHOL, TRIG, HDL, CHOLHDL, VLDL, LDLCALC, LDLDIRECT  Additional studies/ records that were reviewed today include:   Cardiac Event Monitor: 05/2016 Rhythm Findings:  1.Ventricular: infrequent PVCs, one run of V.Tach - only 4 beats.  2.Supraventricular: No None  3.Bradyarrhythmias and Pauses: No  Symptoms reported:palpitations  CONCLUSIONS: 1.Abnormal event monitor.  2.Arrhythmia as described 3.Symptoms werereported  4.Symptoms werecorrelated with arrhythmias  Multiple symptoms reported, majority of times while palpitations there were no arrhythmias. Sometimes PVC's and one episodes of ventricular tachycardia was felt as palpitations.  ETT:  08/2016:   Blood pressure demonstrated a normal response to exercise.  There was no ST segment deviation noted during stress.  Negative exercise stress test for ischemia at an adequate heart rate of 94% MPHR  Good exercise tolerance at 11 mets.  This is a low risk study.  Assessment:    1. Palpitations   2. PAC (premature atrial contraction)   3. Excessive thirst   4. Weight loss      Plan:   In order of problems listed above:  1. Palpitations/ History of PAC's - she has a known history of palpitations with a recent monitor in 05/2016 showing PAC's and PVC's with 4 beats of NSVT. ETT was low-risk and echo showed a preserved EF with no WMA.  - she has noted increased palpitations over the past week which can last for up to an hour at a time. No recent diet or medication changes. No sick contacts. She does not consume alcohol or caffeine.  - TSH checked in 05/2016 and normal at that time. Will check BMET and Mg.  - will start PRN Lopressor 12.5mg  to  take as needed for palpitations. Reviewed vagal maneuvers with the patient as well.    2. Excessive Thirst/ Weight Loss - she has noted excessive thirst for the past several weeks. Has experienced a weight loss of 5 lbs in the past week. - she does have a significant family history of Type 2 DM and reports her fasting glucose was elevated to 170 earlier this week. - will check Hgb A1c, BMET, and Mg. TSH checked recently and normal at that time.   3. History of Unprovoked Pulmonary Embolus - she remains on Xarelto.  - denies any evidence of active bleeding. Has experienced menorrhagia and has follow-up with her OB-GYN scheduled for later this week to discuss other options.     Medication Adjustments/Labs and Tests Ordered: Current medicines are reviewed at length with the patient today.  Concerns regarding medicines are outlined above.  Medication changes, Labs and Tests ordered today are listed in the Patient Instructions  below. Patient Instructions  Medication Instructions:  LOPRESSOR 12.5MG  (1/2 TAB) MAY TAKE UP TO TWICE DAILY  If you need a refill on your cardiac medications before your next appointment, please call your pharmacy.  Labwork: BMP, MAG, A1C TODAY HERE IN OUR OFFICE AT [facility]  Follow-Up: Your physician wants you to follow-up in: October AS PL[facility]. You will receive a reminder letter in the mail two months in advance. If you don't receive a letter, please call our office AUGUST 2018 to schedule the October 2018 follow-up appointment.   Thank you for choosing [facility]!!     Signed, Ellsworth LennoxBrittany M Daveon Arpino, [place]  11/01/2016 10:48 AM    [facility] [address], [address] [place], [place]  [address] Phone: [phone]; Fax: [phone]) [phone]  [address], [address] [place], [place] [address] Phone: [phone]

## 2016-11-01 ENCOUNTER — Ambulatory Visit (INDEPENDENT_AMBULATORY_CARE_PROVIDER_SITE_OTHER): Payer: BC Managed Care – PPO | Admitting: Student

## 2016-11-01 ENCOUNTER — Encounter: Payer: Self-pay | Admitting: Student

## 2016-11-01 VITALS — BP 104/72 | HR 78 | Ht 61.0 in | Wt 139.0 lb

## 2016-11-01 DIAGNOSIS — R631 Polydipsia: Secondary | ICD-10-CM | POA: Diagnosis not present

## 2016-11-01 DIAGNOSIS — R634 Abnormal weight loss: Secondary | ICD-10-CM | POA: Diagnosis not present

## 2016-11-01 DIAGNOSIS — Z86711 Personal history of pulmonary embolism: Secondary | ICD-10-CM

## 2016-11-01 DIAGNOSIS — R002 Palpitations: Secondary | ICD-10-CM | POA: Diagnosis not present

## 2016-11-01 DIAGNOSIS — I491 Atrial premature depolarization: Secondary | ICD-10-CM

## 2016-11-01 MED ORDER — METOPROLOL TARTRATE 25 MG PO TABS: 12.5000 mg | ORAL_TABLET | Freq: Two times a day (BID) | ORAL | 3 refills | Status: DC

## 2016-11-01 NOTE — Patient Instructions (Addendum)
Medication Instructions:  LOPRESSOR 12.5MG  (1/2 TAB) MAY TAKE UP TO TWICE DAILY  If you need a refill on your cardiac medications before your next appointment, please call your pharmacy.  Labwork: BMP, MAG, A1C TODAY HERE IN OUR OFFICE AT [facility]  Follow-Up: Your physician wants you to follow-up in: October [facility]. You will receive a reminder letter in the mail two months in advance. If you don't receive a letter, please call our office AUGUST 2018 to schedule the October 2018 follow-up appointment.   Thank you for choosing [facility]!!

## 2016-11-01 NOTE — Telephone Encounter (Signed)
The patient has been seen in the clinic today for follow up of this.

## 2016-11-02 ENCOUNTER — Emergency Department (HOSPITAL_COMMUNITY): Payer: BC Managed Care – PPO

## 2016-11-02 ENCOUNTER — Encounter (HOSPITAL_COMMUNITY): Payer: Self-pay

## 2016-11-02 ENCOUNTER — Telehealth: Payer: Self-pay | Admitting: Student

## 2016-11-02 ENCOUNTER — Emergency Department (HOSPITAL_COMMUNITY)
Admission: EM | Admit: 2016-11-02 | Discharge: 2016-11-03 | Disposition: A | Discharge status: Home / Self Care | Payer: BC Managed Care – PPO | Attending: Emergency Medicine | Admitting: Emergency Medicine

## 2016-11-02 DIAGNOSIS — R002 Palpitations: Secondary | ICD-10-CM | POA: Diagnosis not present

## 2016-11-02 DIAGNOSIS — R0789 Other chest pain: Secondary | ICD-10-CM | POA: Diagnosis present

## 2016-11-02 DIAGNOSIS — Z79899 Other long term (current) drug therapy: Secondary | ICD-10-CM | POA: Diagnosis not present

## 2016-11-02 DIAGNOSIS — J45909 Unspecified asthma, uncomplicated: Secondary | ICD-10-CM | POA: Diagnosis not present

## 2016-11-02 DIAGNOSIS — R079 Chest pain, unspecified: Secondary | ICD-10-CM

## 2016-11-02 DIAGNOSIS — Z87891 Personal history of nicotine dependence: Secondary | ICD-10-CM | POA: Diagnosis not present

## 2016-11-02 LAB — BASIC METABOLIC PANEL
Anion gap: 10 (ref 5–15)
BUN / CREAT RATIO: 18 (ref 9–23)
BUN: 12 mg/dL (ref 6–20)
BUN: 6 mg/dL (ref 6–20)
CHLORIDE: 102 mmol/L (ref 101–111)
CO2: 26 mmol/L (ref 18–29)
CO2: 24 mmol/L (ref 22–32)
Calcium: 9.6 mg/dL (ref 8.7–10.2)
Calcium: 9.1 mg/dL (ref 8.9–10.3)
Chloride: 101 mmol/L (ref 96–106)
Creatinine, Ser: 0.67 mg/dL (ref 0.57–1.00)
Creatinine, Ser: 0.66 mg/dL (ref 0.44–1.00)
GFR calc Af Amer: 130 mL/min/{1.73_m2} (ref >59)
GFR calc non Af Amer: >60 mL/min (ref >60)
GFR, EST NON AFRICAN AMERICAN: 113 mL/min/{1.73_m2} (ref >59)
Glucose, Bld: 106 mg/dL — ABNORMAL HIGH (ref 65–99)
Glucose: 93 mg/dL (ref 65–99)
POTASSIUM: 4.5 mmol/L (ref 3.5–5.2)
POTASSIUM: 4 mmol/L (ref 3.5–5.1)
SODIUM: 139 mmol/L (ref 134–144)
SODIUM: 136 mmol/L (ref 135–145)

## 2016-11-02 LAB — CBC
HEMATOCRIT: 38 % (ref 36.0–46.0)
Hemoglobin: 12.9 g/dL (ref 12.0–15.0)
MCH: 30.1 pg (ref 26.0–34.0)
MCHC: 33.9 g/dL (ref 30.0–36.0)
MCV: 88.6 fL (ref 78.0–100.0)
Platelets: 178 10*3/uL (ref 150–400)
RBC: 4.29 MIL/uL (ref 3.87–5.11)
RDW: 12.6 % (ref 11.5–15.5)
WBC: 7.2 10*3/uL (ref 4.0–10.5)

## 2016-11-02 LAB — HEMOGLOBIN A1C
Est. average glucose Bld gHb Est-mCnc: 94 mg/dL
HEMOGLOBIN A1C: 4.9 % (ref 4.8–5.6)

## 2016-11-02 LAB — I-STAT TROPONIN, ED: Troponin i, poc: 0 ng/mL (ref 0.00–0.08)

## 2016-11-02 LAB — MAGNESIUM: Magnesium: 2.1 mg/dL (ref 1.6–2.3)

## 2016-11-02 NOTE — ED Provider Notes (Signed)
[facility]-EMERGENCY DEPT Provider Note   CSN: [id number] Arrival date & time: 11/02/16  1929  By signing my name below, I, Erica Ho, attest that this documentation has been prepared under the direction and in the presence of Erica Ho, Erica Brimhristopher J, MD. Electronically Signed: Rosario AdieWilliam Andrew Ho, ED Scribe. 11/02/16. 11:32 PM.  History   Chief Complaint Chief Complaint  Patient presents with  . Chest Pain   The history is provided by the patient and medical records. No language interpreter was used.    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 Per pt, she was sitting at home tonight when she had a sharp pain to her left-sided chest which radiated around her left lateral chest and into her back. She states that her pain lasted for approximately 10 minutes prior to spontaneously resolving. She notes associated sensation of palpitations and dizziness accompanying her chest pain which have both resolved while in the ED as well. No noted treatments for her pain were tried prior to coming into the ED. Pt notes that she did wear a cardiac event monitor in 01/18, at that time she was found to have one run of V. Tach and infrequent PVCs; during these episodes she states that she felt otherwise asymptomatic. Pt was also seen by her cardiologist's office yesterday, at that time she was started on PRN Lopressor for recurrent palpitations. She denies shortness of breath, or any other associated symptoms.   Cardiologist: Rollene RotundaJames Hochrein, MD  Past Medical History:  Diagnosis Date  . Asthma   . Heart palpitations    a. 05/2016: event monitor showing PAC's with one episode of 4 beats NSVT  . History of migraine headaches   . PE (pulmonary thromboembolism) (HCC)    a. 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    . Varicose veins    Patient Active Problem List   Diagnosis Date Noted  . Acute pulmonary embolism (HCC) 05/03/2016   Past Surgical History:  Procedure Laterality Date  . CESAREAN SECTION    . CHOLECYSTECTOMY    . TONSILLECTOMY    . TUBAL LIGATION     OB History    No data available     Home Medications    Prior to Admission medications   Medication Sig Start Date End Date Taking? Authorizing Provider  ferrous sulfate 325 (65 FE) MG EC tablet Take 325 mg by mouth daily. 05/21/16  Yes [provider]  rivaroxaban (XARELTO) 20 MG TABS tablet Take 20 mg by mouth daily with supper.   Yes [provider]  metoprolol tartrate (LOPRESSOR) 25 MG tablet Take 0.5 tablets (12.5 mg total) by mouth 2 (two) times daily. MAY TAKE UP TO TWICE DAILY 11/01/16 01/30/17  Erica Ho, Erica M, PA-C   Family History Family History  Problem Relation Age of Onset  . Diabetes Mother   . Clotting disorder Neg Hx    Social History Social History  Substance Use Topics  . Smoking status: Former Smoker    Quit date: 08/26/2011  . Smokeless tobacco: Never Used  . Alcohol use No   Allergies   Imitrex [sumatriptan]; Isovue [iopamidol]; Levofloxacin; Levaquin [levofloxacin in d5w]; and Tape  Review of Systems Review of Systems  Respiratory: Negative for shortness of breath.   Cardiovascular: Positive for chest pain and palpitations.  Neurological: Positive  for dizziness.  All other systems reviewed and are negative.  Physical Exam Updated Vital Signs BP 119/78   Pulse 95   Temp 98 F (36.7 C) (Oral)   Resp 20   Ht 5\' 1"  (1.549 Ho)   Wt 63 kg (139 lb)   SpO2 100%   BMI 26.26 kg/Ho   Physical Exam  Constitutional: She is oriented to person, place, and time. She appears well-developed and well-nourished. No distress.  HENT:  Head: Normocephalic and atraumatic.  Right Ear: Hearing normal.  Left Ear: Hearing normal.  Nose: Nose normal.  Mouth/Throat: Oropharynx is clear and  moist and mucous membranes are normal.  Eyes: Conjunctivae and EOM are normal. Pupils are equal, round, and reactive to light.  Neck: Normal range of motion. Neck supple.  Cardiovascular: Regular rhythm, S1 normal and S2 normal.  Exam reveals no gallop and no friction rub.   No murmur heard. Pulmonary/Chest: Effort normal and breath sounds normal. No respiratory distress. She exhibits no tenderness.  Abdominal: Soft. Normal appearance and bowel sounds are normal. There is no hepatosplenomegaly. There is no tenderness. There is no rebound, no guarding, no tenderness at McBurney's point and negative Murphy's sign. No hernia.  Musculoskeletal: Normal range of motion.  Neurological: She is alert and oriented to person, place, and time. She has normal strength. No cranial nerve deficit or sensory deficit. Coordination normal. GCS eye subscore is 4. GCS verbal subscore is 5. GCS motor subscore is 6.  Skin: Skin is warm, dry and intact. No rash noted. No cyanosis.  Psychiatric: She has a normal mood and affect. Her speech is normal and behavior is normal. Thought content normal.  Nursing note and vitals reviewed.  ED Treatments / Results  DIAGNOSTIC STUDIES: Oxygen Saturation is 100% on RA, normal by my interpretation.   COORDINATION OF CARE: 3:33 AM-Discussed next steps with pt. Pt verbalized understanding and is agreeable with the plan.   Labs (all labs ordered are listed, but only abnormal results are displayed) Labs Reviewed  BASIC METABOLIC PANEL - Abnormal; Notable for the following:       Result Value   Glucose, Bld 106 (*)    All other components within normal limits  D-DIMER, QUANTITATIVE (NOT AT [facility]) - Abnormal; Notable for the following:    D-Dimer, Quant 0.67 (*)    All other components within normal limits  CBC  I-STAT TROPOININ, ED   EKG  EKG Interpretation  Date/Time:  Tuesday November 02 2016 19:46:05 EDT Ventricular Rate:  101 PR Interval:    QRS Duration: 87 QT  Interval:  339 QTC Calculation: 440 R Axis:   86 Text Interpretation:  Sinus tachycardia with irregular rate Confirmed by Juleen China  MD, STEPHEN [phone]) on 11/02/2016 9:25:14 PM      Radiology Dg Chest 2 View  Result Date: 11/02/2016 CLINICAL DATA:  Heart palpitations EXAM: CHEST  2 VIEW COMPARISON:  10/17/2016, FINDINGS: The heart size and mediastinal contours are within normal limits. Both lungs are clear. The visualized skeletal structures are unremarkable. IMPRESSION: No active cardiopulmonary disease. Electronically Signed   By: Jasmine Pang Ho.D.   On: 11/02/2016 20:05   Ct Angio Chest Pe W Or Wo Contrast  Result Date: 11/03/2016 CLINICAL DATA:  Chest pain for 10 minutes with elevated D-dimer. EXAM: CT ANGIOGRAPHY CHEST WITH CONTRAST TECHNIQUE: Multidetector CT imaging of the chest was performed using the standard protocol during bolus administration of intravenous contrast. Multiplanar CT image reconstructions and MIPs were obtained to evaluate the  vascular anatomy. CONTRAST:  80 cc Isovue 370 IV COMPARISON:  Exams dating back through 05/02/2016. FINDINGS: Cardiovascular: Satisfactory opacification of the pulmonary arteries to the segmental level. No evidence of pulmonary embolism. Normal heart size. No pericardial effusion. No aortic aneurysm or dissection. Normal branch pattern of the great vessels Mediastinum/Nodes: No enlarged mediastinal, hilar, or axillary lymph nodes. Thyroid gland, trachea, and esophagus demonstrate no significant findings. Lungs/Pleura: Stable subpleural 3 mm nodule in the right lower lobe almost certainly to be benign. No dominant mass, effusion or pneumonic consolidation. No pneumothorax. Upper Abdomen: Status post cholecystectomy. No acute upper abdominal abnormality. Musculoskeletal: No chest wall abnormality. No acute or significant osseous findings. Review of the MIP images confirms the above findings. IMPRESSION: 1. No acute pulmonary embolus. 2. Stable 3 mm right lower  lobe pulmonary nodule dating back at least 19 months. No additional followup recommendations. Electronically Signed   By: Tollie Eth Ho.D.   On: 11/03/2016 01:26   Procedures Procedures   Medications Ordered in ED Medications  iopamidol (ISOVUE-370) 76 % injection (100 mLs  Contrast Given 11/03/16 0051)    Initial Impression / Assessment and Plan / ED Course  I have reviewed the triage vital signs and the nursing notes.  Pertinent labs & imaging results that were available during my care of the patient were reviewed by me and considered in my medical decision making (see chart for details).     Patient presents with complaints of heart palpitations and chest pain. She has had a long history of similar symptoms in the past. She does have a history of PE last year, has been on Xarelto since. She has seen cardiology, had an outpatient monitor that showed PACs, PVCs and questionable for the run of V. Tach.  Patient's workup has been negative here. She did have an elevated d-dimer and therefore CT angiography was performed to rule out recurrent PE. No PE is seen. Discussed with patient that she will need to follow-up with cardiology. She was given a prescription for Lopressor and we used as needed, will encourage her to take it twice daily until follow-up. Return for any sustained symptoms.  Final Clinical Impressions(s) / ED Diagnoses   Final diagnoses:  Chest pain  Heart palpitations   New Prescriptions New Prescriptions   No medications on file   I personally performed the services described in this documentation, which was scribed in my presence. The recorded information has been reviewed and is accurate.     Gilda Crease, MD 11/03/16 [phone]

## 2016-11-02 NOTE — Telephone Encounter (Signed)
Returned pts call and have made her aware of her lab results. She verbalized understanding.

## 2016-11-02 NOTE — ED Notes (Signed)
Pt now c/o of dizziness, states she feels like she is going to pass out, pt is flushed in the face. No change in heart rate or rhythm

## 2016-11-02 NOTE — ED Notes (Signed)
Patient transported to X-ray 

## 2016-11-02 NOTE — Telephone Encounter (Signed)
New message ° ° °Pt is calling back returning call to Jennifer. °

## 2016-11-02 NOTE — Telephone Encounter (Signed)
-----   Message from Ellsworth LennoxBrittany M Strader, [place]-C sent at 11/02/2016 12:48 PM EDT ----- Please let the patient know her electrolytes are within normal limits. No identifying factor for her palpitations. Hgb A1c was also normal which rules out Type 2 DM. Thank you.

## 2016-11-02 NOTE — ED Notes (Signed)
Pt states that she normally takes her xarelto at 8pm, pt states she has her medication with her and would like to take it, ok per Dr. Juleen ChinaKohut. Pt cardiologist came to see pt.

## 2016-11-02 NOTE — ED Triage Notes (Signed)
Pt started having heart palpations around one hour ago with pain in the L arm that radiated to the chest. Pain is constant, worse with inspiration. Hx of PE in L lung, on Xareleto.

## 2016-11-03 ENCOUNTER — Telehealth: Payer: Self-pay | Admitting: Cardiology

## 2016-11-03 ENCOUNTER — Emergency Department (HOSPITAL_COMMUNITY): Payer: BC Managed Care – PPO

## 2016-11-03 ENCOUNTER — Encounter (HOSPITAL_COMMUNITY): Payer: Self-pay

## 2016-11-03 LAB — D-DIMER, QUANTITATIVE: D-Dimer, Quant: 0.67 ug/mL-FEU — ABNORMAL HIGH (ref 0.00–0.50)

## 2016-11-03 MED ORDER — IOPAMIDOL (ISOVUE-370) INJECTION 76%
INTRAVENOUS | Status: AC
  Administered 2016-11-03: 100 mL
  Filled 2016-11-03: qty 100

## 2016-11-03 NOTE — Telephone Encounter (Signed)
S/w pt she states that she had such an irregular HR and did not feel good she states that "she thought that she was going to die" she states that she does not remember her BP but her HR was 188 and was having palpitations that seemed like "forever" so she  Went to the ED. She states that so far today she has not had palpitations or any other sx.She states that she spoke with a cardiologist while there and states that he told her that she needs a referral to EP physician, and to have an ECHO and may need ablation, a Holter monitor and  maybe be ICP implanted or maybe even an ablation. I do not see this note. 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  Please advise

## 2016-11-03 NOTE — ED Notes (Signed)
Patient transported to CT 

## 2016-11-03 NOTE — Telephone Encounter (Signed)
Erica Ho is calling because she was in the ER on last night for some issues with her heart . Wanting to speak with a nurse .Marland Kitchen. Please call .Marland Kitchen.  Thanks

## 2016-11-03 NOTE — Telephone Encounter (Signed)
S/w BMS she states to find out if pt took metoprolol yesterday before she went to  ED yesterday and let pt know that she may take metoprolol 1/2-1 tablet BID PRN. Pt was referred yesterday(jennifer witty)to EP MD to decide the next steps for pt.  Ok to RTW tomorrow. Letter at the front desk for pt p/u  Spoke with pt she states that yesterday she did not take her metoprolol she states that she called ambulance because she felt so bad. She states that she has taken metoprolol 1/2 tablet today and now is feeling "jittery" but no sx.

## 2016-11-05 ENCOUNTER — Encounter: Payer: Self-pay | Admitting: *Deleted

## 2016-11-05 ENCOUNTER — Ambulatory Visit (INDEPENDENT_AMBULATORY_CARE_PROVIDER_SITE_OTHER): Payer: BC Managed Care – PPO | Admitting: Internal Medicine

## 2016-11-05 VITALS — BP 100/82 | HR 78 | Ht 61.0 in | Wt 138.0 lb

## 2016-11-05 DIAGNOSIS — R002 Palpitations: Secondary | ICD-10-CM | POA: Diagnosis not present

## 2016-11-05 DIAGNOSIS — I491 Atrial premature depolarization: Secondary | ICD-10-CM

## 2016-11-05 DIAGNOSIS — I493 Ventricular premature depolarization: Secondary | ICD-10-CM | POA: Diagnosis not present

## 2016-11-05 DIAGNOSIS — Z86711 Personal history of pulmonary embolism: Secondary | ICD-10-CM | POA: Diagnosis not present

## 2016-11-05 NOTE — Progress Notes (Signed)
ELECTROPHYSIOLOGY CONSULT NOTE  Patient ID: Erica Ho, MRN: [id number], DOB/AGE: December 06, 1980 36 y.o. Admit date: (Not on file) Date of Consult: 11/05/2016  Primary Physician: Nonnie Done., MD Primary Cardiologist: [facility]   Erica Ho is being seen today for the evaluation of tchycardia  at the request of  Rollene Rotunda, MD.   HPI Erica Ho is a 35 y.o. female  Referred from the ER following a presentation with tachypalpitations and the recording rate of 180 but no strips to confirm  It was assoc with left sided chest pain with radiation to left side and back HR was normal on arrival to [facility]   There was also some dizziness  Tn was neg and D dimer + >>CT angio no recurrent PE  She has been followed closely by cardiology--- Dr. Antoine Poche for hx of PE and palpitations  1/18 A 30 day event recorder (by report-- but no strips) showed PACs and VT NS  4 beats   November 17 she had visual loss. A CT scan was unremarkable. 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  Echo ( without bubble study) was normal without comments on R sided chamber enlargement or R.L shunt  There's been a question raised as to whether she's had atrial fibrillation.  Treadmill testing was negative.  Past Medical History:  Diagnosis Date  . Asthma   . Heart palpitations    a. 05/2016: event monitor showing PAC's with one episode of 4 beats NSVT  . History of migraine headaches   . PE (pulmonary thromboembolism) (HCC)    a. 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 Varicose veins       Surgical History:  Past Surgical History:  Procedure Laterality Date  . CESAREAN SECTION    . CHOLECYSTECTOMY    . TONSILLECTOMY    . TUBAL LIGATION       Home Meds: Prior to Admission medications   Medication Sig Start Date End Date Taking? Authorizing Provider  ferrous sulfate 325 (65 FE) MG EC tablet Take 325 mg by  mouth daily. 05/21/16   [provider]  metoprolol tartrate (LOPRESSOR) 25 MG tablet Take 0.5 tablets (12.5 mg total) by mouth 2 (two) times daily. MAY TAKE UP TO TWICE DAILY 11/01/16 01/30/17  [place] Ouch, Lennart Pall, PA-C  rivaroxaban (XARELTO) 20 MG TABS tablet Take 20 mg by mouth daily with supper.    [provider]    Allergies:  Allergies  Allergen Reactions  . Imitrex [Sumatriptan] Palpitations  . Isovue [Iopamidol] Other (See Comments)    Couldn't focus eyes well afterwards; things looked like they had shadows  . Levofloxacin Other (See Comments)    Skin felt like it was on "fire"  . Levaquin [Levofloxacin In D5w] Palpitations  . Tape Rash    Social History   Social History  . Marital status: Married    Spouse name: N/A  . Number of children: N/A  . Years of education: N/A   Occupational History  . Cashier    Social History Main Topics  . Smoking status: Former Smoker    Quit date: 08/26/2011  . Smokeless tobacco: Never Used  . Alcohol use No  . Drug use: No  . Sexual activity: Not on file   Other Topics Concern  . Not on file   Social History Narrative  . No narrative on file     Family  History  Problem Relation Age of Onset  . Diabetes Mother   . Clotting disorder Neg Hx     ROS:  Please see the history of present illness.     All other systems reviewed and negative.    Physical Exam: Blood pressure 100/82, pulse 78, height 5\' 1"  (1.549 m), weight 138 lb (62.6 kg), SpO2 100 %. General: Well developed, well nourished female in no acute distress. Head: Normocephalic, atraumatic, sclera non-icteric, no xanthomas, nares are without discharge. EENT: normal  Lymph Nodes:  none Neck: Negative for carotid bruits. JVD not elevated. Back:without scoliosis kyphosis Lungs: Clear bilaterally to auscultation without wheezes, rales, or rhonchi. Breathing is unlabored. Heart: RRR with S1 S2. No  murmur . No rubs, or gallops appreciated. Abdomen: Soft,  non-tender, non-distended with normoactive bowel sounds. No hepatomegaly. No rebound/guarding. No obvious abdominal masses. Msk:  Strength and tone appear normal for age. Extremities: No clubbing or cyanosis. No edema.  Distal pedal pulses are 2+ and equal bilaterally. Skin: Warm and Dry Neuro: Alert and oriented X 3. CN III-XII intact Grossly normal sensory and motor function . Psych:  Responds to questions appropriately with a normal affect.      Labs: Cardiac Enzymes No results for input(s): CKTOTAL, CKMB, TROPONINI in the last 72 hours. CBC Lab Results  Component Value Date   WBC 7.2 11/02/2016   HGB 12.9 11/02/2016   HCT 38.0 11/02/2016   MCV 88.6 11/02/2016   PLT 178 11/02/2016   PROTIME: No results for input(s): LABPROT, INR in the last 72 hours. Chemistry  Recent Labs Lab 11/02/16 2002  NA 136  K 4.0  CL 102  CO2 24  BUN 6  CREATININE 0.66  CALCIUM 9.1  GLUCOSE 106*   Lipids No results found for: CHOL, HDL, LDLCALC, TRIG BNP No results found for: PROBNP Thyroid Function Tests: No results for input(s): TSH, T4TOTAL, T3FREE, THYROIDAB in the last 72 hours.  Invalid input(s): FREET3 Miscellaneous Lab Results  Component Value Date   DDIMER 0.67 (H) 11/02/2016    Radiology/Studies:  Dg Chest 2 View  Result Date: 11/02/2016 CLINICAL DATA:  Heart palpitations EXAM: CHEST  2 VIEW COMPARISON:  10/17/2016, FINDINGS: The heart size and mediastinal contours are within normal limits. Both lungs are clear. The visualized skeletal structures are unremarkable. IMPRESSION: No active cardiopulmonary disease. Electronically Signed   By: Jasmine Pang M.D.   On: 11/02/2016 20:05   Dg Chest 2 View  Result Date: 10/10/2016 CLINICAL DATA:  Onset of dyspnea EXAM: CHEST  2 VIEW COMPARISON:  08/30/2016 FINDINGS: The heart size and mediastinal contours are within normal limits. Both lungs are clear. The visualized skeletal structures are unremarkable. IMPRESSION: No active  cardiopulmonary disease. Electronically Signed   By: Tollie Eth M.D.   On: 10/10/2016 19:58   Ct Angio Chest Pe W Or Wo Contrast  Result Date: 11/03/2016 CLINICAL DATA:  Chest pain for 10 minutes with elevated D-dimer. EXAM: CT ANGIOGRAPHY CHEST WITH CONTRAST TECHNIQUE: Multidetector CT imaging of the chest was performed using the standard protocol during bolus administration of intravenous contrast. Multiplanar CT image reconstructions and MIPs were obtained to evaluate the vascular anatomy. CONTRAST:  80 cc Isovue 370 IV COMPARISON:  Exams dating back through 05/02/2016. FINDINGS: Cardiovascular: Satisfactory opacification of the pulmonary arteries to the segmental level. No evidence of pulmonary embolism. Normal heart size. No pericardial effusion. No aortic aneurysm or dissection. Normal branch pattern of the great vessels Mediastinum/Nodes: No enlarged mediastinal, hilar, or axillary lymph  nodes. Thyroid gland, trachea, and esophagus demonstrate no significant findings. Lungs/Pleura: Stable subpleural 3 mm nodule in the right lower lobe almost certainly to be benign. No dominant mass, effusion or pneumonic consolidation. No pneumothorax. Upper Abdomen: Status post cholecystectomy. No acute upper abdominal abnormality. Musculoskeletal: No chest wall abnormality. No acute or significant osseous findings. Review of the MIP images confirms the above findings. IMPRESSION: 1. No acute pulmonary embolus. 2. Stable 3 mm right lower lobe pulmonary nodule dating back at least 19 months. No additional followup recommendations. Electronically Signed   By: Tollie Ethavid  Kwon M.D.   On: 11/03/2016 01:26    EKG:  Sinus rhythm at 63 Intervals 15/09/39  Otherwise normal   Assessment and Plan:  Palpitations with documented PAC//  Glucose is in the first is not yours as he is you have PVCs  Ventricular tachycardia-9 sustained by report  Pulmonary embolism  Transient visual loss? Ocular migraine  The episode of  rapid tachycardia palpitations is without recording. Given the report of nonsustained VT, it raises the question as to whether there may be an underlying structural issue not yet elucidated i.e. her echo was normal. Gating confirmation of the nonsustained VT would be important as it suggest making sure that her heart muscles in fact normal. As relates to his infrequent tachypalpitations, AliveCor monitoring seems to be most reasonable to all. I have outlined this with her.  I also spoke with Dr. Antoine PocheHochrein regarding the issue of the transient visual loss. This may well be an ocular migraine. However, in the setting of a prior pulmonary embolism the issue of the possibility of right to left shunting is begged.  We will undertake an echo study with bubble I will see her at Dr. Darlina RumpfJH's request       Sherryl MangesSteven Masai Kidd

## 2016-11-05 NOTE — Patient Instructions (Addendum)
Medication Instructions: - Your physician recommends that you continue on your current medications as directed. Please refer to the Current Medication list given to you today.  Labwork: - none ordered  Procedures/Testing: - Your physician has requested that you have an echocardiogram with a bubble study. Echocardiography is a painless test that uses sound waves to create images of your heart. It provides your doctor with information about the size and shape of your heart and how well your heart's chambers and valves are working. This procedure takes approximately one hour. There are no restrictions for this procedure.  Follow-Up: - Your physician recommends that you schedule a follow-up appointment in: 2-3 months with Dr. Antoine PocheHochrein.   Any Additional Special Instructions Will Be Listed Below (If Applicable). - Alive Cor app for your phone    If you need a refill on your cardiac medications before your next appointment, please call your pharmacy.

## 2016-11-07 ENCOUNTER — Encounter: Payer: Self-pay | Admitting: Student

## 2016-11-08 ENCOUNTER — Telehealth: Payer: Self-pay | Admitting: Student

## 2016-11-08 NOTE — Telephone Encounter (Signed)
New message     Pt c/o of Chest Pain: STAT if CP now or developed within 24 hours  1. Are you having CP right now? yes  2. Are you experiencing any other symptoms (ex. SOB, nausea, vomiting, sweating)? No, pain in shoulder blade and a flip flop feeling in chest   3. How long have you been experiencing CP? 45 minutes, for this episode , heart rates goes up and bp goes up   4. Is your CP continuous or coming and going? Comes and goes   5. Have you taken Nitroglycerin? no ?

## 2016-11-08 NOTE — Telephone Encounter (Signed)
Spoke with pt, aware of Erica Ho's recommendations. She will continue to monitor and call with concerns. She has gotten her alivecor and is going to work on getting it up and working to send strips in.

## 2016-11-08 NOTE — Telephone Encounter (Signed)
She has been sending messages in Epic throughout the day and I have replied to these. There is not a weaker BB than a 1/4 of a tablet of the lowest dose of Lopressor. Other classes of medications such as calcium channel blockers can still have a significant effects on her HR and BP. She was recently evaluated by Dr. Graciela HusbandsKlein. Might be worth reaching out to him to see if he has any other suggestions.   Thanks,  [place]

## 2016-11-08 NOTE — Telephone Encounter (Signed)
Spoke with pt, she is currently on the metoprolol 1/4 and this afternoon she started having back pain and her heart rate and pulse elevated and she took another 1/4 of tablet. She reports her bp and pulse has come down since taking the 1/4 tablet. She is wanting to know if there is a weaker beta blocker she can try. It has helped with her palpitations but she is not sure what this pain is. Will forward to brittney strader pa.

## 2016-11-11 ENCOUNTER — Encounter: Payer: Self-pay | Admitting: Internal Medicine

## 2016-11-12 ENCOUNTER — Telehealth: Payer: Self-pay | Admitting: Cardiology

## 2016-11-12 NOTE — Telephone Encounter (Signed)
New message      Pt is had another "episode" this morning.  While sitting her heart started "flipping" and her heart rate went between 120-150.  Now it is back down in the 80's.  Pt has her beta blocker but want to talk to the nurse.  Please call

## 2016-11-12 NOTE — Telephone Encounter (Signed)
Pt of Dr. Antoine PocheHochrein Hx DVT (on Xarelto) Palpitations (on PRN metoprolol)  Spoke to patient. Identifies concern for her HR going high.  She notes she had a tachycardic episode yesterday afternoon and again this morning. She submitted Alivecor readings yesterday by email (see Notes tab - strips available in yesterday's e-mail by clicking on report under submitted Media). Both episodes self resolved but she took metoprolol this morning, which she takes PRN up to twice a day for her episodes.  She notes she only took 1/4 tablet of metoprolol bc she is very sensitive to the dosing and already runs consistently low BPs. States prior to change of metoprolol from daily to PRN, she was taking prescribed dose and her BP was dipping to 80s/60s.   Advised if the metoprolol is helping resolve symptoms and she's already been taking this PRN as recommended, to continue to do so. Pt expressed that she would like to figure out what is going on as she is frustrated by the recurrence of her palpitations. Notes these really only started after she got the DVT.  Pt aware I'll seek review by Dr. Antoine PocheHochrein & communicate his recommendations.

## 2016-11-15 ENCOUNTER — Encounter: Payer: Self-pay | Admitting: Cardiology

## 2016-11-16 NOTE — Telephone Encounter (Signed)
I have responded to this through My Chart patient portal.

## 2016-11-18 ENCOUNTER — Encounter: Payer: Self-pay | Admitting: Family

## 2016-11-19 ENCOUNTER — Telehealth: Payer: Self-pay | Admitting: *Deleted

## 2016-11-19 MED ORDER — DILTIAZEM HCL 30 MG PO TABS: 30.0000 mg | ORAL_TABLET | Freq: Two times a day (BID) | ORAL | 11 refills | Status: DC

## 2016-11-19 NOTE — Telephone Encounter (Signed)
-----   Message from [name], MD sent at 11/19/2016 10:49 AM EDT ----- Stop her beta blocker.  Start Cardizem IR 30 mg po BID.  Dips number 60 with 11 refills.   

## 2016-11-19 NOTE — Telephone Encounter (Signed)
Metoprolol D/C and Cardizem 30 mg 1 tablets BID send into pt [facility]

## 2016-11-19 NOTE — Telephone Encounter (Signed)
-----   Message from Rollene RotundaJames Hochrein, MD sent at 11/19/2016 10:49 AM EDT ----- Stop her beta blocker.  Start Cardizem IR 30 mg po BID.  Dips number 60 with 11 refills.

## 2016-11-20 ENCOUNTER — Other Ambulatory Visit: Payer: Self-pay

## 2016-11-20 ENCOUNTER — Encounter (HOSPITAL_COMMUNITY): Payer: Self-pay | Admitting: *Deleted

## 2016-11-20 ENCOUNTER — Emergency Department (HOSPITAL_COMMUNITY): Payer: BC Managed Care – PPO

## 2016-11-20 ENCOUNTER — Emergency Department (HOSPITAL_COMMUNITY)
Admission: EM | Admit: 2016-11-20 | Discharge: 2016-11-20 | Disposition: A | Discharge status: Home / Self Care | Payer: BC Managed Care – PPO | Attending: Emergency Medicine | Admitting: Emergency Medicine

## 2016-11-20 DIAGNOSIS — R110 Nausea: Secondary | ICD-10-CM | POA: Insufficient documentation

## 2016-11-20 DIAGNOSIS — J45909 Unspecified asthma, uncomplicated: Secondary | ICD-10-CM | POA: Insufficient documentation

## 2016-11-20 DIAGNOSIS — L231 Allergic contact dermatitis due to adhesives: Secondary | ICD-10-CM | POA: Insufficient documentation

## 2016-11-20 DIAGNOSIS — R079 Chest pain, unspecified: Secondary | ICD-10-CM | POA: Diagnosis not present

## 2016-11-20 DIAGNOSIS — Z86711 Personal history of pulmonary embolism: Secondary | ICD-10-CM | POA: Diagnosis not present

## 2016-11-20 DIAGNOSIS — R002 Palpitations: Secondary | ICD-10-CM | POA: Diagnosis not present

## 2016-11-20 DIAGNOSIS — Z79899 Other long term (current) drug therapy: Secondary | ICD-10-CM | POA: Insufficient documentation

## 2016-11-20 DIAGNOSIS — Z87891 Personal history of nicotine dependence: Secondary | ICD-10-CM | POA: Insufficient documentation

## 2016-11-20 DIAGNOSIS — Z7901 Long term (current) use of anticoagulants: Secondary | ICD-10-CM | POA: Insufficient documentation

## 2016-11-20 LAB — CBC
HEMATOCRIT: 38.4 % (ref 36.0–46.0)
Hemoglobin: 13 g/dL (ref 12.0–15.0)
MCH: 29.8 pg (ref 26.0–34.0)
MCHC: 33.9 g/dL (ref 30.0–36.0)
MCV: 88.1 fL (ref 78.0–100.0)
PLATELETS: 188 10*3/uL (ref 150–400)
RBC: 4.36 MIL/uL (ref 3.87–5.11)
RDW: 12.4 % (ref 11.5–15.5)
WBC: 6.6 10*3/uL (ref 4.0–10.5)

## 2016-11-20 LAB — BASIC METABOLIC PANEL
Anion gap: 10 (ref 5–15)
BUN: 7 mg/dL (ref 6–20)
CO2: 24 mmol/L (ref 22–32)
CREATININE: 0.76 mg/dL (ref 0.44–1.00)
Calcium: 9.2 mg/dL (ref 8.9–10.3)
Chloride: 105 mmol/L (ref 101–111)
GFR calc Af Amer: >60 mL/min (ref >60)
GLUCOSE: 127 mg/dL — AB (ref 65–99)
POTASSIUM: 3.6 mmol/L (ref 3.5–5.1)
SODIUM: 139 mmol/L (ref 135–145)

## 2016-11-20 LAB — TROPONIN I: Troponin I: <0.03 ng/mL (ref <0.03)

## 2016-11-20 MED ORDER — SODIUM CHLORIDE 0.9 % IV BOLUS (SEPSIS)
1000.0000 mL | Freq: Once | INTRAVENOUS | Status: AC
  Administered 2016-11-20: 1000 mL via INTRAVENOUS

## 2016-11-20 NOTE — ED Triage Notes (Signed)
The pt is c/o lt sided chest pain today  She has a monitor  And it told her that she was in af so she came in  lmp 2 weeks ago  She takes xarelto  For a past pe

## 2016-11-20 NOTE — ED Provider Notes (Signed)
[facility]-EMERGENCY DEPT Provider Note   CSN: [id number] Arrival date & time: 11/20/16  1741     History   Chief Complaint Chief Complaint  Patient presents with  . Chest Pain    HPI Erica Ho is a 36 y.o. female.  HPI  36 y.o. female with a hx of Heart Palpitations, PE on Xarelto, presents to the Emergency Department today due to left sided chest pain today. Hx same. Pt currently on monitor and her text on her phone said she may have atrial fibrillation. Pt states chest pain occurred at rest with palpitations. Noted on left side of chest and sharp sensation. Denies shortness of breath. Lasted for 10-15 min and then dissipated. Pt took a beta blocker with resolution of symptoms. Notes nausea without emesis. No diaphoresis. No numbness/tingling. Denies chest pain currently. Seen in ED on 11-02-16 for same. Hx PE. CT Angio negative. Pt followed by Cardiology for PACs and PVCs. No fevers. No abdominal pain. No syncope/near syncope. Pt is scheduled to have bubble study done in future to eval for PFO. No other symptoms noted.    Cardiologist: Rollene Rotunda, MD  Past Medical History:  Diagnosis Date  . Asthma   . Heart palpitations    a. 05/2016: event monitor showing PAC's with one episode of 4 beats NSVT  . History of migraine headaches   . PE (pulmonary thromboembolism) (HCC)    a. 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 Varicose veins     Patient Active Problem List   Diagnosis Date Noted  . Acute pulmonary embolism (HCC) 05/03/2016    Past Surgical History:  Procedure Laterality Date  . CESAREAN SECTION    . CHOLECYSTECTOMY    . TONSILLECTOMY    . TUBAL LIGATION      OB History    No data available       Home Medications    Prior to Admission medications   Medication Sig Start Date End Date Taking? Authorizing Provider  diltiazem (CARDIZEM) 30 MG tablet Take 1 tablet (30 mg total) by mouth 2 (two) times daily. 11/19/16   Rollene Rotunda, MD  ferrous sulfate 325 (65  FE) MG EC tablet Take 325 mg by mouth daily. 05/21/16   [provider]  rivaroxaban (XARELTO) 20 MG TABS tablet Take 20 mg by mouth daily with supper.    [provider]    Family History Family History  Problem Relation Age of Onset  . Diabetes Mother   . Clotting disorder Neg Hx     Social History Social History  Substance Use Topics  . Smoking status: Former Smoker    Quit date: 08/26/2011  . Smokeless tobacco: Never Used  . Alcohol use No     Allergies   Imitrex [sumatriptan]; Isovue [iopamidol]; Levofloxacin; Levaquin [levofloxacin in d5w]; and Tape   Review of Systems Review of Systems ROS reviewed and all are negative for acute change except as noted in the HPI.  Physical Exam Updated Vital Signs BP 133/87   Pulse 98   Temp 97.7 F (36.5 C)   Resp 18   Ht 5\' 1"  (1.549 m)   Wt 62.7 kg (138 lb 3 oz)   LMP 11/06/2016   SpO2 100%   BMI 26.11 kg/m   Physical Exam  Constitutional: She is oriented to person, place, and time. Vital signs are normal. She appears well-developed and well-nourished.  HENT:  Head: Normocephalic and atraumatic.  Right Ear: Hearing normal.  Left Ear: Hearing normal.  Eyes: Conjunctivae and EOM are normal. Pupils are equal, round, and reactive to light.  Neck: Normal range of motion. Neck supple.  Cardiovascular: Normal rate, regular rhythm, normal heart sounds and intact distal pulses.   Pulmonary/Chest: Effort normal and breath sounds normal.  Abdominal: Soft. There is no tenderness.  Musculoskeletal: Normal range of motion.  Neurological: She is alert and oriented to person, place, and time.  Skin: Skin is warm and dry.  Psychiatric: She has a normal mood and affect. Her speech is normal and behavior is normal. Thought content normal.  Nursing note and vitals reviewed.  ED Treatments / Results  Labs (all labs ordered are listed, but only abnormal results are displayed) Labs Reviewed  BASIC METABOLIC PANEL -  Abnormal; Notable for the following:       Result Value   Glucose, Bld 127 (*)    All other components within normal limits  CBC  TROPONIN I   EKG  EKG Interpretation  Date/Time:  Saturday November 20 2016 17:47:25 EDT Ventricular Rate:  103 PR Interval:  146 QRS Duration: 84 QT Interval:  342 QTC Calculation: 448 R Axis:   94 Text Interpretation:  Sinus tachycardia Rightward axis Borderline ECG since last tracing no significant change Confirmed by Rolan BuccoBelfi, Melanie [phone]([id number]) on 11/20/2016 6:12:03 PM      Radiology Dg Chest 2 View  Result Date: 11/20/2016 CLINICAL DATA:  Patient reports rapid heart rate since last Dec, when she had a blood clot in her lung, patient reports she has had generalized chest pain that radiates to her back before the rapid heart rate begins. EXAM: CHEST  2 VIEW COMPARISON:  Chest x-ray dated 11/11/2016. FINDINGS: The heart size and mediastinal contours are within normal limits. Both lungs are clear. The visualized skeletal structures are unremarkable. IMPRESSION: No active cardiopulmonary disease. No evidence of pneumonia or pulmonary edema. Electronically Signed   By: Bary RichardStan  Maynard M.D.   On: 11/20/2016 20:23    Procedures Procedures (including critical care time)  Medications Ordered in ED Medications  sodium chloride 0.9 % bolus 1,000 mL (1,000 mLs Intravenous New Bag/Given 11/20/16 1936)   Initial Impression / Assessment and Plan / ED Course  I have reviewed the triage vital signs and the nursing notes.  Pertinent labs & imaging results that were available during my care of the patient were reviewed by me and considered in my medical decision making (see chart for details).  Final Clinical Impressions(s) / ED Diagnoses  {I have reviewed and evaluated the relevant laboratory values. {I have reviewed and evaluated the relevant imaging studies. {I have interpreted the relevant EKG. {I have reviewed the relevant previous healthcare records.  {I obtained HPI  from historian. {Patient discussed with supervising physician.  ED Course:  Assessment: Pt is a 36 y.o. female presents with CP 1 hour PTA. Hx same. Hx palpitations and follow by Cardiology for this. Duration 10-15 min. Nausea without emesis. Areas was left sided and sharp sensation. No radiation. No hx ACS.pt took beta blocker for palpations with resolution. Denies pain currently. Patient is to be discharged with recommendation to follow up with Cardiology in regards to today's hospital visit. Pt has follow up for bubble study for possible PFO. Chest pain is not likely of cardiac or pulmonary etiology d/t presentation VSS, no tracheal deviation, no JVD or new murmur, RRR, breath sounds equal bilaterally, EKG without acute abnormalities, negative troponin, and negative CXR. Pt is on Xarelto for previous PE. Recent CT angio negative this month.  Pt has been advised to the ED is CP becomes exertional, associated with diaphoresis or nausea, radiates to left jaw/arm, worsens or becomes concerning in any way. Pt appears reliable for follow up and is agreeable to discharge. Patient is in no acute distress. Vital Signs are stable. Patient is able to ambulate. Patient able to tolerate PO.   Disposition/Plan:  DC Home Additional Verbal discharge instructions given and discussed with patient.  Pt Instructed to f/u with PCP in the next week for evaluation and treatment of symptoms. Return precautions given Pt acknowledges and agrees with plan  Supervising Physician Little, Ambrose Finland, MD  Final diagnoses:  Chest pain, unspecified type  Palpitations    New Prescriptions New Prescriptions   No medications on file     Audry Pili, Cordelia Poche 11/20/16 2026    Little, Ambrose Finland, MD 11/21/16 1511

## 2016-11-20 NOTE — Discharge Instructions (Addendum)
Please read and follow all provided instructions.  Your diagnoses today include:  1. Chest pain, unspecified type   2. Palpitations     Tests performed today include: An EKG of your heart A chest x-ray Cardiac enzymes - a blood test for heart muscle damage Blood counts and electrolytes Vital signs. See below for your results today.   Medications prescribed:   Take any prescribed medications only as directed.  Follow-up instructions: Please follow-up with your Cardiologist for further evaluation of your symptoms.   Return instructions:  SEEK IMMEDIATE MEDICAL ATTENTION IF: You have severe chest pain, especially if the pain is crushing or pressure-like and spreads to the arms, back, neck, or jaw, or if you have sweating, nausea (feeling sick to your stomach), or shortness of breath. THIS IS AN EMERGENCY. Don't wait to see if the pain will go away. Get medical help at once. Call 911 or 0 (operator). DO NOT drive yourself to the hospital.  Your chest pain gets worse and does not go away with rest.  You have an attack of chest pain lasting longer than usual, despite rest and treatment with the medications your caregiver has prescribed.  You wake from sleep with chest pain or shortness of breath. You feel dizzy or faint. You have chest pain not typical of your usual pain for which you originally saw your caregiver.  You have any other emergent concerns regarding your health.  Additional Information: Chest pain comes from many different causes. Your caregiver has diagnosed you as having chest pain that is not specific for one problem, but does not require admission.  You are at low risk for an acute heart condition or other serious illness.   Your vital signs today were: BP 133/87    Pulse 98    Temp 97.7 F (36.5 C)    Resp 18    Ht 5\' 1"  (1.549 m)    Wt 62.7 kg (138 lb 3 oz)    LMP 11/06/2016    SpO2 100%    BMI 26.11 kg/m  If your blood pressure (BP) was elevated above 135/85 this  visit, please have this repeated by your doctor within one month. --------------

## 2016-11-22 ENCOUNTER — Telehealth: Payer: Self-pay | Admitting: Cardiology

## 2016-11-22 NOTE — Telephone Encounter (Signed)
Patient was taking Cardizem 30 mg bid which she stated caused her blood pressure to drop too low, 92/60 and 80/62. She took a quarter of her Metoprolol again to help with the palpitations but is still experiencing occasional palpitations and a racing heart.

## 2016-11-22 NOTE — Telephone Encounter (Signed)
I sent a request to my nurse to change the patient to Cardizem.  Please check with Nya.

## 2016-11-22 NOTE — Telephone Encounter (Signed)
Please call,heart racing so much and big palpitations.She thinks she might need to be seen.

## 2016-11-22 NOTE — Telephone Encounter (Signed)
Patient stated that she has had palpitations off and on for a while now. Recently her Metoprolol has been discontinued due to low blood pressure. She was started on Cardizem 30 mg bid. This has not helped the racing heart and palpitations. While on it, Saturday her bp was 92/60 and heart rate had reached 150. She recently went to the ED on 6/23 because her at home monitor stated that she was in afib which was not the case. Per the patient, all the monitor readings have been sent to the office.   The patient took her quarter tab Metoprolol today which did help relieve the racing heart but it makes her feel tired. She stated that she eats salt to increase her blood pressure so that she can take the medication. She is scheduled for an ECHO with bubble study on 7/9. An appointment has been made for her with Theodore Demarkhonda Barrett, PA on Monday 7/2 at 9am.  She has recently started PT for back pain. Her heart racing and palpitations have been worse since she started the therapy. She wonders if it is related. Will send scheduling a message to see if the ECHO with Bubble study can be moved up, per the patient's request. Will route to the provider for his knowledge and any further recommendations.

## 2016-11-24 NOTE — Telephone Encounter (Signed)
Pt already have an appt with Theodore Demarkhonda Barrett on Monday @ 9:30, do you still want me to put her on your schedule.

## 2016-11-24 NOTE — Telephone Encounter (Signed)
No.  She can see Bjorn Loserhonda

## 2016-11-24 NOTE — Telephone Encounter (Signed)
Please have her come to see me in the office on Monday morning to discuss.

## 2016-11-28 DIAGNOSIS — I739 Peripheral vascular disease, unspecified: Secondary | ICD-10-CM

## 2016-11-28 HISTORY — DX: Peripheral vascular disease, unspecified: I73.9

## 2016-11-29 ENCOUNTER — Encounter: Payer: Self-pay | Admitting: Physician Assistant

## 2016-11-29 ENCOUNTER — Ambulatory Visit (INDEPENDENT_AMBULATORY_CARE_PROVIDER_SITE_OTHER): Payer: BC Managed Care – PPO | Admitting: Physician Assistant

## 2016-11-29 ENCOUNTER — Ambulatory Visit (INDEPENDENT_AMBULATORY_CARE_PROVIDER_SITE_OTHER): Payer: BC Managed Care – PPO | Admitting: Adult Health

## 2016-11-29 ENCOUNTER — Encounter: Payer: Self-pay | Admitting: Adult Health

## 2016-11-29 VITALS — BP 98/70 | HR 76 | Ht 61.0 in | Wt 138.0 lb

## 2016-11-29 DIAGNOSIS — I152 Hypertension secondary to endocrine disorders: Secondary | ICD-10-CM

## 2016-11-29 DIAGNOSIS — I4892 Unspecified atrial flutter: Secondary | ICD-10-CM | POA: Diagnosis not present

## 2016-11-29 DIAGNOSIS — H53121 Transient visual loss, right eye: Secondary | ICD-10-CM

## 2016-11-29 DIAGNOSIS — Z8669 Personal history of other diseases of the nervous system and sense organs: Secondary | ICD-10-CM | POA: Diagnosis not present

## 2016-11-29 DIAGNOSIS — R002 Palpitations: Secondary | ICD-10-CM | POA: Diagnosis not present

## 2016-11-29 DIAGNOSIS — E348 Other specified endocrine disorders: Secondary | ICD-10-CM | POA: Diagnosis not present

## 2016-11-29 DIAGNOSIS — Z86711 Personal history of pulmonary embolism: Secondary | ICD-10-CM

## 2016-11-29 DIAGNOSIS — I2699 Other pulmonary embolism without acute cor pulmonale: Secondary | ICD-10-CM

## 2016-11-29 NOTE — Patient Instructions (Addendum)
Palpitations A palpitation is the feeling that your heartbeat is irregular or is faster than normal. It may feel like your heart is fluttering or skipping a beat. Palpitations are usually not a serious problem. They may be caused by many things, including smoking, caffeine, alcohol, stress, and certain medicines. Although most causes of palpitations are not serious, palpitations can be a sign of a serious medical problem. In some cases, you may need further medical evaluation. Follow these instructions at home: Pay attention to any changes in your symptoms. Take these actions to help with your condition:  Avoid the following: ? Caffeinated coffee, tea, soft drinks, diet pills, and energy drinks. ? Chocolate. ? Alcohol.  Do not use any tobacco products, such as cigarettes, chewing tobacco, and e-cigarettes. If you need help quitting, ask your health care provider.  Try to reduce your stress and anxiety. Things that can help you relax include: ? Yoga. ? Meditation. ? Physical activity, such as swimming, jogging, or walking. ? Biofeedback. This is a method that helps you learn to use your mind to control things in your body, such as your heartbeats.  Get plenty of rest and sleep.  Take over-the-counter and prescription medicines only as told by your health care provider.  Keep all follow-up visits as told by your health care provider. This is important.  Contact a health care provider if:  You continue to have a fast or irregular heartbeat after 24 hours.  Your palpitations occur more often. Get help right away if:  You have chest pain or shortness of breath.  You have a severe headache.  You feel dizzy or you faint. This information is not intended to replace advice given to you by your health care provider. Make sure you discuss any questions you have with your health care provider. Document Released: 05/14/2000 Document Revised: 10/20/2015 Document Reviewed: 01/30/2015 Elsevier  Interactive Patient Education  2017 Elsevier Inc.    Please continue medications as directed and close follow-up by cardiology.  Continue healthy eating and excellent water intake. Follow-up every 6 months, sooner if needed.  WELCOME TO THE PRACTICE!

## 2016-11-29 NOTE — Progress Notes (Signed)
Subjective:    Patient ID: Erica Ho, female    DOB: April 18, 1981, 36 y.o.   MRN: [id number]  HPI:  Erica Ho is here to establish as a new pt.  She is a very pleasant 36 year old female.  PMH:  Migraine HA, palpitations, spontaneous PE (Dec 2017), thoracic spine pain, and LLE varicose veins.  She quit smoking 2012.  Migraine HA began in teens and sx's occurred 1-2 times a year, then greatly increased in frequency Fall 2017.   Palpitations began Nov 2017 and she developed spontaneous PE Dec 2017.  She has been on daily Xarelto 20mg  daily and Toprol 25mg  1/4 tablet PRN palpitations.  She estimates to use 6-8 doses of Toprol weekly.  She is followed closely by cards, last OV was this am.   She has appt with vascular surgeon tomorrow to address significant LLE varicose veins that have been present > 8 years. She denies migraine HA since beginning Xarelto 20mg . 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 She estimates to dirnk > 80ounces water/daily and eats a heart healthy diet.  She has been unable to exercise on regular basis lately, due to intermittent dizziness/palpitations and LLE pain. She is working with formal PT 2times/week to correct "twisted thoracic spine" for the last month.  Patient Care Team    Relationship Specialty Notifications Start End  William Hamburger D, NP PCP - General Family Medicine  11/29/16   Rollene Rotunda, MD Consulting Physician Cardiology  11/29/16   Marcelle Overlie, MD Consulting Physician Obstetrics and Gynecology  11/29/16     Patient Active Problem List   Diagnosis Date Noted  . Family history of autoimmune disorder 06/21/2016  . History of pulmonary embolism 05/26/2016  . Panic disorder 05/19/2016  . Anemia 05/17/2016  . History of migraine headaches 05/04/2016  . Acute pulmonary embolism (HCC) 05/03/2016  . Palpitation 05/03/2016  . Ocular migraine 04/20/2016  . Gastroesophageal reflux disease without esophagitis 04/20/2016   . Varicose veins of left lower extremity with pain 09/18/2015  . Atrial fibrillation (HCC) 06/09/2015  . Hyperthyroidism 06/09/2015  . Electrolyte disorder 06/09/2015  . Dizziness 06/09/2015  . Sinus tachycardia 06/09/2015  . Atrial flutter (HCC) 06/09/2015     Past Medical History:  Diagnosis Date  . Asthma   . Heart palpitations    a. 05/2016: event monitor showing PAC's with one episode of 4 beats NSVT  . History of migraine headaches   . PE (pulmonary thromboembolism) (HCC)    a. 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 Varicose veins      Past Surgical History:  Procedure Laterality Date  . CESAREAN SECTION    . CHOLECYSTECTOMY    . TONSILLECTOMY    . TUBAL LIGATION       Family History  Problem Relation Age of Onset  . Diabetes Mother   . Clotting disorder Neg Hx      History  Drug Use No     History  Alcohol Use No     History  Smoking Status  . Former Smoker  . Quit date: 08/26/2011  Smokeless Tobacco  . Never Used     Outpatient Encounter Prescriptions as of 11/29/2016  Medication Sig Note  . metoprolol tartrate (LOPRESSOR) 25 MG tablet Take by mouth 3 (three) times daily. Takes 0.25mg  up to 3 times a day   . rivaroxaban (XARELTO) 20 MG TABS tablet Take 20 mg by mouth daily with supper.   . [DISCONTINUED]  diltiazem (CARDIZEM) 30 MG tablet Take 1 tablet (30 mg total) by mouth 2 (two) times daily. (Patient not taking: Reported on 11/29/2016) 11/20/2016: Was prescribed this just yesterday; hasn't started yet  . [DISCONTINUED] metoprolol tartrate (LOPRESSOR) 25 MG tablet Take 6.25 mg by mouth See admin instructions. TWO TIMES A DAY AND MAY TAKE AN ADDITIONAL 6.25 MG ONCE DAILY FOR PALPITATIONS 11/20/2016: Per the patient, she SHOULD be going off of this and begin taking Cardizem after she discusses today's happenings with her cardiologist this coming Monday-   No facility-administered encounter medications on file as of 11/29/2016.     Allergies: Imitrex  [sumatriptan]; Cardizem [diltiazem]; Isovue [iopamidol]; Levofloxacin; Nsaids; and Levaquin [levofloxacin in d5w]  Body mass index is 26.64 kg/m.  Blood pressure 100/64, pulse 61, height 5\' 1"  (1.549 m), weight 141 lb (64 kg), last menstrual period 11/06/2016.     Review of Systems  Constitutional: Positive for fatigue and unexpected weight change. Negative for activity change, appetite change, chills, diaphoresis and fever.  HENT: Negative for congestion.   Eyes: Negative for visual disturbance.  Respiratory: Negative for cough, chest tightness, shortness of breath, wheezing and stridor.   Cardiovascular: Positive for leg swelling. Negative for chest pain and palpitations.       LLE edema with varicose veins.  Gastrointestinal: Negative for abdominal distention, abdominal pain, blood in stool, constipation, diarrhea, nausea and vomiting.  Endocrine: Negative for cold intolerance, heat intolerance, polydipsia, polyphagia and polyuria.  Genitourinary: Negative for difficulty urinating, flank pain and hematuria.  Musculoskeletal: Positive for arthralgias and back pain. Negative for gait problem and joint swelling.  Skin: Negative for color change, pallor, rash and wound.  Allergic/Immunologic: Negative for immunocompromised state.  Neurological: Negative for dizziness, tremors, weakness and headaches.  Hematological: Bruises/bleeds easily.  Psychiatric/Behavioral: Negative for dysphoric mood and sleep disturbance. The patient is not nervous/anxious.        Objective:   Physical Exam  Constitutional: She is oriented to person, place, and time. She appears well-developed and well-nourished. No distress.  HENT:  Head: Normocephalic and atraumatic.  Right Ear: Hearing, tympanic membrane, external ear and ear canal normal. No decreased hearing is noted.  Left Ear: Hearing, tympanic membrane, external ear and ear canal normal. No decreased hearing is noted.  Nose: Nose normal. Right sinus  exhibits no maxillary sinus tenderness and no frontal sinus tenderness. Left sinus exhibits no maxillary sinus tenderness and no frontal sinus tenderness.  Mouth/Throat: Uvula is midline, oropharynx is clear and moist and mucous membranes are normal.  Eyes: Conjunctivae are normal. Pupils are equal, round, and reactive to light.  Neck: Normal range of motion. Neck supple.  Cardiovascular: Normal rate, regular rhythm, normal heart sounds and intact distal pulses.   No murmur heard. Pulmonary/Chest: Effort normal. No respiratory distress. She has no wheezes. She has no rales. She exhibits no tenderness.  Musculoskeletal: Normal range of motion. She exhibits tenderness.  Significant varicose veins noted on LLE. Pt wearing toe toe to hip compression hose, 20-30 mmHg.  Lymphadenopathy:    She has no cervical adenopathy.  Neurological: She is alert and oriented to person, place, and time. Coordination normal.  Skin: Skin is warm and dry. No rash noted. She is not diaphoretic. No erythema. No pallor.  Psychiatric: She has a normal mood and affect. Her behavior is normal. Judgment and thought content normal.  Nursing note and vitals reviewed.         Assessment & Plan:   1. Other acute pulmonary embolism without  acute cor pulmonale (HCC)   2. Atrial flutter, unspecified type (HCC)   3. History of migraine headaches     Acute pulmonary embolism (HCC) Continue Xarelto 20mg  daily. Managed by Cards.  Atrial flutter (HCC) Treated with Toprol 25mg  1/4 tablet PRN palpitations. EKG 11/29/2016 at cards this am: EKG:  EKG is ordered today. The ekg ordered today demonstrates SR, no acute changes  History of migraine headaches Reports last migraine was Dec 2017. Previously [place]rolog[place] in [place] a few years ago.    FOLLOW-UP:  Return in about 6 months (around 06/01/2017) for Regular Follow Up.

## 2016-11-29 NOTE — Assessment & Plan Note (Signed)
Continue Xarelto 20mg  daily. Managed by Cards.

## 2016-11-29 NOTE — Assessment & Plan Note (Signed)
Reports last migraine was Dec 2017. Previously followed [place]rolog[place] in [place] a few years ago.

## 2016-11-29 NOTE — Assessment & Plan Note (Signed)
Treated with Toprol 25mg  1/4 tablet PRN palpitations. EKG 11/29/2016 at cards this am: EKG:  EKG is ordered today. The ekg ordered today demonstrates SR, no acute changes

## 2016-11-29 NOTE — Progress Notes (Signed)
Cardiology Office Note   Date:  11/29/2016   ID:  Erica Ho, DOB [birth year], MRN [id number]  PCP:  Nonnie Done., MD  Cardiologist:  Dr Antoine Poche 10/06/2016 Dr Graciela Husbands 11/05/2016 Erica Demark, PA-C   Chief Complaint  Patient presents with  . Follow-up  . Palpitations  . Chest Pain  . Dizziness    History of Present Illness: Erica Ho is a 36 y.o. female with a history of Heart Palpitations, PE on Xarelto, event monitor 04/2016 w/ ST and occ PVCs and 4 bt run NSVT. Pt has been on low dose metoprolol, Cardizem also, BP tolerates poorly  06/23 ER visit for CP, palpitations, ST rate 103 on ECG  Erica Ho presents for cardiology follow up  She has continued to have episodes of palpitations and rapid HR. If she does not take the BB, she will have them. She cannot always take the BB because her BP is too low.   She still has the pain going from her back into her chest area (evaluation of this led to PE dx 04/2016). She is working with therapists that have told her her back is twisted and they want to "pop it" back into place.   She has not had any vision loss issues since being on the Xarelto. Prev dx ocular migraines but never had HA with the vision losses.   Strips reviewed from [facility], she has had ST up to 150 bpm, episodic. She reports HR up to 180. When her HR goes up, her BP will get high as well, SBP up to 200. She feels bad when she gets these episodes. There is no cause, they can happen when she is sitting, watching TV. All strips reviewed from her phone are SR/ST. No afib seen.   She gets light-headed at times, these sx are not associated with any lack of food or water. She eats well and feels she gets plenty of water. She was told to get more salt in her diet, she is trying to add it.    Past Medical History:  Diagnosis Date  . Asthma   . Heart palpitations    a. 05/2016: event monitor showing PAC's with one episode of 4 beats NSVT  .  History of migraine headaches   . PE (pulmonary thromboembolism) (HCC)    a. 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 Varicose veins     Past Surgical History:  Procedure Laterality Date  . CESAREAN SECTION    . CHOLECYSTECTOMY    . TONSILLECTOMY    . TUBAL LIGATION      Current Outpatient Prescriptions  Medication Sig Dispense Refill  . metoprolol tartrate (LOPRESSOR) 25 MG tablet Take 6.25 mg by mouth See admin instructions. TWO TIMES A DAY AND MAY TAKE AN ADDITIONAL 6.25 MG ONCE DAILY FOR PALPITATIONS    . rivaroxaban (XARELTO) 20 MG TABS tablet Take 20 mg by mouth daily with supper.    . diltiazem (CARDIZEM) 30 MG tablet Take 1 tablet (30 mg total) by mouth 2 (two) times daily. (Patient not taking: Reported on 11/29/2016) 60 tablet 11   No current facility-administered medications for this visit.     Allergies:   Imitrex [sumatriptan]; Isovue [iopamidol]; Levofloxacin; Nsaids; and Levaquin [levofloxacin in d5w]    Social History:  The patient  reports that she quit smoking about 5 years ago. She has never used smokeless tobacco. She reports that she does not drink alcohol or use drugs.   Family History:  The patient's family history includes Diabetes in her mother.    ROS:  Please see the history of present illness. All other systems are reviewed and negative.    PHYSICAL EXAM: VS:  BP 98/70   Pulse 76   Ht 5\' 1"  (1.549 m)   Wt 138 lb (62.6 kg)   LMP 11/06/2016   BMI 26.07 kg/m  , BMI Body mass index is 26.07 kg/m. GEN: Well nourished, well developed, female in no acute distress  HEENT: normal for age  Neck: no JVD, no carotid bruit, no masses Cardiac: RRR; slight murmur, no rubs, or gallops Respiratory:  clear to auscultation bilaterally, normal work of breathing GI: soft, nontender, nondistended, + BS MS: no deformity or atrophy; no edema; distal pulses are 2+ in all 4 extremities   Skin: warm and dry, no rash Neuro:  Strength and sensation are intact Psych: euthymic  mood, full affect   EKG:  EKG is ordered today. The ekg ordered today demonstrates SR, no acute changes   Recent Labs: 05/21/2016: TSH 1.68 11/01/2016: Magnesium 2.1 11/20/2016: BUN 7; Creatinine, Ser 0.76; Hemoglobin 13.0; Platelets 188; Potassium 3.6; Sodium 139    Lipid Panel No results found for: CHOL, TRIG, HDL, CHOLHDL, VLDL, LDLCALC, LDLDIRECT   Wt Readings from Last 3 Encounters:  11/29/16 138 lb (62.6 kg)  11/20/16 138 lb 3 oz (62.7 kg)  11/05/16 138 lb (62.6 kg)     Other studies Reviewed: Additional studies/ records that were reviewed today include: office notes, hospital records and testing.  ASSESSMENT AND PLAN:  1.  Palpitations associated with HTN: She has a bubble study scheduled ordered by Dr Graciela HusbandsKlein. Her sx are not consistent with POTS, as they can happen at rest. She has been losing weight steadily, despite good po intake. Will ck 24 hr urine for catecholamines and VMA, eval for pheo.   2. Transient vision loss: sx have not recurred since being started on Xarelto. Get bubble study to r/o PFO.  3. PE: unprovoked, continue Xarelto   Current medicines are reviewed at length with the patient today.  The patient does not have concerns regarding medicines.  The following changes have been made:  no change  Labs/ tests ordered today include:  No orders of the defined types were placed in this encounter.    Disposition:   FU with Dr Antoine PocheHochrein and Dr Graciela HusbandsKlein  Signed, Erica Ho, Erica Lafosse, PA-C  11/29/2016 9:18 AM    [facility] Phone: [phone]; Fax: [phone]  This note was written with the assistance of speech recognition software. Please excuse any transcriptional errors.

## 2016-11-29 NOTE — Patient Instructions (Signed)
Medication Instructions:  Your physician recommends that you continue on your current medications as directed. Please refer to the Current Medication list given to you today.  Labwork: Your physician recommends that you return for lab work in: TODAY--24 HOUR SAMPLE STOP AT LAB  Testing/Procedures: None   Follow-Up: KEEP UPCOMING APPOINTMENT WITH [facility]  Any Other Special Instructions Will Be Listed Below (If Applicable).   If you need a refill on your cardiac medications before your next appointment, please call your pharmacy.

## 2016-11-30 ENCOUNTER — Encounter: Payer: Self-pay | Admitting: Vascular Surgery

## 2016-11-30 ENCOUNTER — Ambulatory Visit (INDEPENDENT_AMBULATORY_CARE_PROVIDER_SITE_OTHER): Payer: BC Managed Care – PPO | Admitting: Vascular Surgery

## 2016-11-30 VITALS — BP 108/74 | HR 88 | Temp 98.2°F | Resp 18 | Ht 61.0 in | Wt 135.0 lb

## 2016-11-30 DIAGNOSIS — I83892 Varicose veins of left lower extremities with other complications: Secondary | ICD-10-CM | POA: Diagnosis not present

## 2016-11-30 NOTE — Progress Notes (Signed)
Subjective:     Patient ID: Erica Ho, female   DOB: 08/03/1980, 36 y.o.   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 She has a history of thrombophlebitis in the left leg on 3 occasions with 1 episode of thrombosis of the left small saphenous vein. She was evaluated with an ultrasound in December 2016 and was found to have gross reflux and large left great saphenous vein supplying these painful varicosities with no DVT. She was prescribed long leg elastic compression stockings 20-30 millimeter gradient by Dr. Malachy MoanHeath McCullough and she has worn them since that time. It does not relieve the heavy aching throbbing discomfort which develops in her calf as the day progresses but it does prevent some of the swelling which occurs. She also had history of a spontaneous pulmonary embolus in December 2017 and since that time has been on [place]. She is also followed by Dr. Antoine PocheHochrein for a cardiac arrhythmia. She has no history of coronary artery disease or previous myocardial infarction. She is on her feet all day because of her job and her symptoms worsen as the day progresses. Her symptoms are affecting her daily living and ability to work.  Past Medical History:  Diagnosis Date  . Asthma   . Heart palpitations    a. 05/2016: event monitor showing PAC's with one episode of 4 beats NSVT  . History of migraine headaches   . PE (pulmonary thromboembolism) (HCC)    a. 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 Varicose veins     Social History  Substance Use Topics  . Smoking status: Former Smoker    Quit date: 08/26/2011  . Smokeless tobacco: Never Used  . Alcohol use No    Family History  Problem Relation Age of Onset  . Diabetes Mother   . Clotting disorder Neg Hx     Allergies  Allergen Reactions  . Imitrex [Sumatriptan] Palpitations  . Cardizem [Diltiazem]     Caused blood pressure to drop   . Isovue [Iopamidol] Other (See Comments)    Couldn't focus eyes well afterwards; things looked like they had shadows  . Levofloxacin Other (See Comments)    Skin felt like it was on "fire"  . Nsaids Other (See Comments)    Cannot take because she is taking Xarelto now  . Levaquin [Levofloxacin In D5w] Palpitations     Current Outpatient Prescriptions:  .  ferrous sulfate 325 (65 FE) MG EC tablet, Take 325 mg by mouth 3 (three) times daily with meals., Disp: , Rfl:  .  metoprolol tartrate (LOPRESSOR) 25 MG tablet, Take by mouth 3 (three) times daily. Takes 0.25mg  up to 3 times a day, Disp: , Rfl:  .  rivaroxaban (XARELTO) 20 MG TABS tablet, Take 20 mg by mouth daily with supper., Disp: , Rfl:   Vitals:   11/30/16 1312  BP: 108/74  Pulse: 88  Resp: 18  Temp: 98.2 F (36.8 C)  TempSrc: Oral  SpO2: 99%  Weight: 135 lb (61.2 kg)  Height: 5\' 1"  (1.549 m)    Body mass index is 25.51 kg/m.         Review of Systems See history of present illness. Denies chest pain, dyspnea on exertion, orthopnea, hemoptysis, claudication    Objective:   Physical Exam BP 108/74 (BP Location: Left Arm, Patient Position: Sitting, Cuff Size: Normal)   Pulse 88   Temp 98.2  F (36.8 C) (Oral)   Resp 18   Ht 5\' 1"  (1.549 m)   Wt 135 lb (61.2 kg)   LMP 11/06/2016 Comment: Tubal  SpO2 99%   BMI 25.51 kg/m     Gen.-alert and oriented x3 in no apparent distress HEENT normal for age Lungs no rhonchi or wheezing Cardiovascular regular rhythm no murmurs carotid pulses 3+ palpable no bruits audible Abdomen soft nontender no palpable masses Musculoskeletal free of  major deformities Skin clear -no rashes Neurologic normal Lower extremities 3+ femoral and dorsalis pedis pulses palpable bilaterally with no edema  I reviewed the ultrasound studies which were performed at [facility] which revealed gross reflux and a large left great saphenous vein with diameters up to 10 mm with no  reflux in the left small saphenous vein. There is no DVT.  Today I performed a bedside SonoSite ultrasound exam which revealed gross reflux and large left great saphenous vein supplying these painful varicosities     Assessment:     #1 painful varicosities left leg secondary to gross reflux left great saphenous vein-quite enlarged-causing symptoms which are affecting patient's daily living and ability to work. The symptoms are resistant to long leg elastic compression stockings 20-30 millimeter gradient which patient has worn for 18 months. Unable to elevate legs during work.CEAP3 #2 history of pulmonary embolus 7 months ago currently on Xeralto #3 cardiac arrhythmias followed by cardiology #43 episodes of superficial thrombophlebitis left leg    Plan:     Patient needs laser ablation left great saphenous vein plus greater than 20 stab phlebectomy of painful varicosities to be performed as a single procedure She has tried conservative measures over the past 18 months with no success and this is affecting her daily living and ability to work We will proceed with precertification to perform this in the near future We will then discuss with Dr. Antoine Poche stopping her anticoagulation for about 5-6 days around the procedure

## 2016-12-02 ENCOUNTER — Encounter: Payer: Self-pay | Admitting: Nurse Practitioner

## 2016-12-03 ENCOUNTER — Telehealth: Payer: Self-pay | Admitting: Cardiology

## 2016-12-03 NOTE — Telephone Encounter (Signed)
Received records from [facility] for appointment on 01/27/17 with Dr Antoine PocheHochrein.  Records put with Dr Hochrein's schedule for 01/27/17. lp

## 2016-12-05 LAB — CATECHOLAMINE+VMA, 24-HR URINE
DOPAMINE 24H UR: 70 ug/(24.h) (ref 0–510)
DOPAMINE RANDOM UR: 28 ug/L
EPINEPHRINE 24H UR: 3 ug/(24.h) (ref 0–20)
EPINEPHRINE RAND UR: 1 ug/L
NOREPINEPH RAND UR: 9 ug/L
NOREPINEPHRINE 24H UR: 23 ug/(24.h) (ref 0–135)
VMA, 24H Ur Adult: 3.5 mg/24 hr (ref 0.0–7.5)
VMA, Urine: 1.4 mg/L

## 2016-12-06 ENCOUNTER — Other Ambulatory Visit: Payer: Self-pay

## 2016-12-06 ENCOUNTER — Ambulatory Visit (HOSPITAL_COMMUNITY): Payer: BC Managed Care – PPO | Attending: Internal Medicine

## 2016-12-06 DIAGNOSIS — R002 Palpitations: Secondary | ICD-10-CM | POA: Insufficient documentation

## 2016-12-06 DIAGNOSIS — I351 Nonrheumatic aortic (valve) insufficiency: Secondary | ICD-10-CM | POA: Insufficient documentation

## 2016-12-06 DIAGNOSIS — Z86711 Personal history of pulmonary embolism: Secondary | ICD-10-CM | POA: Diagnosis not present

## 2016-12-07 ENCOUNTER — Telehealth: Payer: Self-pay | Admitting: Cardiology

## 2016-12-07 NOTE — Telephone Encounter (Signed)
New message          [facility] Pre-operative Risk Assessment    Request for surgical clearance:  1. What type of surgery is being performed? Endovenous laser ablation  2. When is this surgery scheduled? 12-15-16  3. Are there any medications that need to be held prior to surgery and how long? Hold xarelto for 5 days total (2 days prior to procedure, day of and 2 days after procedure)  4. Name of physician performing surgery? Dr Josephina GipJames Lawson  5. What is your office phone and fax number?   [phone] attn sonya and/or put note in epic.  A fax was sent to [facility] a few days ago--ok to fax completed form back  Erica SacramentoDonna M Ho 12/07/2016, 12:40 PM  _________________________________________________________________   (provider comments below)

## 2016-12-07 NOTE — Telephone Encounter (Signed)
Routed to MD for review.

## 2016-12-08 ENCOUNTER — Other Ambulatory Visit: Payer: Self-pay | Admitting: *Deleted

## 2016-12-08 DIAGNOSIS — I83892 Varicose veins of left lower extremities with other complications: Secondary | ICD-10-CM

## 2016-12-08 NOTE — Telephone Encounter (Signed)
[facility].  She wanted to inform us that patient had + D dimer a few weeks ago.  Advised that this was drawn in the ED and that a CTA was completed to r/o PE.   She verbalized understanding and was just calling to make sure we were aware.

## 2016-12-08 NOTE — Telephone Encounter (Signed)
Returned call to patient and states she was trying to speak with Dr. Graciela HusbandsKlein nurse in regards to echo study.  Advised I would relay the message to his primary nurse.  Patient verbalized understanding.

## 2016-12-08 NOTE — Telephone Encounter (Signed)
New Message ° ° pt verbalized that she is returning call for rn °

## 2016-12-08 NOTE — Telephone Encounter (Signed)
Follow up      [facility]  Nurse talked to the pt today and has more information to give you

## 2016-12-09 ENCOUNTER — Encounter: Payer: Self-pay | Admitting: *Deleted

## 2016-12-09 ENCOUNTER — Telehealth: Payer: Self-pay | Admitting: Cardiology

## 2016-12-09 NOTE — Telephone Encounter (Signed)
Returned call to patient She states Dr Candie ChromanLawson's nurse called her and they do not know how to bridge patient for surgery  Clearance letter in computer Raquel sent to Dr. Hart RochesterLawson today and called patient  Returned call patient and explained what clearance letter to Dr. Hart RochesterLawson stated. She voiced understanding.  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 Okay to hold xarelto for 2 days prior to procedure. recommend to resume ASAP after surgery. If need to hold for  >48 hours after surgery, patient needs to initiate enoxaparin therapy.  Fax sent to Dr Hart [facility]Lawson [facility](attn Lamar LaundrySonya)

## 2016-12-09 NOTE — Telephone Encounter (Signed)
New message    Pt is calling asking for a call back about her clearance.

## 2016-12-09 NOTE — Telephone Encounter (Signed)
Echo in Dr. Odessa FlemingKlein's in-basket to review.

## 2016-12-09 NOTE — Telephone Encounter (Signed)
Patient contacted and recommendations also discussed with her.

## 2016-12-09 NOTE — Telephone Encounter (Signed)
Follow up       Calling to get verbal on holding xarelto prior to surgery.  Nurse is off today and tomorrow.  She left her personal cell phone number.  Please call today and let her know the status on holding xarelto.

## 2016-12-09 NOTE — Telephone Encounter (Signed)
-----   Message from Rollene RotundaJames Hochrein, MD sent at 11/01/2016 12:53 PM EDT ----- Needs appt with Dr. Cyndie ChimeGranfortuna.   ----- Message ----- From: Levert FeinsteinGranfortuna, James M, MD Sent: 10/12/2016   9:56 AM To: Rollene RotundaJames Hochrein, MD  Yes. I have an independent benign Heme clinic 2 days a week when not on inpatient service. I talk to the residents about these kinds of cases frequently. Lowella DandyJim G ----- Message ----- From: Rollene RotundaHochrein, James, MD Sent: 10/11/2016   8:21 AM To: Levert FeinsteinJames M Granfortuna, MD  That is very helpful.  Do you still see any of your own patients.  Or alternatively, this would be a great resident case to think and talk about.  The DOACs have clearly changed the landscape on risk benefit of prolonged anticoagulation.  Jake  ----- Message ----- From: Levert FeinsteinGranfortuna, James M, MD Sent: 10/11/2016   7:57 AM To: Rollene RotundaJames Hochrein, MD  Leta JunglingJake - I agree - with an unprovoked PE, if this was documented, she should stay on the Xarelto. The testing that can be done on Xarelto includes factor V Leiden and prothormbin gene mutations, Antithrombin, antiicardiolipin antibodies, antibodies against beta-2 glycoprotein-1. What can't be done is lupus anticoagulant, Protein S or Protein C. Unless there is a strong FH of clotting, Protein S & C & antithrombin deficiencies unlikely. With a FH of Lupus, checking ACL antibodies and beta-2-GP-1 abs reasonable although they would not change management for the patient if positive. She may be someone who could decrease does of Xarelto to 10 mg for long term protection based on recent 2017 NEJM article. Hope this is helpful. Sorry for delay in reply - I was out of town last week. Lowella DandyJim G ----- Message ----- From: Rollene RotundaHochrein, James, MD Sent: 10/06/2016   4:58 PM To: Levert FeinsteinJames M Granfortuna, MD  Rosanne AshingJim,  Can you look at this note please for me.  My logic is not to stop the Xarelto.  (Don't stop it on me if I have an unprovoked PE).  [facility].  Also, what testing can we do  on Xarelto?  Thank you so much for looking at this.  Leta JunglingJake

## 2016-12-09 NOTE — Telephone Encounter (Signed)
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 Okay to hold xarelto for 2 days prior to procedure. recommend to resume ASAP after surgery. If need to hold for  >48 hours after surgery, patient needs to initiate enoxaparin therapy.  Fax sent to Dr Hart RochesterLawson [facility](attn Lamar LaundrySonya)

## 2016-12-09 NOTE — Telephone Encounter (Signed)
Spoke with Lamar LaundrySonya from Dr Hart RochesterLawson office, also relay recommendation to her..Marland Kitchen

## 2016-12-10 NOTE — Telephone Encounter (Signed)
I agree

## 2016-12-15 ENCOUNTER — Encounter: Payer: Self-pay | Admitting: Vascular Surgery

## 2016-12-15 ENCOUNTER — Ambulatory Visit (INDEPENDENT_AMBULATORY_CARE_PROVIDER_SITE_OTHER): Payer: BC Managed Care – PPO | Admitting: Vascular Surgery

## 2016-12-15 VITALS — BP 114/80 | HR 84 | Temp 97.1°F | Resp 16 | Ht 61.0 in | Wt 134.0 lb

## 2016-12-15 DIAGNOSIS — I868 Varicose veins of other specified sites: Secondary | ICD-10-CM

## 2016-12-15 DIAGNOSIS — I83892 Varicose veins of left lower extremities with other complications: Secondary | ICD-10-CM | POA: Diagnosis not present

## 2016-12-15 HISTORY — PX: ENDOVENOUS ABLATION SAPHENOUS VEIN W/ LASER: SUR449

## 2016-12-15 NOTE — Progress Notes (Signed)
Laser Ablation Procedure    Date: 12/15/2016   Erica MerrittsPamela C Ho DOB:11/30/1980  Consent signed: Yes    Surgeon:  Dr. Quita SkyeJames D. Hart RochesterLawson  Procedure: Laser Ablation: left Greater Saphenous Vein  BP 114/80 (BP Location: Left Arm, Patient Position: Sitting, Cuff Size: Normal)   Pulse 84   Temp (!) 97.1 F (36.2 C) (Oral)   Resp 16   Ht 5\' 1"  (1.549 m)   Wt 134 lb (60.8 kg)   SpO2 100%   BMI 25.32 kg/m   Tumescent Anesthesia: 500 cc 0.9% NaCl with 50 cc Lidocaine HCL with 1% Epi and 15 cc 8.4% NaHCO3  Local Anesthesia: 10 cc Lidocaine HCL and NaHCO3 (ratio 2:1)  Pulsed Mode: 15 watts, 500ms delay, 1.0 duration  Total Energy:  1703 Joules            Total Pulses: 116               Total Time: 1:54    Stab Phlebectomy: >20 Sites: Thigh, Calf and Ankle  Patient tolerated procedure well  Notes: Last dose of Xarelto taken on 12-12-2016 per patient.  Description of Procedure:  After marking the course of the secondary varicosities, the patient was placed on the operating table in the supine position, and the left leg was prepped and draped in sterile fashion.   Local anesthetic was administered and under ultrasound guidance the saphenous vein was accessed with a micro needle and guide wire; then the mirco puncture sheath was placed.  A guide wire was inserted saphenofemoral junction , followed by a 5 french sheath.  The position of the sheath and then the laser fiber below the junction was confirmed using the ultrasound.  Tumescent anesthesia was administered along the course of the saphenous vein using ultrasound guidance. The patient was placed in Trendelenburg position and protective laser glasses were placed on patient and staff, and the laser was fired at 15 watts continuous mode advancing 1-382mm/second for a total of 1703 joules.   For stab phlebectomies, local anesthetic was administered at the previously marked varicosities, and tumescent anesthesia was administered around the vessels.   Greater than 20 stab wounds were made using the tip of an 11 blade. And using the vein hook, the phlebectomies were performed using a hemostat to avulse the varicosities.  Adequate hemostasis was achieved.     Steri strips were applied to the stab wounds and ABD pads and thigh high compression stockings were applied.  Ace wrap bandages were applied over the phlebectomy sites and at the top of the saphenofemoral junction. Blood loss was less than 15 cc.  The patient ambulated out of the operating room having tolerated the procedure well.

## 2016-12-15 NOTE — Progress Notes (Signed)
Subjective:     Patient ID: Erica Ho, female   DOB: 07/10/1980, 36 y.o.   MRN: [id number]  HPI This 36 year old female had laser ablation left great saphenous vein from the distal thigh to near the saphenofemoral junction plus greater than 20 stab phlebectomy of painful varicosities performed under local tumescent anesthesia. Total of 1703 J of energy was utilized. She tolerated the procedures well.  Review of Systems     Objective:   Physical Exam BP 114/80 (BP Location: Left Arm, Patient Position: Sitting, Cuff Size: Normal)   Pulse 84   Temp (!) 97.1 F (36.2 C) (Oral)   Resp 16   Ht 5\' 1"  (1.549 m)   Wt 134 lb (60.8 kg)   SpO2 100%   BMI 25.32 kg/m        Assessment:     Well-tolerated laser ablation left great saphenous vein plus greater than 20 stab phlebectomy of painful varicosities performed under local tumescent anesthesia    Plan:     Return in 1 week for venous duplex exam to confirm closure left great saphenous vein Patient to resume Gibson RampXeralto on Friday, July 20 and discontinue ibuprofen on that day

## 2016-12-16 ENCOUNTER — Encounter: Payer: Self-pay | Admitting: Vascular Surgery

## 2016-12-16 ENCOUNTER — Encounter: Payer: Self-pay | Admitting: Nurse Practitioner

## 2016-12-20 ENCOUNTER — Encounter: Payer: Self-pay | Admitting: Vascular Surgery

## 2016-12-20 ENCOUNTER — Telehealth: Payer: Self-pay | Admitting: Cardiology

## 2016-12-20 ENCOUNTER — Encounter: Payer: Self-pay | Admitting: Oncology

## 2016-12-20 ENCOUNTER — Telehealth: Payer: Self-pay | Admitting: *Deleted

## 2016-12-20 ENCOUNTER — Ambulatory Visit (INDEPENDENT_AMBULATORY_CARE_PROVIDER_SITE_OTHER): Payer: BC Managed Care – PPO | Admitting: Oncology

## 2016-12-20 ENCOUNTER — Encounter: Payer: Self-pay | Admitting: Internal Medicine

## 2016-12-20 VITALS — BP 107/69 | HR 72 | Temp 98.0°F | Ht 61.0 in | Wt 134.7 lb

## 2016-12-20 DIAGNOSIS — Z87891 Personal history of nicotine dependence: Secondary | ICD-10-CM | POA: Diagnosis not present

## 2016-12-20 DIAGNOSIS — Z86711 Personal history of pulmonary embolism: Secondary | ICD-10-CM | POA: Diagnosis not present

## 2016-12-20 DIAGNOSIS — Z7901 Long term (current) use of anticoagulants: Secondary | ICD-10-CM | POA: Diagnosis not present

## 2016-12-20 LAB — SAVE SMEAR

## 2016-12-20 NOTE — Telephone Encounter (Signed)
New Message     She is having more episodes, wants to wear another heart monitor for longer so you can catch what is going on

## 2016-12-20 NOTE — Patient Instructions (Signed)
To lab today Return visit 4-5 weeks

## 2016-12-20 NOTE — Progress Notes (Signed)
New Patient Hematology   Erica Ho [id number] 1981-04-23 35 y.o. 12/20/2016  CC: Dr. Berton Mount, Dr. Rollene Rotunda, Ayesha Mohair nurse practitioner   Reason for referral: Evaluate for possible coagulopathy and advice on long-term anticoagulation  HPI:  Pleasant 36 year old woman who was been in overall excellent health. She has had ocular migraines since age 24. She describes these as looking into a kaleidoscope where her central vision is affected. Spells can last up to 45 minutes and as frequently as 4 times a week. Of interest, she has had no episodes since starting blood thinners back in December 2017. Although she's had intermittent palpitations in the past, these became more frequent in December 2017. She was evaluated in the emergency department on December 3. CT angiogram of the chest showed a single sub-segmental embolus in the left lower lobe. D dimer elevated at 1.25. Incidental 3 mm right lower lobe pulmonary nodule noted. She was started on full dose anticoagulation which has been continued until now. Evaluation of the lower extremity veins showed a thrombosis in a left varicose vein along the length of the vein. Follow-up studies in January 2018 showed venous insufficiency without thrombosis in the left greater saphenous vein. No DVT. No DVT on a 11/25/2016 study. A CT angiogram of the chest done 11 days after the initial study in December  on December 14 for symptoms of dizziness and palpitations showed the initial small PE had essentially resolved and there were no new emboli. A study done on 11/03/2016 to evaluate 10 minute episode of chest pain and a elevated d-dimer of 0.67 showed no acute pulmonary emboli and a stable 3 mm right lower lobe pulmonary nodule.  Although atrial fibrillation is listed on her problem list, she states that she has been told by cardiologists here and at [facility] that this was probably a computer entry error. There is no documented atrial  fibrillation on multiple cardiograms which I reviewed between December 2017 and now. She did have a 30 day cardiac monitor which showed a 9 beat run of ventricular tachycardia in addition to some PACs and PVCs. She is currently using an alive cor monitor. She is using a when necessary beta blocker when she has rapid heart rates which can go up to 180 per her history and are associated with a rise in blood pressure. She had transthoracic echocardiograms on 05/06/2016 and again recently on 12/06/2016. The most recent study also done with the bubble technique. No ASD or PFO were appreciated. Normal left and right ventricular function.mec no valve abnormalities. She had a normal stress test but I cannot find the report.  She recently had a 24-hour urine collection on 11/30/2016 to rule out pheochromocytoma. No elevated catecholamines detected on this study.  She recently underwent laser ablation of the left outer saphenous vein on 12/15/2016.  She's been pregnant 3 times. Her first pregnancy, which she carried to term, unfortunately resulted in a stillborn. 2 subsequent pregnancies without complication. She did have C-sections for both of these. Tubal ligation after the second child.  She has no other signs or symptoms that could be related to a collagen vascular disorder and specifically denies any unusual hair loss, skin rashes, polyarthralgias. One of her children, daughter age 66, is under evaluation for possible developing lupus. Her mother was diagnosed with discoid lupus currently age 34. A 42 year old daughter is healthy. One of her brothers has hereditary angioedema. Another brother and a sister are healthy. She has 2 half brothers and 2 half  sisters doesn't know anything about their health. Her father had prostate cancer. Her mother has diabetes and high blood pressure but is otherwise healthy. A maternal grandfather died of complications of stroke and coronary artery disease at age  58.      PMH: Past Medical History:  Diagnosis Date  . Asthma   . Heart palpitations    a. 05/2016: event monitor showing PAC's with one episode of 4 beats NSVT  . History of migraine headaches   . PE (pulmonary thromboembolism) (HCC)    a. 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 Varicose veins   No history of hypertension, ulcers, thyroid disease, diabetes, hepatitis, yellow jaundice, she did have mononucleosis at age 23. No history of seizure, stroke, inflammatory arthritis.  Past Surgical History:  Procedure Laterality Date  . CESAREAN SECTION    . CHOLECYSTECTOMY    . ENDOVENOUS ABLATION SAPHENOUS VEIN W/ LASER Left 12/15/2016   endovenous laser ablation L GSV and stab phlebectomy > 20 incisions left leg by Josephina Gip MD   . TONSILLECTOMY    . TUBAL LIGATION      Allergies: Allergies  Allergen Reactions  . Imitrex [Sumatriptan] Palpitations  . Cardizem [Diltiazem]     Caused blood pressure to drop  . Isovue [Iopamidol] Other (See Comments)    Couldn't focus eyes well afterwards; things looked like they had shadows  . Levofloxacin Other (See Comments)    Skin felt like it was on "fire"  . Nsaids Other (See Comments)    Cannot take because she is taking Xarelto now  . Levaquin [Levofloxacin In D5w] Palpitations    Medications:  Current Outpatient Prescriptions:  .  ferrous sulfate 325 (65 FE) MG EC tablet, Take 325 mg by mouth 3 (three) times daily with meals., Disp: , Rfl:  .  metoprolol tartrate (LOPRESSOR) 25 MG tablet, Take by mouth 3 (three) times daily. Takes 0.25mg  up to 3 times a day, Disp: , Rfl:  .  rivaroxaban (XARELTO) 20 MG TABS tablet, Take 20 mg by mouth daily with supper., Disp: , Rfl:    Social History: Married. 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 2 children. Daughter 43 with possible lupus. Daughter 15, healthy.  reports that she quit smoking about 5 years ago. She has never used smokeless tobacco.  she does  not drink alcohol or use drugs and I specifically asked about marijuana and cocaine.  Family History: See history of present illness Family History  Problem Relation Age of Onset  . Diabetes Mother   . Clotting disorder Neg Hx     Review of Systems: See history of present illness. She is having menorrhagia on the Xarelto. She has had chronic intermittent low back pain. She was told she has sciatica. MRI of the thoracic spine was normal. She gets intermittent radicular pain left T10 level. She has had unintentional weight loss over the last 6 months down from base weight of 155 to 133 pounds. Remaining ROS negative.  Physical Exam: Blood pressure 107/69, pulse 72, temperature 98 F (36.7 C), temperature source Oral, height 5\' 1"  (1.549 m), weight 134 lb 11.2 oz (61.1 kg), SpO2 100 %. Wt Readings from Last 3 Encounters:  12/20/16 134 lb 11.2 oz (61.1 kg)  12/15/16 134 lb (60.8 kg)  11/30/16 135 lb (61.2 kg)     General appearance: Pleasant, well-nourished, Caucasian woman HENNT: Pharynx no erythema, exudate, mass, or ulcer. No thyromegaly or thyroid nodules Lymph nodes: No cervical, supraclavicular, or  axillary lymphadenopathy Breasts: Lungs: Clear to auscultation, resonant to percussion throughout Heart: Regular rhythm, no murmur, no gallop, no rub, no click, no edema Abdomen: Soft, tender in the right upper quadrant, normal bowel sounds, no mass, no organomegaly Extremities: No edema, no calf tenderness. She is wearing an elastic stocking left lower extremity status post recent laser ablation surgery on July 18 and I did not remove the stocking. Musculoskeletal: no joint deformities GU:  Vascular: Carotid pulses 2+, no bruits, distal pulses: Dorsalis pedis 1+ symmetric Neurologic: Alert, oriented, PERRLA, optic discs sharp and vessels normal, no hemorrhage or exudate, cranial nerves grossly normal, motor strength 5 over 5, reflexes 1+ symmetric, upper extremities, 2+ at the right  patella, 1+ at the left patella. upper body coordination normal, gait normal, Skin: No rash or ecchymosis. No livedo reticularis.    Lab Results: Lab Results  Component Value Date   WBC 6.6 11/20/2016   HGB 13.0 11/20/2016   HCT 38.4 11/20/2016   MCV 88.1 11/20/2016   PLT 188 11/20/2016     Chemistry      Component Value Date/Time   NA 139 11/20/2016 1750   NA 139 11/01/2016 1309   K 3.6 11/20/2016 1750   CL 105 11/20/2016 1750   CO2 24 11/20/2016 1750   BUN 7 11/20/2016 1750   BUN 12 11/01/2016 1309   CREATININE 0.76 11/20/2016 1750   GLU 86 08/30/2016      Component Value Date/Time   CALCIUM 9.2 11/20/2016 1750   ALKPHOS 51 08/30/2016   AST 16 08/30/2016   ALT 11 08/30/2016     Review of peripheral blood film:   Radiological Studies: See discussion above    Impression: My clinical impression is that she does not have a "hypercoagulable state".  It is unclear that this was in fact an unprovoked pulmonary embolus. She has known venous disease. Although it would be quite uncommon to throw a clot from a superficial vein, I don't think it is impossible. The fact that this was a single, subsegmental vessel, in my mind, makes this a possibility. Data from a large [place] study published in Blood in 2015 corroborates this. In this large, population based study, there was a significant increased incidence in both DVT & PE in the first 3 months after a dx of a superficial VTE.    Current 2016 chest guidelines recommend observation alone for asymptomatic subsegmental pulmonary emboli. Her clot had already resolved within 2 weeks of her presentation. Therefore, my preliminary impression is that I would not commit her to long-term anticoagulation.  I would be more concerned with the persistent elevation of the d-dimer. Unfortunately we do not have an interim value between December 2017 and June 2018 to know whether it normalized. At time recent d-dimer was done, despite slight  elevation, there were no new thrombi either on CT angiogram or Doppler studies. We still don't know why she is having the persistent palpitations. Other than a short run of V. tach on a 30 day monitor, no malignant arrhythmias have been found. Given the persistence of her symptoms, a loop recorder may be useful. I will leave this decision to her cardiologist.  With respect to a possible hypercoagulable state, although the factor V Leiden gene mutation is not associated with early pregnancy loss, there is an increased incidence of third trimester loss so it would be reasonable to screen her for this. The same goes for screening for antiphospholipid antibodies which can cause both early and late  pregnancy loss. This being said, she had a single event with no problems during subsequent pregnancies. No signs or symptoms of a collagen vascular disorder. No strong family history of clotting. Protein S and protein C deficiency are extremely rare and almost always accompanied by a strong family history which is not present in this woman. Therefore, I do not think that we need to hold her anticoagulant until we have more information, to do screening tests for other potential coagulopathies since I wouldn't check for protein S and C otherwise.    Recommendation: Repeat d-dimer Screen for factor V Leiden, prothrombin gene mutations, anticardiolipin antibodies, antibodies to beta 2 glycoprotein 1, and antithrombin III. Continue Xarelto for now. Of note she is running into problems with menorrhagia related to the Xarelto. There are anecdotes of increased menstrual bleeding on Xarelto. If we make a decision to continue longer duration of anticoagulation, I might switch her to Eliquis.    Erica DarbyJames Alasha Mcguinness, MD, FACP  Hematology-Oncology/Internal Medicine  12/20/2016, 4:35 PM   Addendum: 12/23/16: D-dimer borderline elevated @ 0.53 Antithrombin III normal 127% No detectable anticardiolipin or anti  beta-2-glycoprotein-1 antibodies. V Leiden & Prothrombin gene mutation studies pending.  Erica DarbyJames Maclin Guerrette, MD, FACP  Hematology-Oncology/Internal Medicine

## 2016-12-20 NOTE — Telephone Encounter (Signed)
Spoke with pt she states that she is having sx of her heart racing and and back pain her HR at that time runs 150+ with these episodes. She states that she has cartia  App and when she tries to catch it, it says unclassified. She would like to have another monitor ordered to try to catch it. S/w Dr Gorden HarmsJordan-states that we have to talk to pre-cert and see what has to be re-done.

## 2016-12-20 NOTE — Telephone Encounter (Signed)
    12/20/2016  Time: 9:47 AM   Patient Name: Erica Ho  Patient of: J.D. Hart RochesterLawson, MD  Procedure:Laser Ablation left gretaer saphenous vein  and stab phlebectomy >20 incisions left leg 12-15-2016   Reached patient at home and checked  Her status  Yes    Comments/Actions Taken: [name] states when she removed compression dressing on Friday evening that she had swelling at top of left thigh, bruising, and redness along left inner thigh. She denies shortness of breath or swelling/pain of left foot or ankle.  She restarted Xarelto on 12-17-2016.  Explained that inflammation caused by sealing off of greater saphenous vein by laser can cause swelling, bruising and redness along the (left) inner thigh where treated.  [name] states she is elevating her left leg when sitting, wearing compression hose, using ice packs, and taking Tylenol prn pain (she is unable to take Ibuprofen while on Xarelto).  Confirmed her venous duplex and VV follow up appointment scheduled with Dr. Hart RochesterLawson for 12-21-2016 (tomorrow).      @SIGNATURE @

## 2016-12-21 ENCOUNTER — Ambulatory Visit (HOSPITAL_COMMUNITY)
Admission: RE | Admit: 2016-12-21 | Discharge: 2016-12-21 | Disposition: A | Discharge status: Home / Self Care | Payer: BC Managed Care – PPO | Source: Ambulatory Visit | Attending: Vascular Surgery | Admitting: Vascular Surgery

## 2016-12-21 ENCOUNTER — Telehealth: Payer: Self-pay | Admitting: Internal Medicine

## 2016-12-21 ENCOUNTER — Ambulatory Visit (INDEPENDENT_AMBULATORY_CARE_PROVIDER_SITE_OTHER): Payer: Self-pay | Admitting: Vascular Surgery

## 2016-12-21 ENCOUNTER — Encounter: Payer: Self-pay | Admitting: Vascular Surgery

## 2016-12-21 VITALS — BP 101/72 | HR 83 | Temp 97.0°F | Resp 18 | Ht 61.0 in | Wt 133.0 lb

## 2016-12-21 DIAGNOSIS — I82812 Embolism and thrombosis of superficial veins of left lower extremities: Secondary | ICD-10-CM | POA: Diagnosis not present

## 2016-12-21 DIAGNOSIS — I83892 Varicose veins of left lower extremities with other complications: Secondary | ICD-10-CM

## 2016-12-21 DIAGNOSIS — R002 Palpitations: Secondary | ICD-10-CM

## 2016-12-21 DIAGNOSIS — R000 Tachycardia, unspecified: Secondary | ICD-10-CM

## 2016-12-21 LAB — D-DIMER, QUANTITATIVE: D-DIMER: 0.53 mg/L FEU — ABNORMAL HIGH (ref 0.00–0.49)

## 2016-12-21 NOTE — Telephone Encounter (Signed)
Can order 30 day event and [facility]

## 2016-12-21 NOTE — Telephone Encounter (Signed)
Spoke with billing and they will reach out to [employer] to discuss coverage/$ for monitor. they may call pt to discuss

## 2016-12-21 NOTE — Telephone Encounter (Addendum)
Patient calling complaining of palpitations off and on for the past 7 months. Patient states that they are becoming more frequent. She states that she had 4 episodes yesterday where her HR was 140-150. She states that her BP increases with the high Hrs and that it went from 100/70 to 150/85. Patient states that she is not always able to take the metoprolol due to hypotension. She states that she does become lightheaded at times. She states that her palpitations occur at various times while she is cooking dinner, watching TV, resting, and are not associated with any type of stress. She states that she has downloaded the [employer] app on her phone but she states that she cannot always do it when she is having palpitations. She denies having any palpitations at this present time. Her current BP is 103/70 HR 83. Patient made aware that information would be forwarded to Dr. Odessa FlemingKlein's RN.

## 2016-12-21 NOTE — Progress Notes (Signed)
Subjective:     Patient ID: Erica Ho, female   DOB: 06/27/1980, 36 y.o.   MRN: [id number]  HPI This 36 year old female returns 1 week post-laser ablation left great saphenous vein. She has worn her long leg elastic compression stockings 20-30 millimeter gradient and taken ibuprofen as instructed. She resumed her Xeralto 2 days following the procedure. She currently is taking occasional Tylenol. Her pain has almost completely resolved. She noticed that the bluish discoloration and swelling in the left foot and ankle has artery improved compared to prior to the procedure. She has no complaints.  Review of Systems     Objective:   Physical Exam BP 101/72 (BP Location: Left Arm, Patient Position: Sitting, Cuff Size: Normal)   Pulse 83   Temp (!) 97 F (36.1 C) (Oral)   Resp 18   Ht 5\' 1"  (1.549 m)   Wt 133 lb (60.3 kg)   SpO2 100%   BMI 25.13 kg/m   Gen. alert and oriented 3 no apparent distress Left leg with multiple Steri-Strips in place over stab phlebectomy sites which are healing appropriately. No distal edema noted. Minimal tenderness to deep palpation over great saphenous vein.  Today I ordered a venous duplex exam the left leg which I reviewed and interpreted. There is no DVT. There is total closure of the left great saphenous vein from the proximal calf to near the saphenofemoral junction     Assessment:     Successful laser ablation left great saphenous vein plus multiple stab phlebectomy of painful varicosities with good early result and no complaints per patient    Plan:     Return to see me on a when necessary basis

## 2016-12-21 NOTE — Telephone Encounter (Signed)
Will review with Dr. Klein. 

## 2016-12-21 NOTE — Telephone Encounter (Signed)
New Message    Patient c/o Palpitations:  High priority if patient c/o lightheadedness and shortness of breath.  1. How long have you been having palpitations? 7 months Getting worse   2. Are you currently experiencing lightheadedness and shortness of breath? Lightheaded   3. Have you checked your BP and heart rate? (document readings)  Heart rate was 130 laying down last night   4. Are you experiencing any other symptoms? Thinks her bp was up ?  States she called Dr Antoine PocheHochrein office and they said to reach DR Graciela HusbandsKlein office

## 2016-12-21 NOTE — Telephone Encounter (Signed)
spoke with DOD and he states that he does not think that insurance will cover another monitor this soon, he will have to defer ordering 30 day event monitor until Dr Antoine PocheHochrein returns in 2 weeks. Notified pt of DOD concern and that [employer] will be calling to discuss billing options/coverage. She states that she has called Dr Odessa FlemingKlein's office to see if they will order/Loop recorder or if they have any suggestions. She states that she did to to [facility] yesterday and they still found nothing. Will await Dr Hochrein's return for decision on the monitor.

## 2016-12-22 NOTE — Telephone Encounter (Signed)
I called and spoke with the patient.  I have advised her that Dr. Graciela HusbandsKlein has recommended an event monitor per her continued palpitations/ tachycardia. She is agreeable with this. I also advised her I spoke with Shelly, monitor tech, and that her monitor should be covered is more than 6 months out from her last one (end date of last event was 06/10/16). The patient is aware I will go ahead and order the event monitor and that our office will be in touch with her to schedule. She voices understanding.  [facility] also [facility], in scheduling to please call to arrange follow up with Dr. Graciela HusbandsKlein in about 5-6 weeks.

## 2016-12-24 ENCOUNTER — Telehealth: Payer: Self-pay | Admitting: Internal Medicine

## 2016-12-24 MED ORDER — APIXABAN 5 MG PO TABS: 5.0000 mg | ORAL_TABLET | Freq: Two times a day (BID) | ORAL | 5 refills | Status: DC

## 2016-12-24 NOTE — Telephone Encounter (Signed)
New message    Pt is calling with a question about her xarelto.  Pt c/o medication issue:  1. Name of Medication: xarelto   2. How are you currently taking this medication (dosage and times per day)? 20 mg  3. Are you having a reaction (difficulty breathing--STAT)? no  4. What is your medication issue? She says she was off of the med for 3 days due to a procedure. She said while she was off of it she did not have the heart problems like she has been having. She ask if the medication could be causing her problem and if she can try something else.

## 2016-12-24 NOTE — Telephone Encounter (Signed)
Talked to patient.   Xarelto discontinued today. Eliquis 5mg  BID prescription sent to prefer pharmacy.  Instructions for transition given to patient as well.

## 2016-12-24 NOTE — Telephone Encounter (Signed)
Returned call to patient, reports she was off her Xarelto last week for a procedure and during that time she did not have any palpitations or "episodes".   Reports now that she is back on it, they have started again.   She is wondering if the Xarelto would have anything to do with the episodes or palpitations she is having.   Reports this is the only thing that is "different in her life"  And is requesting to change to Eliquis. She would like to try this "to eliminate this as a cause".   Advised I would route for review and further recommendations.

## 2016-12-27 LAB — CBC WITH DIFFERENTIAL/PLATELET
BASOS ABS: 0 10*3/uL (ref 0.0–0.2)
BASOS: 0 %
EOS (ABSOLUTE): 0 10*3/uL (ref 0.0–0.4)
Eos: 0 %
HEMATOCRIT: 37.2 % (ref 34.0–46.6)
HEMOGLOBIN: 12.8 g/dL (ref 11.1–15.9)
IMMATURE GRANULOCYTES: 0 %
Immature Grans (Abs): 0 10*3/uL (ref 0.0–0.1)
LYMPHS: 16 %
Lymphocytes Absolute: 1.4 10*3/uL (ref 0.7–3.1)
MCH: 30.2 pg (ref 26.6–33.0)
MCHC: 34.4 g/dL (ref 31.5–35.7)
MCV: 88 fL (ref 79–97)
MONOS ABS: 0.4 10*3/uL (ref 0.1–0.9)
Monocytes: 4 %
NEUTROS PCT: 80 %
Neutrophils Absolute: 6.9 10*3/uL (ref 1.4–7.0)
Platelets: 245 10*3/uL (ref 150–379)
RBC: 4.24 x10E6/uL (ref 3.77–5.28)
RDW: 13.3 % (ref 12.3–15.4)
WBC: 8.8 10*3/uL (ref 3.4–10.8)

## 2016-12-27 LAB — COMPREHENSIVE METABOLIC PANEL
ALBUMIN: 4.6 g/dL (ref 3.5–5.5)
ALK PHOS: 57 IU/L (ref 39–117)
ALT: 9 IU/L (ref 0–32)
AST: 18 IU/L (ref 0–40)
Albumin/Globulin Ratio: 1.6 (ref 1.2–2.2)
BUN / CREAT RATIO: 15 (ref 9–23)
BUN: 9 mg/dL (ref 6–20)
Bilirubin Total: 0.4 mg/dL (ref 0.0–1.2)
CO2: 23 mmol/L (ref 20–29)
CREATININE: 0.61 mg/dL (ref 0.57–1.00)
Calcium: 9.6 mg/dL (ref 8.7–10.2)
Chloride: 102 mmol/L (ref 96–106)
GFR calc non Af Amer: 116 mL/min/{1.73_m2} (ref >59)
GFR, EST AFRICAN AMERICAN: 134 mL/min/{1.73_m2} (ref >59)
GLOBULIN, TOTAL: 2.8 g/dL (ref 1.5–4.5)
Glucose: 101 mg/dL — ABNORMAL HIGH (ref 65–99)
Potassium: 4.6 mmol/L (ref 3.5–5.2)
SODIUM: 141 mmol/L (ref 134–144)
TOTAL PROTEIN: 7.4 g/dL (ref 6.0–8.5)

## 2016-12-27 LAB — BETA-2-GLYCOPROTEIN I ABS, IGG/M/A: Beta-2 Glyco I IgG: <9 GPI IgG units (ref 0–20)

## 2016-12-27 LAB — PROTHROMBIN GENE MUTATION

## 2016-12-27 LAB — LACTATE DEHYDROGENASE: LDH: 121 IU/L (ref 119–226)

## 2016-12-27 LAB — CARDIOLIPIN ANTIBODIES, IGG, IGM, IGA
Anticardiolipin IgG: <9 GPL U/mL (ref 0–14)
Anticardiolipin IgM: <9 MPL U/mL (ref 0–12)

## 2016-12-27 LAB — ANTITHROMBIN III: AntiThromb III Func: 127 % (ref 75–135)

## 2016-12-27 LAB — FACTOR 5 LEIDEN

## 2016-12-28 ENCOUNTER — Encounter: Payer: Self-pay | Admitting: Oncology

## 2016-12-28 ENCOUNTER — Other Ambulatory Visit: Payer: Self-pay | Admitting: Oncology

## 2016-12-28 DIAGNOSIS — I2699 Other pulmonary embolism without acute cor pulmonale: Secondary | ICD-10-CM

## 2016-12-28 NOTE — Progress Notes (Signed)
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  She has been on full dose Xarelto since that time.  She has a history of fairly advanced varicose veins in her left leg.  She had a recent laser ablation treatment to control these.  Please see my office consultation note dated December 20, 2016 for complete details of her medical history. She has been experiencing frequent uncomfortable palpitations since being on the Xarelto and has had unintentional weight loss with no decrease in appetite and thyroid functions normal 2.  She noted decreased palpitations when she stopped Xarelto for short time.  It was my clinical impression that she did not have a hypercoagulable state.  There was no family history of any blood clots.  She has no signs or symptoms of a collagen vascular disorder.  This being said, she has had a persistently elevated d-dimer.  A follow-up CT scan of the chest done in June was negative for any recurrent PEs but d-dimer was still elevated. I now have results of lab I obtained on July 23.  She tested negative for the factor V Leiden and prothrombin gene mutations, she has a normal Antithrombin level, no antibodies against beta-2 glycoprotein 1, negative anticardiolipin antibodies.  Lupus anticoagulant test cannot be done while on Xarelto. D-dimer remains borderline elevated.  At time of her PE it was 1.25, on June 5, 0.67, and on July 23, 0.53 with upper normal 0.49. Impression: No evidence for a congenital or acquired coagulopathy. I believe her small pulmonary embolus could have originated from her varicose veins. Although decreasing, d-dimer still above normal range. I called the patient with these results.  I have recommended decreasing the Xarelto down to 10 mg. She told me she was in touch with her cardiologist.  In view of the persistent palpitations which she takes may be  due to the Xarelto, she was advised to stop the Xarelto and start on Eliquis 5 mg twice daily.  I told her to go ahead and do this and we can check a d-dimer again in 2 months and make a decision at that point whether we cut her down to 2.5 mg twice daily for an additional length of time or, if the d-dimer has normalized, stop anticoagulation altogether.

## 2016-12-31 ENCOUNTER — Telehealth: Payer: Self-pay | Admitting: Internal Medicine

## 2016-12-31 ENCOUNTER — Ambulatory Visit (INDEPENDENT_AMBULATORY_CARE_PROVIDER_SITE_OTHER): Payer: BC Managed Care – PPO

## 2016-12-31 DIAGNOSIS — R002 Palpitations: Secondary | ICD-10-CM

## 2016-12-31 DIAGNOSIS — R000 Tachycardia, unspecified: Secondary | ICD-10-CM | POA: Diagnosis not present

## 2016-12-31 NOTE — Telephone Encounter (Signed)
Walk In pt Form-Ekg strips dropped off. Placed in [employer] Doc Box.

## 2017-01-03 ENCOUNTER — Telehealth: Payer: Self-pay | Admitting: *Deleted

## 2017-01-03 NOTE — Telephone Encounter (Signed)
Returning [name]'s earlier telephone message regarding painful knot on left ankle.  Erica Ho is s/p endovenous laser ablation left greater saphenous vein and stab phlebectomy >20 incisions of left leg on 12-15-2016 by Josephina GipJames Lawson MD. Erica Ho states the knot over the left ankle is over a stab phlebectomy site.  She states the knot over L ankle is "about the size of a nickel", is painful to touch and denies drainage, redness or discoloration.  Denies fever, chills.  Discussed Mrs. Garraway's symptoms and concern with Dr. Hart RochesterLawson.  He recommended moist heat to the affected area, Ibuprofen as needed for pain, elevation of left leg when sitting and to notify our office if the affected area increased in size or became more painful.  Erica Ho verbalized understanding.

## 2017-01-17 ENCOUNTER — Ambulatory Visit: Payer: BC Managed Care – PPO | Admitting: Cardiology

## 2017-01-19 ENCOUNTER — Encounter: Payer: Self-pay | Admitting: Allergy and Immunology

## 2017-01-19 ENCOUNTER — Ambulatory Visit (INDEPENDENT_AMBULATORY_CARE_PROVIDER_SITE_OTHER): Payer: BC Managed Care – PPO | Admitting: Allergy and Immunology

## 2017-01-19 VITALS — BP 108/64 | HR 68 | Temp 98.1°F | Resp 16 | Ht 61.0 in | Wt 135.0 lb

## 2017-01-19 DIAGNOSIS — R0601 Orthopnea: Secondary | ICD-10-CM | POA: Diagnosis not present

## 2017-01-19 NOTE — Progress Notes (Signed)
NEW PATIENT NOTE  Referring Provider: No ref. provider found Primary Provider: Delphia Grates, FNP Date of office visit: 01/19/2017    Subjective:   Chief Complaint:  Erica Ho (DOB: [birth year]) is a 36 y.o. female who presents to the clinic on 01/19/2017 with a chief complaint of No chief complaint on file. Marland Kitchen  HPI: Elita Quick presents to this clinic for a second opinion concerning her diagnosis of asthma.  Apparently she developed a pulmonary embolus in December 2017 believed secondary to a superficial venous thrombosis presently being treated with Xarelto. Around that point in time she started to develop problems with waking up at nighttime gasping. She would sit up in her bed and take deep breaths. She subsequently visited with Dr. Blenda Nicely who performed a sleep study which was negative and diagnosed her with asthma and gave her an albuterol inhaler and possibly a inhaled steroid. She does not have a history of exercise-induced bronchospastic symptoms and she does not have a history of cold air induced bronchospastic symptoms. She has not used her albuterol inhaler or her inhaled steroid. These issues have actually improved over time.  In addition, she has had an issue with back pain that radiates to her left side somewhat under her scapula. This does not appear to be precipitated by rotation of her torso or deep breathing. There is really no other associated systemic or constitutional symptoms with this issue. This has become less frequent over time. This apparently started about 2 months prior to her pulmonary embolus. She did see an orthopedic doctor for this issue and went to physical therapy for several weeks although this did not really make much difference. She had an MRI of her spine in evaluation of this issue which apparently was normal.  She also apparently has an issue with palpitations. She had a run of palpitations sometime in early 2017 and then has had more frequent  palpitations since her pulmonary embolus. She has been seen by her cardiologist on multiple occasions and has had 3 heart monitors and a stress test performed.  There is also an issue with her losing weight. Apparently at the time of her pulmonary embolus she was 150 pounds and she is now down the 130. She apparently has normal thyroid function tests. She has been given Klonopin over the course of the past 2 weeks and interestingly both her palpitations and her weight loss have stabilized.  Past Medical History:  Diagnosis Date  . Asthma   . Heart palpitations    a. 05/2016: event monitor showing PAC's with one episode of 4 beats NSVT  . History of migraine headaches   . PE (pulmonary thromboembolism) (HCC)    a. 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 Varicose veins     Past Surgical History:  Procedure Laterality Date  . CESAREAN SECTION    . CHOLECYSTECTOMY    . ENDOVENOUS ABLATION SAPHENOUS VEIN W/ LASER Left 12/15/2016   endovenous laser ablation L GSV and stab phlebectomy > 20 incisions left leg by Josephina Gip MD   . TONSILLECTOMY    . TUBAL LIGATION      Allergies as of 01/19/2017      Reactions   Imitrex [sumatriptan] Palpitations   Cardizem [diltiazem]    Caused blood pressure to drop   Isovue [iopamidol] Other (See Comments)   Couldn't focus eyes well afterwards; things looked like they had shadows   Levofloxacin Other (See Comments)   Skin felt like it was  on "fire"   Nsaids Other (See Comments)   Cannot take because she is taking Xarelto now   Levaquin [levofloxacin In D5w] Palpitations      Medication List      albuterol 108 (90 Base) MCG/ACT inhaler Commonly known as:  PROVENTIL HFA;VENTOLIN HFA Inhale into the lungs.   clonazePAM 0.5 MG tablet Commonly known as:  KLONOPIN   ferrous sulfate 325 (65 FE) MG EC tablet Take 325 mg by mouth 3 (three) times daily with meals.   metoprolol tartrate 25 MG tablet Commonly known as:  LOPRESSOR Take by mouth 3  (three) times daily. Takes 0.25mg  up to 3 times a day   QVAR 80 MCG/ACT inhaler Generic drug:  beclomethasone Inhale into the lungs.   XARELTO 20 MG Tabs tablet Generic drug:  rivaroxaban       Review of systems negative except as noted in HPI / PMHx or noted below:  Review of Systems  Constitutional: Negative.   HENT: Negative.   Eyes: Negative.   Respiratory: Negative.   Cardiovascular: Negative.   Gastrointestinal: Negative.   Genitourinary: Negative.   Musculoskeletal: Negative.   Skin: Negative.   Neurological: Negative.   Endo/Heme/Allergies: Negative.   Psychiatric/Behavioral: Negative.     Family History  Problem Relation Age of Onset  . Diabetes Mother   . Clotting disorder Neg Hx     Social History   Social History  . Marital status: Married    Spouse name: N/A  . Number of children: N/A  . Years of education: N/A   Occupational History  . Cashier    Social History Main Topics  . Smoking status: Former Smoker    Quit date: 08/26/2011  . Smokeless tobacco: Never Used  . Alcohol use No  . Drug use: No  . Sexual activity: Yes    Partners: Male   Other Topics Concern  . Not on file   Social History Narrative  . No narrative on file    Environmental and Social history  Lives in a house with a dry environment, a cat and dog located inside the household, carpeting in the bedroom, no plastic on the bed, no plastic on the pillow, and no smokers located inside the household. She works as a Youth worker.  Objective:   Vitals:   01/19/17 1426  BP: 108/64  Pulse: 68  Resp: 16  Temp: 98.1 F (36.7 C)   Height: 5\' 1"  (154.9 cm) Weight: 135 lb (61.2 kg)  Physical Exam  Constitutional: She is well-developed, well-nourished, and in no distress.  HENT:  Head: Normocephalic. Head is without right periorbital erythema and without left periorbital erythema.  Right Ear: Tympanic membrane, external ear and ear canal normal.  Left Ear: Tympanic  membrane, external ear and ear canal normal.  Nose: Nose normal. No mucosal edema or rhinorrhea.  Mouth/Throat: Oropharynx is clear and moist and mucous membranes are normal. No oropharyngeal exudate.  Eyes: Pupils are equal, round, and reactive to light. Conjunctivae and lids are normal.  Neck: Trachea normal. No tracheal deviation present. No thyromegaly present.  Cardiovascular: Normal rate, regular rhythm, S1 normal, S2 normal and normal heart sounds.   No murmur heard. Pulmonary/Chest: Effort normal. No stridor. No tachypnea. No respiratory distress. She has no wheezes. She has no rales. She exhibits no tenderness.  Abdominal: Soft. She exhibits no distension and no mass. There is no hepatosplenomegaly. There is no tenderness. There is no rebound and no guarding.  Musculoskeletal: She exhibits no edema  or tenderness.  Lymphadenopathy:       Head (right side): No tonsillar adenopathy present.       Head (left side): No tonsillar adenopathy present.    She has no cervical adenopathy.    She has no axillary adenopathy.  Neurological: She is alert. Gait normal.  Skin: No rash noted. She is not diaphoretic. No erythema. No pallor. Nails show no clubbing.  Psychiatric: Mood and affect normal.    Diagnostics: Allergy skin tests were not performed.   Spirometry was performed and demonstrated an FEV1 of 3.25 @ 116 % of predicted. Following the administration of nebulized levo albuterol her FEV1 did not change significantly  Results of blood tests performed 12/20/2016 identified a white blood cell count of 8.8 with a normal differential, and absolute eosinophil count of 0, and absolute lymphocyte count of 1400, hemoglobin 12.8, platelet 245.  Results of a CT angio chest performed 11/03/2016 identified the following:  Cardiovascular: Satisfactory opacification of the pulmonary arteries to the segmental level. No evidence of pulmonary embolism. Normal heart size. No pericardial effusion. No  aortic aneurysm or dissection. Normal branch pattern of the great vessels  Mediastinum/Nodes: No enlarged mediastinal, hilar, or axillary lymph nodes. Thyroid gland, trachea, and esophagus demonstrate no significant findings.  Lungs/Pleura: Stable subpleural 3 mm nodule in the right lower lobe almost certainly to be benign. No dominant mass, effusion or pneumonic consolidation. No pneumothorax.  Results of a CT angio chest performed 05/02/2016 identified the following:  Cardiovascular: There is a branching subsegmental filling defect in the medial basal segment left lower lobe consistent with acute embolism. No other embolism is noted.  Normal heart size.  No pericardial  Mediastinum/Nodes: Negative for adenopathy or mass.  Lungs/Pleura: There is no edema, consolidation, effusion, or pneumothorax. Average 3 mm pulmonary nodule in the right lower lobe.  Assessment and Plan:    1. Orthopnea    I do not think that there is any evidence that [name] has asthma at this point in time and I would not recommend that she use either a controller agent designed for asthma or a short acting bronchodilator. Certainly if her situation changes in the future she can return to this clinic for further evaluation and treatment. She does appear to have some type of musculoskeletal issue affecting the area around her left scapula but this does not appear to be increasing in intensity or frequency and in fact appears to be improving over time and the issue with her waking up at nighttime gasping also appears to be improving significantly over the course of the past several months. There may be a component of anxiety contributing to her breathing issue especially given the fact that this has improved since she has started an antianxiety medication. She is welcome to return to this clinic as needed.  Laurette Schimke, MD Allergy / Immunology [facility]

## 2017-01-26 NOTE — Progress Notes (Signed)
Cardiology Office Note   Date:  01/28/2017   ID:  Erica Ho, DOB 1980/06/08, MRN [id number]  PCP:  [facility]  Cardiologist:   Rollene Rotunda, MD  Referring:  [facility]  Chief Complaint  Patient presents with  . Palpitations      History of Present Illness: Erica Ho is a 36 y.o. female who presents for evaluation of dizziness. She has a complicated history. She was seen by [facility] cardiology.  In January of last year her heart was racing. She had 30 day event monitor was found have PACs but no symptomatic dysrhythmias. There was sinus tachycardia noted. She did have an echocardiogram and I reviewed these results and it was normal. In November she had loss of vision. She had an evaluation to include a CT which was essentially unremarkable. She was seeing neurology and had an MRI late last year. In December she had shortness of breath and some chest discomfort and had a pulmonary embolism documented. She's been on anticoagulation. She saw cardiology after this and had a question of atrial fibrillation though no documentation. She wore an event monitor. She had a superficial thrombosis but was never been found to have a DVT. She's had no clotting disorders documented.   Prior to the  last time I saw her she was apparently at [facility] and had a CT scan because of some chest discomfort but there was no evidence of recurrent PE.  She was having more discomfort and saw Theodore Demark and had a POET (Plain Old Exercise Treadmill).  This demonstrated no evidence of ischemia.  She is being seen by Dr. Cyndie Chime and remains on Eliquis.    She has seen Dr. Graciela Husbands and had an event monitor placed.  I was able to review that today for this visit.  She had about 107 pages.  There was sinus and occasional sinus tach.  However, there were no PVCs and very rare PACs.  She had many symptoms of chest pain and  skipped beats but these were with sinus rhythm.  She has since seen a neurologist in [facility] and is being sent to [facility] for some kind of autonomic dysfunction workup but she doesn't know any details of this. She's been treated with Klonipin and her symptoms improved.     Past Medical History:  Diagnosis Date  . Asthma   . Heart palpitations    a. 05/2016: event monitor showing PAC's with one episode of 4 beats NSVT  . History of migraine headaches   . PE (pulmonary thromboembolism) (HCC)    a. 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 Varicose veins     Past Surgical History:  Procedure Laterality Date  . CESAREAN SECTION    . CHOLECYSTECTOMY    . ENDOVENOUS ABLATION SAPHENOUS VEIN W/ LASER Left 12/15/2016   endovenous laser ablation L GSV and stab phlebectomy > 20 incisions left leg by Josephina Gip MD   . TONSILLECTOMY    . TUBAL LIGATION       Current Outpatient Prescriptions  Medication Sig Dispense Refill  . clonazePAM (KLONOPIN) 0.5 MG tablet     . ferrous sulfate 325 (65 FE) MG EC tablet Take 325 mg by mouth 3 (three) times daily with meals.    Carlena Hurl 20 MG TABS tablet      No current facility-administered medications for this visit.     Allergies:   Imitrex [sumatriptan]; Cardizem [  diltiazem]; Isovue [iopamidol]; Levofloxacin; Nsaids; and Levaquin [levofloxacin in d5w]    ROS:  Please see the history of present illness.   Otherwise, review of systems are positive for none.   All other systems are reviewed and negative.    PHYSICAL EXAM: VS:  BP 102/64   Pulse 74   Ht 5\' 1"  (1.549 m)   Wt 134 lb (60.8 kg)   BMI 25.32 kg/m  , BMI Body mass index is 25.32 kg/m.  GENERAL:  Well appearing NECK:  No jugular venous distention, waveform within normal limits, carotid upstroke brisk and symmetric, no bruits, no thyromegaly LUNGS:  Clear to auscultation bilaterally BACK:  No CVA tenderness CHEST:  Unremarkable HEART:  PMI not displaced or sustained,S1 and S2 within normal  limits, no S3, no S4, no clicks, no rubs, no murmurs ABD:  Flat, positive bowel sounds normal in frequency in pitch, no bruits, no rebound, no guarding, no midline pulsatile mass, no hepatomegaly, no splenomegaly EXT:  2 plus pulses throughout, no edema, no cyanosis no clubbing    EKG:  EKG is noteordered today.   Recent Labs: 08/30/2016: TSH 1.90 11/01/2016: Magnesium 2.1 12/20/2016: ALT 9; BUN 9; Creatinine, Ser 0.61; Hemoglobin 12.8; Platelets 245; Potassium 4.6; Sodium 141    Lipid Panel    Component Value Date/Time   CHOL 173 08/30/2016   TRIG 46 08/30/2016   HDL 68 08/30/2016   LDLCALC 96 08/30/2016      Wt Readings from Last 3 Encounters:  01/27/17 134 lb (60.8 kg)  01/19/17 135 lb (61.2 kg)  12/21/16 133 lb (60.3 kg)      Other studies Reviewed:  Additional studies/ records that were reviewed today include: Event monitor (104 pages) Review of the above records demonstrates:    As above.     ASSESSMENT AND PLAN:   PE:    She is on Eliquis.  She was on Xarelto because she thought the Eliquis was causing palpitations but these didn't improve so she went back to Eliquis.    She has follow up with Dr. Cyndie ChimeGranfortuna.    ARRHYTHMIA:  I don't see any dysrhythmias. She's been wearing the monitor. I've asked her to get me the note from the neurologist to see what they are [facility].  At this time she will continue the Klonopin. This seems to be the best therapy she's had so far.  She does have follow up with EP next month.    CHEST PAIN:  This is atypical and she had a negative POET (Plain Old Exercise Treadmill). No further testing is indicated.   No further testing is indicated.   Current medicines are reviewed at length with the patient today.  The patient does not have concerns regarding medicines.  The following changes have been made:  None  Labs/ tests ordered today include:   None  No orders of the defined types were placed in this  encounter.    Disposition:   FU with me in 12  months.   Signed, Rollene RotundaJames Tammatha Cobb, MD  01/28/2017 1:47 PM    [facility]

## 2017-01-27 ENCOUNTER — Ambulatory Visit (INDEPENDENT_AMBULATORY_CARE_PROVIDER_SITE_OTHER): Payer: BC Managed Care – PPO | Admitting: Cardiology

## 2017-01-27 ENCOUNTER — Encounter: Payer: Self-pay | Admitting: Cardiology

## 2017-01-27 VITALS — BP 102/64 | HR 74 | Ht 61.0 in | Wt 134.0 lb

## 2017-01-27 DIAGNOSIS — Z86711 Personal history of pulmonary embolism: Secondary | ICD-10-CM

## 2017-01-27 DIAGNOSIS — R002 Palpitations: Secondary | ICD-10-CM | POA: Diagnosis not present

## 2017-01-27 NOTE — Patient Instructions (Addendum)
Medication Instructions:  Continue current medications  If you need a refill on your cardiac medications before your next appointment, please call your pharmacy.  Labwork: None Ordered  Testing/Procedures: None Ordered  Follow-Up: Your physician wants you to follow-up in: 1 Year. You should receive a reminder letter in the mail two months in advance. If you do not receive a letter, please call our office [phone].  Your physician recommends that you schedule a follow-up appointment in: Keep appointment [facility] 02/14/2017 @ 3:20 pm    Thank you for choosing [facility] [place]!!

## 2017-01-28 ENCOUNTER — Encounter: Payer: Self-pay | Admitting: Cardiology

## 2017-02-09 ENCOUNTER — Ambulatory Visit: Payer: BC Managed Care – PPO | Admitting: Internal Medicine

## 2017-02-11 NOTE — Progress Notes (Signed)
Cardiology Office Note Date:  02/14/2017  Patient ID:  Erica, Ho 1981/05/20, MRN [id number] PCP:  [facility]  Cardiologist:  Dr. Antoine Poche Electrophysiologist: Dr. Graciela Husbands Hematology: Dr. Cyndie Chime Allergy/Asthma center: Dr. Lucie Leather Neurology: Magdalene River, PA    Chief Complaint: f/u EM  History of Present Illness: Erica Ho is a 36 y.o. female with history of c/o dizziness, palpitations without findings of cardia/arrhythmia etiology, occular migraines, PE, superficial LE thrombus, follows with oncology.  In review of Dr. Jenene Slicker note a lenthy history of symptoms, findings.  She has LE venous insufficiency with ablation of L GSV July 2018, followed with vascular surgery,   He notes: "In January of last year her heart was racing. She had 30 day event monitor was found have PACs but no symptomatic dysrhythmias. There was sinus tachycardia noted. She did have an echocardiogram and I reviewed these results and it was normal. In November she had loss of vision. She had an evaluation to include a CT which was essentially unremarkable. She was seeing neurology and had an MRI late last year. In December she had shortness of breath and some chest discomfort and had a pulmonary embolism documented. She's been on anticoagulation. She saw cardiology after this and had a question of atrial fibrillation though no documentation. She wore an event monitor. She had a superficial thrombosis but was never been found to have a DVT. She's had no clotting disorders documented.   Prior to the  last time I saw her she was apparently at [facility] and had a CT scan because of some chest discomfort but there was no evidence of recurrent PE.  She was having more discomfort and saw Theodore Demark and had a POET (Plain Old Exercise Treadmill).  This demonstrated no evidence of ischemia.  She is being seen by Dr. Cyndie Chime and remains on Eliquis."  Dr. Antoine Poche  reviewed her EM himself noted:  "There was sinus and occasional sinus tach.  However, there were no PVCs and very rare PACs.  She had many symptoms of chest pain and skipped beats but these were with sinus rhythm"  It appeared Klonipin had been as of yet the most effective treatment in reducing her symptoms.  Planned for annual cardiology f/u.  She saw Dr. Lucie Leather for 2nd opinion regarding nighttime SOB, waking with gasping SOB having suspected to be asthma, he noted that her nighttime SOB, her palpitations, and unintentional weight loss observation all improved/stabalized with Klonipin and did not think she had asthma, but suspected anxiety.  She was recommended to stop controlling and rescue inhalers.  She continues with palpitations that are "flip/flop type feelings, at times a very strong heart beat but not the racing sensation like she used to feel.  She has been tried on BB in the past though her BP did not tolerate it.  She has eliminated caffeine/stimulant sources and reports drinking water "all day long".  She does not exercise routinely but when able will walk.  Exercise does not provoke any symptoms.  When she gets the racing sensation these would make her feel lightheaded, splashing cold water on her face would resolve them.  She has never fainted.  She has been found to have + orthostatic BP dips historically.  She thinks there might be a correlation with menstrual cycle, reports heavy bleeding and may end up pursuing an endometrial ablation.  Past Medical History:  Diagnosis Date  . Asthma   . Heart palpitations  a. 05/2016: event monitor showing PAC's with one episode of 4 beats NSVT  . History of migraine headaches   . PE (pulmonary thromboembolism) (HCC)    a. 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 Varicose veins     Past Surgical History:  Procedure Laterality Date  . CESAREAN SECTION    . CHOLECYSTECTOMY    . ENDOVENOUS ABLATION SAPHENOUS VEIN W/ LASER Left 12/15/2016    endovenous laser ablation L GSV and stab phlebectomy > 20 incisions left leg by Josephina Gip MD   . TONSILLECTOMY    . TUBAL LIGATION      Current Outpatient Prescriptions  Medication Sig Dispense Refill  . clonazePAM (KLONOPIN) 0.5 MG tablet     . ferrous sulfate 325 (65 FE) MG EC tablet Take 325 mg by mouth daily with breakfast.     . XARELTO 20 MG TABS tablet      No current facility-administered medications for this visit.     Allergies:   Imitrex [sumatriptan]; Cardizem [diltiazem]; Isovue [iopamidol]; Levofloxacin; Nsaids; and Levaquin [levofloxacin in d5w]   Social History:  The patient  reports that she quit smoking about 5 years ago. She has never used smokeless tobacco. She reports that she does not drink alcohol or use drugs.   Family History:  The patient's family history includes Diabetes in her mother.  ROS:  Please see the history of present illness.  All other systems are reviewed and otherwise negative.   PHYSICAL EXAM:  VS:  BP 110/74   Pulse 74   Ht  (1.549 m)   Wt 135 lb (61.2 kg)   BMI 25.51 kg/m  BMI: Body mass index is 25.51 kg/m. Well nourished, well developed, in no acute distress  HEENT: normocephalic, atraumatic  Neck: no JVD, carotid bruits or masses Cardiac:  RRR; no significant murmurs, no rubs, or gallops Lungs:  CTA b/l, no wheezing, rhonchi or rales  Abd: soft, nontender MS: no deformity or atrophy Ext: no edema  Skin: warm and dry, no rash Neuro:  No gross deficits appreciated Psych: euthymic mood, full affect   EKG:   Not done today  Aug 2018, 30Day EM Study Highlights  Indication:palpitations Duration: s30d  Findings Symptoms Flutters sinus and occasionally PVC or PACs Chet pain assoc with sinus Shortness of breath assoc w Sinus      12/06/16: TTE/bubble study Study Conclusions - Left ventricle: The cavity size was normal. Systolic function was   normal. The estimated ejection fraction was in the range of 55%   to  60%. Wall motion was normal; there were no regional wall   motion abnormalities. - Aortic valve: There was mild to moderate regurgitation directed   centrally in the LVOT. - Atrial septum: No defect or patent foramen ovale was identified   on 2D or color Doppler. During saline contrast injection there is   delayed arrival of saline contrast in the left atrium (8 beats).   With provocative Valsalva maneuvers, there is a slight increase   in the amount of saline contrast arriving in the left atrium.   Pulmonary recirculation is most likely responsible.  11/25/16 LE venous US No DVT b/l  06/04/16: LE venous US No DVT b/l Significant venous insufficiency in  L GSV  09/16/16: EST  Blood pressure demonstrated a normal response to exercise.  There was no ST segment deviation noted during stress.  Negative exercise stress test for ischemia at an adequate heart rate of 94% MPHR  Good exercise tolerance  at 11 mets.  This is a low risk study.     Recent Labs: 08/30/2016: TSH 1.90 11/01/2016: Magnesium 2.1 12/20/2016: ALT 9; BUN 9; Creatinine, Ser 0.61; Hemoglobin 12.8; Platelets 245; Potassium 4.6; Sodium 141  08/30/2016: Cholesterol 173; HDL 68; LDL Cholesterol 96; Triglycerides 46   CrCl cannot be calculated (Patient's most recent lab result is older than the maximum 21 days allowed.).   Wt Readings from Last 3 Encounters:  02/14/17 135 lb (61.2 kg)  01/27/17 134 lb (60.8 kg)  01/19/17 135 lb (61.2 kg)     Other studies reviewed: Additional studies/records reviewed today include: summarized above  ASSESSMENT AND PLAN:  1. Palpitations     Seem to wax/wane, possibly worse prior to menses     She has been referred to [facility] autonomic clinic by her neurologist, waiting on her echo CD from here and a copy of her EKG, both provided to her today         It seems though that overall she is in general doing better     We discussed recognition of symptoms, hydration, exercise, and  elimination stimulants as much as possible, most of which she is already doing.   2. Hx of transient visual loss     She reports a known diagnosis of occular migraines     No noted findings of arrhythmia, AF or otherwise on her event monitor     No PFO or ASD by bubble study   Disposition: F/u with Dr. Graciela Husbands in 6 months, sooner if needed.  C/W Dr. Antoine Poche as directed by him.  Current medicines are reviewed at length with the patient today.  The patient did not have any concerns regarding medicines.  Norma Fredrickson, PA-C 02/14/2017 4:13 PM     [facility] [address] [place] [address] [phone] (office)  [phone] (fax)

## 2017-02-14 ENCOUNTER — Ambulatory Visit (INDEPENDENT_AMBULATORY_CARE_PROVIDER_SITE_OTHER): Payer: BC Managed Care – PPO | Admitting: Physician Assistant

## 2017-02-14 VITALS — BP 110/74 | HR 74 | Ht 61.0 in | Wt 135.0 lb

## 2017-02-14 DIAGNOSIS — I493 Ventricular premature depolarization: Secondary | ICD-10-CM

## 2017-02-14 DIAGNOSIS — R002 Palpitations: Secondary | ICD-10-CM | POA: Diagnosis not present

## 2017-02-14 DIAGNOSIS — I491 Atrial premature depolarization: Secondary | ICD-10-CM | POA: Diagnosis not present

## 2017-02-14 NOTE — Patient Instructions (Addendum)
Medication Instructions:   Your physician recommends that you continue on your current medications as directed. Please refer to the Current Medication list given to you today.   If you need a refill on your cardiac medications before your next appointment, please call your pharmacy.  Labwork:  NONE ORDERED  TODAY    Testing/Procedures: NONE ORDERED  TODAY    Follow-Up:  Your physician wants you to follow-up in:  IN  6  MONTHS WITH DR KLEIN  You will receive a reminder letter in the mail two months in advance. If you don't receive a letter, please call our office to schedule the follow-up appointment.      Any Other Special Instructions Will Be Listed Below (If Applicable).                                                                                                                                                   

## 2017-02-18 NOTE — Addendum Note (Signed)
Addended by: Bufford Spikes on: 02/18/2017 04:11 PM   Modules accepted: Orders

## 2017-02-21 ENCOUNTER — Other Ambulatory Visit (INDEPENDENT_AMBULATORY_CARE_PROVIDER_SITE_OTHER): Payer: BC Managed Care – PPO

## 2017-02-21 ENCOUNTER — Other Ambulatory Visit: Payer: Self-pay | Admitting: Oncology

## 2017-02-21 ENCOUNTER — Encounter: Payer: Self-pay | Admitting: Oncology

## 2017-02-21 ENCOUNTER — Other Ambulatory Visit: Payer: BC Managed Care – PPO

## 2017-02-21 DIAGNOSIS — I2699 Other pulmonary embolism without acute cor pulmonale: Secondary | ICD-10-CM | POA: Diagnosis not present

## 2017-02-21 LAB — D-DIMER, QUANTITATIVE: D-Dimer, Quant: 0.4 ug/mL-FEU (ref 0.00–0.50)

## 2017-02-21 LAB — CBC WITH DIFFERENTIAL/PLATELET
BASOS ABS: 0 10*3/uL (ref 0.0–0.1)
Basophils Relative: 1 %
EOS PCT: 1 %
Eosinophils Absolute: 0 10*3/uL (ref 0.0–0.7)
HCT: 35.4 % — ABNORMAL LOW (ref 36.0–46.0)
Hemoglobin: 11.7 g/dL — ABNORMAL LOW (ref 12.0–15.0)
Lymphocytes Relative: 42 %
Lymphs Abs: 2.4 10*3/uL (ref 0.7–4.0)
MCH: 29.4 pg (ref 26.0–34.0)
MCHC: 33.1 g/dL (ref 30.0–36.0)
MCV: 88.9 fL (ref 78.0–100.0)
MONO ABS: 0.3 10*3/uL (ref 0.1–1.0)
MONOS PCT: 6 %
Neutro Abs: 2.9 10*3/uL (ref 1.7–7.7)
Neutrophils Relative %: 50 %
PLATELETS: 220 10*3/uL (ref 150–400)
RBC: 3.98 MIL/uL (ref 3.87–5.11)
RDW: 12.4 % (ref 11.5–15.5)
WBC: 5.8 10*3/uL (ref 4.0–10.5)

## 2017-02-21 MED ORDER — RIVAROXABAN 10 MG PO TABS: 10.0000 mg | ORAL_TABLET | Freq: Every day | ORAL | 6 refills | Status: DC

## 2017-02-21 NOTE — Progress Notes (Signed)
Phone conversation with patient to go over lab results done today. Young woman with history of small-volume pulmonary embolus left lower lobe December 2017 maintained on full dose anticoagulation until now.  She had a persistent elevation of D-dimer.  I personally felt this was likely a provoked event given advanced varicose veins in her left leg.  She had laser treatment of the left great saphenous vein plus multiple stab phlebectomies of painful varicosities by Dr. Josephina Gip back in July.  I would therefore consider that the instigating problem has now been treated. I repeated the d-dimer today and it is now normal at 0.4. I told her I thought that she could safely stop the anticoagulation at this time.  However her cardiologist who she is seeing for atypical chest symptoms recommended staying on.  I told her a good compromise for an intermediate-low risk patient would be to reduce the dose to 50% based on mature data from 2 studies 1 using Eliquis and the other using Xarelto. She likes the idea of this compromise and I will therefore send in a electronic prescription for Xarelto 10 mg.  She was temporarily changed to Eliquis by the cardiologist.  She had difficulty taking the medication twice daily and preferred the once daily convenience of Xarelto.  There is absolutely no difference in how these drugs were or any benefit of one over the other in my opinion.

## 2017-03-31 ENCOUNTER — Telehealth: Payer: Self-pay | Admitting: Cardiology

## 2017-03-31 NOTE — Telephone Encounter (Signed)
New message    Dr. Dian Queen office- [facility] Pre-operative Risk Assessment    Request for surgical clearance:  1. What type of surgery is being performed? DNC, endometrial ablation  2. When is this surgery scheduled? Not scheduled  3. Are there any medications that need to be held prior to surgery and how long? rivaroxaban (XARELTO) 10 MG TABS tablet  4. Practice name and name of physician performing surgery? Dr. Helane Rima  5. What is your office phone and fax number? [phone],  And phone [phone]  6. Anesthesia type (None, local, MAC, general) ? Local and IV    Erica Ho 03/31/2017, 4:27 PM  _________________________________________________________________   (provider comments below)

## 2017-03-31 NOTE — Telephone Encounter (Signed)
    Chart reviewed as part of pre-operative protocol coverage. Patient was contacted 03/31/2017 in reference to pre-operative risk assessment for pending surgery as outlined below. She previously completed ETT 08/2016 which was normal. She was last seen on  02/14/17 by Francis Dowseenee Ursuy PA-C.  Since that day, Erica Ho has done well without interim changes, no change in chronic palps. No syncope. RCRI calculated at 0.4%, achieves at least 8 METS per [facility] Activity Status Index without anginal symptoms. Therefore, based on ACC/AHA guidelines, the patient would be at acceptable risk for the planned procedure without further cardiovascular testing.   Of note, her anticoagulation (Xarelto) is no longer managed through our office; she was referred to hematology to manage so if there is question regarding clearance to come off Xarelto, this should be referred to her hematologist. The patient told me on the phone she was told she would not need to come off anticoagulation and plans to contact her OB tomorrow to further discuss.  Please call if we can be of any further assistance. Will route this note via epic fax function to requesting party.  Laurann Montanaayna N Petula Rotolo, PA-C 03/31/2017, 5:16 PM

## 2017-04-05 ENCOUNTER — Encounter (INDEPENDENT_AMBULATORY_CARE_PROVIDER_SITE_OTHER): Payer: Self-pay | Admitting: Orthopaedic Surgery

## 2017-04-05 ENCOUNTER — Ambulatory Visit (INDEPENDENT_AMBULATORY_CARE_PROVIDER_SITE_OTHER): Payer: BC Managed Care – PPO | Admitting: Orthopaedic Surgery

## 2017-04-05 DIAGNOSIS — M25551 Pain in right hip: Secondary | ICD-10-CM | POA: Diagnosis not present

## 2017-04-05 DIAGNOSIS — M25511 Pain in right shoulder: Secondary | ICD-10-CM | POA: Diagnosis not present

## 2017-04-05 DIAGNOSIS — G8929 Other chronic pain: Secondary | ICD-10-CM

## 2017-04-05 MED ORDER — PREDNISONE 10 MG (21) PO TBPK: ORAL_TABLET | ORAL | 0 refills | Status: DC

## 2017-04-05 NOTE — Progress Notes (Signed)
Office Visit Note   Patient: Erica Ho           Date of Birth: 10/31/1980           MRN: [id number] Visit Date: 04/05/2017              Requested by: [facility] [address] [place], [place] [address] PCP: [facility]   Assessment & Plan: Visit Diagnoses:  1. Pain of right hip joint   2. Chronic right shoulder pain     Plan: Impression is right SI joint dysfunction and right shoulder pain.  Recommend prednisone taper for the shoulder.  Referral for SI joint injection with Dr. Alvester MorinNewton.  MRI was reviewed with the patient which  was unremarkable and unrevealing.  If the shoulder is not better with prednisone may need to consider subacromial injection.  Patient is on Xarelto for a PE that was diagnosed last year.  Follow-Up Instructions: Return if symptoms worsen or fail to improve.   Orders:  Orders Placed This Encounter  Procedures  . Ambulatory referral to Physical Medicine Rehab   Meds ordered this encounter  Medications  . predniSONE (STERAPRED UNI-PAK 21 TAB) 10 MG (21) TBPK tablet    Sig: Take as directed    Dispense:  21 tablet    Refill:  0      Procedures: No procedures performed   Clinical Data: No additional findings.   Subjective: Chief Complaint  Patient presents with  . Lower Back - Pain  . Middle Back - Pain    Patient is a 36 year old female who comes in with low back pain and radiation into her right thigh into the knee first couple months.  She has had a negative MRI of her lumbar spine and thoracic spine.  She has sharp pain at times.  She is also complaining of right shoulder pain that is worse occasionally with activity.  She endorses pain in the right lower lumbar region.  Denies any numbness and tingling.    Review of Systems  Constitutional: Negative.   HENT: Negative.   Eyes: Negative.   Respiratory: Negative.   Cardiovascular: Negative.    Endocrine: Negative.   Musculoskeletal: Negative.   Neurological: Negative.   Hematological: Negative.   Psychiatric/Behavioral: Negative.   All other systems reviewed and are negative.    Objective: Vital Signs: There were no vitals taken for this visit.  Physical Exam  Constitutional: She is oriented to person, place, and time. She appears well-developed and well-nourished.  HENT:  Head: Normocephalic and atraumatic.  Eyes: EOM are normal.  Neck: Neck supple.  Pulmonary/Chest: Effort normal.  Abdominal: Soft.  Neurological: She is alert and oriented to person, place, and time.  Skin: Skin is warm. Capillary refill takes less than 2 seconds.  Psychiatric: She has a normal mood and affect. Her behavior is normal. Judgment and thought content normal.  Nursing note and vitals reviewed.   Ortho Exam Right shoulder exam is relatively benign.  Rotator cuff is grossly intact.  Right hip exam shows painless range of motion.  Negative Faber.  Hip is nontender.  Negative Stinchfield.  Right SI joint is slightly tender. Specialty Comments:  No specialty comments available.  Imaging: No results found.   PMFS History: Patient Active Problem List   Diagnosis Date Noted  . Varicose veins of left lower extremity with complications 11/30/2016  . Family history of autoimmune disorder 06/21/2016  .  History of pulmonary embolism 05/26/2016  . Panic disorder 05/19/2016  . Anemia 05/17/2016  . History of migraine headaches 05/04/2016  . Acute pulmonary embolism (HCC) 05/03/2016  . Palpitation 05/03/2016  . Ocular migraine 04/20/2016  . Gastroesophageal reflux disease without esophagitis 04/20/2016  . Varicose veins of left lower extremity with pain 09/18/2015  . Atrial fibrillation (HCC) 06/09/2015  . Hyperthyroidism 06/09/2015  . Electrolyte disorder 06/09/2015  . Dizziness 06/09/2015  . Sinus tachycardia 06/09/2015  . Atrial flutter (HCC) 06/09/2015   Past Medical History:    Diagnosis Date  . Asthma   . Heart palpitations    a. 05/2016: event monitor showing PAC's with one episode of 4 beats NSVT  . History of migraine headaches   . PE (pulmonary thromboembolism) (HCC)    a. 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 Varicose veins     Family History  Problem Relation Age of Onset  . Diabetes Mother   . Clotting disorder Neg Hx     Past Surgical History:  Procedure Laterality Date  . CESAREAN SECTION    . CHOLECYSTECTOMY    . ENDOVENOUS ABLATION SAPHENOUS VEIN W/ LASER Left 12/15/2016   endovenous laser ablation L GSV and stab phlebectomy > 20 incisions left leg by Josephina GipJames Lawson MD   . TONSILLECTOMY    . TUBAL LIGATION     Social History   Occupational History  . Occupation: Conservation officer, [employer]  Tobacco Use  . Smoking status: Former Smoker    Last attempt to quit: 08/26/2011    Years since quitting: 5.6  . Smokeless tobacco: Never Used  Substance and Sexual Activity  . Alcohol use: No    Alcohol/week: 0.0 oz  . Drug use: No  . Sexual activity: Yes    Partners: Male

## 2017-04-12 ENCOUNTER — Encounter: Payer: Self-pay | Admitting: Oncology

## 2017-04-12 NOTE — Progress Notes (Signed)
Hematology: 36 year old woman who had low volume provoked pulmonary emboli in the left lower lobe in December 2017 likely related to a recent laser procedure for varicose veins.  She had no evidence for a congenital or acquired coagulopathy.  She was kept on full dose anticoagulation with Xarelto until December 29, 2016 when dose was decreased to 10 mg.  D-dimer normalized.  She elected to stay on the 10 mg Xarelto dose. She is scheduled for elective hypothermic endometrial ablation, D&C, and hysteroscopy on May 20, 2017. She has normal renal function. Recommendation: Stop Xarelto 24 hours prior to surgery.  Resume night of surgery if surgery is done early in the day otherwise resume the next morning if surgery done later in the day.

## 2017-04-14 ENCOUNTER — Telehealth (INDEPENDENT_AMBULATORY_CARE_PROVIDER_SITE_OTHER): Payer: Self-pay | Admitting: Orthopaedic Surgery

## 2017-04-15 ENCOUNTER — Telehealth: Payer: Self-pay | Admitting: Cardiology

## 2017-04-15 NOTE — Telephone Encounter (Signed)
New Message     Pt c/o medication issue:  1. Name of Medication: Xarelto  2. How are you currently taking this  medication (dosage and times per day)? 10 mg   3. Are you having a reaction (difficulty breathing--STAT)? Yes  4. What is your medication issue? She started having migraines again after the medication was reduced from 20mg  to 10mg 

## 2017-04-15 NOTE — Telephone Encounter (Signed)
I think it is unlikely that this is related.  Please ask her to check with her primary MD about her migraine management.

## 2017-04-18 ENCOUNTER — Ambulatory Visit (INDEPENDENT_AMBULATORY_CARE_PROVIDER_SITE_OTHER): Payer: BC Managed Care – PPO | Admitting: Physical Medicine and Rehabilitation

## 2017-04-18 NOTE — Telephone Encounter (Signed)
Left detailed message(DPR) to call PCP

## 2017-04-29 NOTE — H&P (Signed)
36 year old G 3 P 2 with history of BTL presents with menorrhagia. She had a PM in December 2017 after last treatment for varicose veins.  She is on Xarelto.  Past Medical History:  Diagnosis Date  . Asthma   . Heart palpitations    a. 05/2016: event monitor showing PAC's with one episode of 4 beats NSVT  . History of migraine headaches   . PE (pulmonary thromboembolism) (HCC)    a. 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 Varicose veins    Past Surgical History:  Procedure Laterality Date  . CESAREAN SECTION    . CHOLECYSTECTOMY    . ENDOVENOUS ABLATION SAPHENOUS VEIN W/ LASER Left 12/15/2016   endovenous laser ablation L GSV and stab phlebectomy > 20 incisions left leg by Josephina GipJames Lawson MD   . TONSILLECTOMY    . TUBAL LIGATION     Prior to Admission medications   Medication Sig Start Date End Date Taking? Authorizing Provider  clonazePAM (KLONOPIN) 0.5 MG tablet  01/03/17   [provider]  ferrous sulfate 325 (65 FE) MG EC tablet Take 325 mg by mouth daily with breakfast.     [provider]  predniSONE (STERAPRED UNI-PAK 21 TAB) 10 MG (21) TBPK tablet Take as directed 04/05/17   Tarry KosXu, Naiping M, MD  rivaroxaban (XARELTO) 10 MG TABS tablet Take 1 tablet (10 mg total) by mouth daily. 02/21/17   Levert FeinsteinGranfortuna, James M, MD   Allergies Imitrex  Afebrile VSS General alert and oriented Lung CTAB Car RRR Abdomen soft and non tender  IMPRESSION: Menorrhagia On Anticoagulation for PE  PLAN: HYS, D And C, HTA Xarelto to resume 24 hours post surgery

## 2017-05-10 ENCOUNTER — Other Ambulatory Visit: Payer: Self-pay

## 2017-05-10 ENCOUNTER — Encounter (HOSPITAL_COMMUNITY): Payer: Self-pay

## 2017-05-10 ENCOUNTER — Encounter (HOSPITAL_COMMUNITY)
Admission: RE | Admit: 2017-05-10 | Discharge: 2017-05-10 | Disposition: A | Discharge status: Home / Self Care | Payer: BC Managed Care – PPO | Source: Ambulatory Visit | Attending: Obstetrics and Gynecology | Admitting: Obstetrics and Gynecology

## 2017-05-10 DIAGNOSIS — Z01818 Encounter for other preprocedural examination: Secondary | ICD-10-CM | POA: Insufficient documentation

## 2017-05-10 DIAGNOSIS — N920 Excessive and frequent menstruation with regular cycle: Secondary | ICD-10-CM | POA: Insufficient documentation

## 2017-05-10 HISTORY — DX: Anemia, unspecified: D64.9

## 2017-05-10 HISTORY — DX: Peripheral vascular disease, unspecified: I73.9

## 2017-05-10 HISTORY — DX: Headache: R51

## 2017-05-10 HISTORY — DX: Anxiety disorder, unspecified: F41.9

## 2017-05-10 HISTORY — DX: Unspecified osteoarthritis, unspecified site: M19.90

## 2017-05-10 HISTORY — DX: Cardiac arrhythmia, unspecified: I49.9

## 2017-05-10 HISTORY — DX: Disease of blood and blood-forming organs, unspecified: D75.9

## 2017-05-10 HISTORY — DX: Headache, unspecified: R51.9

## 2017-05-10 LAB — CBC
HEMATOCRIT: 37.8 % (ref 36.0–46.0)
Hemoglobin: 12.6 g/dL (ref 12.0–15.0)
MCH: 30 pg (ref 26.0–34.0)
MCHC: 33.3 g/dL (ref 30.0–36.0)
MCV: 90 fL (ref 78.0–100.0)
Platelets: 218 10*3/uL (ref 150–400)
RBC: 4.2 MIL/uL (ref 3.87–5.11)
RDW: 12.4 % (ref 11.5–15.5)
WBC: 6.5 10*3/uL (ref 4.0–10.5)

## 2017-05-10 LAB — TYPE AND SCREEN
ABO/RH(D): A POS
Antibody Screen: NEGATIVE

## 2017-05-10 LAB — ABO/RH: ABO/RH(D): A POS

## 2017-05-10 NOTE — Patient Instructions (Addendum)
Your procedure is scheduled on:  Friday, Dec. 21  [facility] at: 6 am  Pick up the phone at the desk and dial [phone].  Call this number if you have problems the morning of surgery: [phone].  Remember: Do NOT eat food or Do NOT drink clear liquids (including water) after midnight Thursday  Take these medicines the morning of surgery with a SIP OF WATER: KLONOPIN IF NEEDED   Do not take xarelto on day of surgery.  Follow your provider instructions on when to stop xarelto prior to surgery and when to restart after surgery.    Do Not smoke on the day of surgery.  Stop herbal medications and supplements at this time.  Do NOT wear jewelry (body piercing), metal hair clips/bobby pins, make-up, or nail polish. Do NOT wear lotions, powders, or perfumes.  You may wear deoderant. Do NOT shave for 48 hours prior to surgery. Do NOT bring valuables to the hospital. Contacts, dentures, or bridgework may not be worn into surgery.  Have a responsible adult drive you home and stay with you for 24 hours after your procedure.

## 2017-05-10 NOTE — Pre-Procedure Instructions (Addendum)
Patient seen by Dr. Cyndie ChimeGranfortuna prior to this appointment and okay' d for surgery. See note in chart

## 2017-05-20 ENCOUNTER — Encounter (HOSPITAL_COMMUNITY): Admission: RE | Payer: Self-pay | Source: Ambulatory Visit

## 2017-05-20 ENCOUNTER — Ambulatory Visit (HOSPITAL_COMMUNITY)
Admission: RE | Admit: 2017-05-20 | Payer: BC Managed Care – PPO | Source: Ambulatory Visit | Admitting: Obstetrics and Gynecology

## 2017-05-20 SURGERY — DILATATION & CURETTAGE/HYSTEROSCOPY WITH HYDROTHERMAL ABLATION
Anesthesia: Choice

## 2017-05-25 ENCOUNTER — Ambulatory Visit: Payer: BC Managed Care – PPO | Admitting: Adult Health

## 2017-08-19 ENCOUNTER — Ambulatory Visit: Payer: BC Managed Care – PPO | Admitting: Internal Medicine

## 2017-08-23 ENCOUNTER — Encounter: Payer: Self-pay | Admitting: Internal Medicine

## 2017-08-23 ENCOUNTER — Ambulatory Visit: Payer: BC Managed Care – PPO | Admitting: Internal Medicine

## 2017-08-23 VITALS — BP 110/70 | HR 72 | Ht 61.0 in | Wt 138.0 lb

## 2017-08-23 DIAGNOSIS — I493 Ventricular premature depolarization: Secondary | ICD-10-CM

## 2017-08-23 DIAGNOSIS — R002 Palpitations: Secondary | ICD-10-CM

## 2017-08-23 DIAGNOSIS — I491 Atrial premature depolarization: Secondary | ICD-10-CM

## 2017-08-23 NOTE — Progress Notes (Signed)
appt made in error

## 2017-08-26 NOTE — Addendum Note (Signed)
Addended by: Dareen PianoKENAN, Storm Dulski C on: 08/26/2017 07:30 AM   Modules accepted: Orders

## 2017-09-09 ENCOUNTER — Encounter: Payer: Self-pay | Admitting: Cardiology

## 2017-09-16 ENCOUNTER — Telehealth: Payer: Self-pay | Admitting: Internal Medicine

## 2017-09-16 NOTE — Telephone Encounter (Signed)
New Message    Patient is calling to discuss an appointment that she had with [facility]. She states that the doctor recommend a loop recorder and she wants to discuss. Please call to discuss.

## 2017-09-19 NOTE — Telephone Encounter (Signed)
Response sent via MyChart message.

## 2017-09-21 ENCOUNTER — Telehealth: Payer: Self-pay | Admitting: Internal Medicine

## 2017-09-21 NOTE — Telephone Encounter (Signed)
Pt is calling and have questions concerning the loop recorder. Please call pt.

## 2017-09-21 NOTE — Telephone Encounter (Signed)
Spoke with pt and verified her date and time of linq implant of Friday May 3. Letter has been sent via MyChart.

## 2017-09-24 ENCOUNTER — Other Ambulatory Visit: Payer: Self-pay | Admitting: Oncology

## 2017-09-30 ENCOUNTER — Encounter (HOSPITAL_COMMUNITY): Payer: Self-pay | Admitting: Internal Medicine

## 2017-09-30 ENCOUNTER — Ambulatory Visit (HOSPITAL_COMMUNITY)
Admission: RE | Admit: 2017-09-30 | Discharge: 2017-09-30 | Disposition: A | Discharge status: Home / Self Care | Payer: BC Managed Care – PPO | Source: Ambulatory Visit | Attending: Internal Medicine | Admitting: Internal Medicine

## 2017-09-30 ENCOUNTER — Encounter (HOSPITAL_COMMUNITY)
Admission: RE | Disposition: A | Discharge status: Home / Self Care | Payer: Self-pay | Source: Ambulatory Visit | Attending: Internal Medicine

## 2017-09-30 DIAGNOSIS — Z79899 Other long term (current) drug therapy: Secondary | ICD-10-CM | POA: Insufficient documentation

## 2017-09-30 DIAGNOSIS — Z86711 Personal history of pulmonary embolism: Secondary | ICD-10-CM | POA: Insufficient documentation

## 2017-09-30 DIAGNOSIS — Z7901 Long term (current) use of anticoagulants: Secondary | ICD-10-CM | POA: Diagnosis not present

## 2017-09-30 DIAGNOSIS — Z9889 Other specified postprocedural states: Secondary | ICD-10-CM | POA: Insufficient documentation

## 2017-09-30 DIAGNOSIS — R002 Palpitations: Secondary | ICD-10-CM

## 2017-09-30 DIAGNOSIS — Z9851 Tubal ligation status: Secondary | ICD-10-CM | POA: Diagnosis not present

## 2017-09-30 DIAGNOSIS — G43109 Migraine with aura, not intractable, without status migrainosus: Secondary | ICD-10-CM | POA: Insufficient documentation

## 2017-09-30 DIAGNOSIS — Z881 Allergy status to other antibiotic agents status: Secondary | ICD-10-CM | POA: Insufficient documentation

## 2017-09-30 DIAGNOSIS — Z87891 Personal history of nicotine dependence: Secondary | ICD-10-CM | POA: Insufficient documentation

## 2017-09-30 DIAGNOSIS — G901 Familial dysautonomia [Riley-Day]: Secondary | ICD-10-CM | POA: Diagnosis not present

## 2017-09-30 DIAGNOSIS — Z888 Allergy status to other drugs, medicaments and biological substances status: Secondary | ICD-10-CM | POA: Insufficient documentation

## 2017-09-30 DIAGNOSIS — F419 Anxiety disorder, unspecified: Secondary | ICD-10-CM | POA: Insufficient documentation

## 2017-09-30 DIAGNOSIS — Z886 Allergy status to analgesic agent status: Secondary | ICD-10-CM | POA: Insufficient documentation

## 2017-09-30 DIAGNOSIS — I8390 Asymptomatic varicose veins of unspecified lower extremity: Secondary | ICD-10-CM | POA: Diagnosis not present

## 2017-09-30 DIAGNOSIS — M4696 Unspecified inflammatory spondylopathy, lumbar region: Secondary | ICD-10-CM | POA: Insufficient documentation

## 2017-09-30 DIAGNOSIS — Z9049 Acquired absence of other specified parts of digestive tract: Secondary | ICD-10-CM | POA: Insufficient documentation

## 2017-09-30 HISTORY — PX: LOOP RECORDER INSERTION: EP1214

## 2017-09-30 SURGERY — LOOP RECORDER INSERTION

## 2017-09-30 MED ORDER — LIDOCAINE-EPINEPHRINE 1 %-1:100000 IJ SOLN
INTRAMUSCULAR | Status: DC | PRN
  Administered 2017-09-30: 20 mL

## 2017-09-30 MED ORDER — LIDOCAINE-EPINEPHRINE 1 %-1:100000 IJ SOLN
INTRAMUSCULAR | Status: AC
  Filled 2017-09-30: qty 1

## 2017-09-30 SURGICAL SUPPLY — 2 items
LOOP REVEAL LINQSYS (Prosthesis & Implant Heart) ×2 IMPLANT
PACK LOOP INSERTION (CUSTOM PROCEDURE TRAY) ×2 IMPLANT

## 2017-09-30 NOTE — Discharge Instructions (Signed)
°**  IMPLANTABLE LOOP RECORDER INSTRUCTIONS GIVEN**

## 2017-09-30 NOTE — H&P (Signed)
Patient Care Team: [facility] as PCP - General Rollene Rotunda, MD as Consulting Physician (Cardiology) Marcelle Overlie, MD as Consulting Physician (Obstetrics and Gynecology)   HPI  Erica Ho is a 37 y.o. female Seen for [facility] implant for palpitations-- had been recently seen at [facility] and this was their recommendations  Their report was reviewed byt not available yet in Epic or  .In [facility]       Records and Results Reviewed  Past Medical History:  Diagnosis Date  . ABLATION SUPERFICAL VIENS LEFT LEG 11/2016  . Anemia    RESOLVED  . Anxiety   . Arthritis    LOWER BACK  . Blood dyscrasia   . Dysrhythmia    PVC, PAC, SYMPTOMATIC HOLTER MONITOR TWICE AND NOTHING SHOW UP  . Headache    OCULAR MIGRAINES  . Heart palpitations    a. 05/2016: event monitor showing PAC's with one episode of 4 beats NSVT  . History of migraine headaches   . PE (pulmonary thromboembolism) (HCC)    a. 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 Varicose veins     Past Surgical History:  Procedure Laterality Date  . CESAREAN SECTION    . CHOLECYSTECTOMY    . ENDOVENOUS ABLATION SAPHENOUS VEIN W/ LASER Left 12/15/2016   endovenous laser ablation L GSV and stab phlebectomy > 20 incisions left leg by Josephina Gip MD   . TONSILLECTOMY    . TUBAL LIGATION      No current facility-administered medications for this encounter.     Allergies  Allergen Reactions  . Imitrex [Sumatriptan] Palpitations  . Cardizem [Diltiazem] Other (See Comments)    Caused blood pressure to drop  . Isovue [Iopamidol] Other (See Comments)    Couldn't focus eyes well afterwards; things looked like they had shadows  . Levofloxacin Other (See Comments)    Skin felt like it was on "fire"  . Nsaids Other (See Comments)    Cannot take because she is taking Xarelto now      Social History   Tobacco Use  . Smoking status: Former Smoker    Last attempt to  quit: 08/26/2011    Years since quitting: 6.1  . Smokeless tobacco: Never Used  Substance Use Topics  . Alcohol use: No    Alcohol/week: 0.0 oz  . Drug use: No     Family History  Problem Relation Age of Onset  . Diabetes Mother   . Clotting disorder Neg Hx      Current Meds  Medication Sig  . acetaminophen (TYLENOL) 500 MG tablet Take 500-1,000 mg by mouth daily as needed for moderate pain or headache.   . clonazePAM (KLONOPIN) 0.5 MG tablet Take 0.5 mg by mouth 2 (two) times daily.   . ferrous sulfate 325 (65 FE) MG EC tablet Take 325 mg by mouth daily with breakfast.   . XARELTO 10 MG TABS tablet TAKE 1 TABLET BY MOUTH EVERY DAY (Patient taking differently: TAKE 1 TABLET BY MOUTH EVERY DAY IN THE EVENING)     Review of Systems negative except from HPI and PMH  Physical Exam BP 110/80   Pulse 78   Temp 97.7 F (36.5 C) (Oral)   Resp 18   Ht  (1.549 m)   Wt 138 lb (62.6 kg)   SpO2 100%   BMI 26.07 kg/m  Well developed and nourished in no acute distress HENT normal Neck supple with JVP-flat Clear  Regular rate and rhythm, no murmurs or gallops Abd-soft with active BS No Clubbing cyanosis edema Skin-warm and dry A & Oriented  Grossly normal sensory and motor function      Assessment and  Plan  Dysautonomia  Tachypalpitations  PE  ? Ocular migraine     Pt seen at [facility] and they have requested insertion of loop recorder

## 2017-10-12 ENCOUNTER — Ambulatory Visit (INDEPENDENT_AMBULATORY_CARE_PROVIDER_SITE_OTHER): Payer: Self-pay | Admitting: *Deleted

## 2017-10-12 DIAGNOSIS — R002 Palpitations: Secondary | ICD-10-CM

## 2017-10-12 LAB — CUP PACEART INCLINIC DEVICE CHECK
Implantable Pulse Generator Implant Date: 20190503
MDC IDC SESS DTM: 20190515163502

## 2017-10-12 NOTE — Progress Notes (Signed)
Wound Loop check in clinic. Steri-strips removed prior to OV, incision edges approximated wound well healed. Battery status: Good. R-waves 0.39mV. 2 symptom episodes, (1) from implant May 14 symptom episode showed PVCs and undersensing of R-waves.  0 tachy episodes, 0 pause episodes, 0 brady episodes. 0 AF episodes . Monthly summary reports and ROV with SK PRN. Pt educated about symptom activator and to call device clinic when she presses activator to describe her symptom.

## 2017-10-17 ENCOUNTER — Telehealth: Payer: Self-pay | Admitting: *Deleted

## 2017-10-17 NOTE — Telephone Encounter (Signed)
Spoke with patient.  Advised that transmission reveals SR w/ occasional PACs and rare PVCs.  HR with episode on 5/19 went from ~75bpm to ~95bpm, which patient correlated with "racing."  Advised that I will review episodes with Dr. Graciela Husbands and call her back if any changes are recommended.  Will also clarify f/u schedule.  Patient is agreeable to this plan and denies additional questions or concerns at this time.

## 2017-10-17 NOTE — Telephone Encounter (Signed)
Erica Ho calling d/t symptom episodes over the weekend. She reports that she felt palpitations this weekend and used her symptom activator 3 times this weekend. The worst episode was yesterday between 2-3pm, she felt palpitations and then her heart raced. I let her know that we would review her nightly transmission and call her back. She verbalizes understanding.

## 2017-10-28 NOTE — Telephone Encounter (Signed)
Reviewed "symptom" episodes with Dr. Graciela HusbandsKlein.  He agreed that ECGs indicate SR w/ occasional PACs and rare PVCs.  No changes recommended at this time.  Plan to follow-up in office PRN, continue remote monitoring.  LMOVM (DPR) for patient advising of no changes and f/u plan.  Message routed to Dr. Antoine PocheHochrein as FYI per Dr. Odessa FlemingKlein's recommendation.

## 2017-11-01 ENCOUNTER — Other Ambulatory Visit: Payer: Self-pay | Admitting: Internal Medicine

## 2017-11-02 ENCOUNTER — Ambulatory Visit (INDEPENDENT_AMBULATORY_CARE_PROVIDER_SITE_OTHER): Payer: BC Managed Care – PPO | Admitting: *Deleted

## 2017-11-02 DIAGNOSIS — R002 Palpitations: Secondary | ICD-10-CM | POA: Diagnosis not present

## 2017-11-02 NOTE — Progress Notes (Signed)
Carelink Summary Report / Loop Recorder 

## 2017-11-03 ENCOUNTER — Telehealth: Payer: Self-pay | Admitting: *Deleted

## 2017-11-03 NOTE — Telephone Encounter (Signed)
Patient returning your call. Informed pt that nurse was calling to have pt send a manual transmission w/ her home monitor. Pt stated that she would send the transmission now.

## 2017-11-03 NOTE — Telephone Encounter (Signed)
Manual transmission received.  ECGs appear SR, rare PACs and PVCs noted.  ECGs printed and placed in Dr. Phyllis GingerKlein's LINQ folder for review.

## 2017-11-03 NOTE — Telephone Encounter (Signed)
Attempted to reach patient to request manual transmission for review of 2 "symptom" episodes that did not transmit automatically.  Available ECG shows ST w/PVCs.  Unable to LMOVM due to "poor connection" per auto-recorded message on VM.

## 2017-11-21 ENCOUNTER — Telehealth: Payer: Self-pay

## 2017-11-21 NOTE — Telephone Encounter (Signed)
Pt called she lost her symptom activator. I told her we will mail her one.

## 2017-12-02 ENCOUNTER — Ambulatory Visit (INDEPENDENT_AMBULATORY_CARE_PROVIDER_SITE_OTHER): Payer: BC Managed Care – PPO | Admitting: *Deleted

## 2017-12-02 DIAGNOSIS — R002 Palpitations: Secondary | ICD-10-CM | POA: Diagnosis not present

## 2017-12-05 NOTE — Progress Notes (Signed)
Carelink Summary Report / Loop Recorder 

## 2017-12-08 LAB — CUP PACEART REMOTE DEVICE CHECK
Date Time Interrogation Session: 20190605113730
Implantable Pulse Generator Implant Date: 20190503

## 2017-12-12 ENCOUNTER — Encounter: Payer: BC Managed Care – PPO | Admitting: Oncology

## 2017-12-19 ENCOUNTER — Other Ambulatory Visit: Payer: Self-pay

## 2017-12-19 ENCOUNTER — Encounter: Payer: Self-pay | Admitting: Oncology

## 2017-12-19 ENCOUNTER — Ambulatory Visit (INDEPENDENT_AMBULATORY_CARE_PROVIDER_SITE_OTHER): Payer: BC Managed Care – PPO | Admitting: Oncology

## 2017-12-19 VITALS — BP 105/67 | HR 70 | Temp 98.2°F | Ht 61.0 in | Wt 145.0 lb

## 2017-12-19 DIAGNOSIS — Z86711 Personal history of pulmonary embolism: Secondary | ICD-10-CM | POA: Diagnosis not present

## 2017-12-19 DIAGNOSIS — Z7901 Long term (current) use of anticoagulants: Secondary | ICD-10-CM | POA: Diagnosis not present

## 2017-12-19 DIAGNOSIS — N920 Excessive and frequent menstruation with regular cycle: Secondary | ICD-10-CM | POA: Diagnosis not present

## 2017-12-19 DIAGNOSIS — Z8679 Personal history of other diseases of the circulatory system: Secondary | ICD-10-CM

## 2017-12-19 DIAGNOSIS — G43109 Migraine with aura, not intractable, without status migrainosus: Secondary | ICD-10-CM

## 2017-12-19 DIAGNOSIS — Z881 Allergy status to other antibiotic agents status: Secondary | ICD-10-CM

## 2017-12-19 DIAGNOSIS — R002 Palpitations: Secondary | ICD-10-CM

## 2017-12-19 DIAGNOSIS — Z888 Allergy status to other drugs, medicaments and biological substances status: Secondary | ICD-10-CM

## 2017-12-19 DIAGNOSIS — Z886 Allergy status to analgesic agent status: Secondary | ICD-10-CM

## 2017-12-19 NOTE — Progress Notes (Signed)
Hematology and Oncology Follow Up Visit  Erica Ho [id number] [birth year] 37 y.o. 12/19/2017 10:01 AM   Principle Diagnosis: Encounter Diagnoses  Name Primary?  . History of pulmonary embolism Yes  . Chronic anticoagulation   Clinical summary: 37 year old woman with history of small-volume pulmonary embolus left lower lobe December 2017 maintained on full dose anticoagulation until September 2018 when I reduced the dose of Xarelto from 20 mg to 10 mg daily since I felt she had passed the period off maximum risk, and her d-dimer had normalized.  She had an initial persistent elevation of D-dimer.   I  felt her pulmonary embolus was likely a provoked event given advanced varicose veins in her left leg.  She had laser treatment of the left great saphenous vein plus multiple stab phlebectomies of painful varicosities by Dr. Josephina Gip back in July 2018.  I would therefore consider that the instigating problem has now been treated. I told her I thought that she could safely stop the anticoagulation   however her cardiologist, who she was seeing for atypical chest symptoms, recommended staying on.  I told her a good compromise for an intermediate-low risk patient would be to reduce the dose to 50% based on mature data from 2 studies 1 using Eliquis and the other using Xarelto. She liked the idea of this compromise.  She was temporarily changed to Eliquis by the cardiologist.  She had difficulty taking the medication twice daily and preferred the once daily convenience of Xarelto.  There is absolutely no difference in how these drugs work or any benefit of one over the other in my opinion. She has been stable on the reduced dose and has had no further pulmonary symptoms. She continues to have intermittent palpitations.  She has now seen a cardiologist in [place], [place], and [place].  She saw her a autonomic insufficiency expert [facility] in April 2019.  No diagnosis of autonomic  insufficiency was established.  Recommendation was for placement of a loop recorder which was done on Oct 12, 2017 [place] Dr. [place]ac[place]   Interim History:    From the coagulation point of view, she has had no recurrent respiratory symptoms. At time of her visit with me last July, she had a planned endometrial ablation to decrease the volume of her menstrual cycle.  This was all set up but then she changed her mind when she did some reading and found out it was probably not a good procedure to do in someone who had had previous C-sections.  She had C-sections with both of her children.  Her girls are now 61 and 48 years old. She continued to have follow-up for her intermittent palpitations.  No major arrhythmias determined requiring treatment.  As noted above, recent evaluation at [facility] to look for autonomic insufficiency.  Recent placement of a loop recorder here in [place] in May. Following the decrease in her dose of Xarelto from 20 to 10 mg daily, her menstrual cycles have slowed down and she is no longer experiencing heavy bleeding. She still gets occasional ocular migraine described as a kaleidoscope change in her vision in both eyes usually lasting for a few minutes.  These have decreased in frequency over time to about once every 2 months.   Medications: reviewed  Allergies:  Allergies  Allergen Reactions  . Imitrex [Sumatriptan] Palpitations  . Cardizem [Diltiazem] Other (See Comments)    Caused blood pressure to drop  . Isovue [Iopamidol] Other (  See Comments)    Couldn't focus eyes well afterwards; things looked like they had shadows  . Levofloxacin Other (See Comments)    Skin felt like it was on "fire"  . Nsaids Other (See Comments)    Cannot take because she is taking Xarelto now    Review of Systems: See interim history Remaining ROS negative:   Physical Exam: Blood pressure 105/67, pulse 70, temperature 98.2 F (36.8 C),  temperature source Oral, height 5\' 1"  (1.549 m), weight 145 lb (65.8 kg), SpO2 100 %. Wt Readings from Last 3 Encounters:  12/19/17 145 lb (65.8 kg)  09/30/17 138 lb (62.6 kg)  08/23/17 138 lb (62.6 kg)     General appearance: Well-nourished Caucasian woman HENNT: Pharynx no erythema, exudate, mass, or ulcer. No thyromegaly or thyroid nodules Lymph nodes: No cervical, supraclavicular, or axillary lymphadenopathy Breasts:  Lungs: Clear to auscultation, resonant to percussion throughout Heart: Regular rhythm, no murmur, no gallop, no rub, no click, no edema Abdomen: Soft, nontender, normal bowel sounds, no mass, no organomegaly Extremities: No edema, no calf tenderness Musculoskeletal: no joint deformities GU:  Vascular: Carotid pulses 2+, no bruits, distal pulses:  Neurologic: Alert, oriented, PERRLA, optic discs sharp and vessels normal, no hemorrhage or exudate, cranial nerves grossly normal, motor strength 5 over 5, reflexes 1+ symmetric, upper body coordination normal, gait normal, Skin: No rash or ecchymosis.  She has a number of tattoos.  Lab Results: CBC W/Diff    Component Value Date/Time   WBC 6.5 05/10/2017 1430   RBC 4.20 05/10/2017 1430   HGB 12.6 05/10/2017 1430   HGB 12.8 12/20/2016 1548   HCT 37.8 05/10/2017 1430   HCT 37.2 12/20/2016 1548   PLT 218 05/10/2017 1430   PLT 245 12/20/2016 1548   MCV 90.0 05/10/2017 1430   MCV 88 12/20/2016 1548   MCH 30.0 05/10/2017 1430   MCHC 33.3 05/10/2017 1430   RDW 12.4 05/10/2017 1430   RDW 13.3 12/20/2016 1548   LYMPHSABS 2.4 02/21/2017 1545   LYMPHSABS 1.4 12/20/2016 1548   MONOABS 0.3 02/21/2017 1545   EOSABS 0.0 02/21/2017 1545   EOSABS 0.0 12/20/2016 1548   BASOSABS 0.0 02/21/2017 1545   BASOSABS 0.0 12/20/2016 1548     Chemistry      Component Value Date/Time   NA 141 12/20/2016 1548   K 4.6 12/20/2016 1548   CL 102 12/20/2016 1548   CO2 23 12/20/2016 1548   BUN 9 12/20/2016 1548   CREATININE 0.61  12/20/2016 1548   GLU 86 08/30/2016      Component Value Date/Time   CALCIUM 9.6 12/20/2016 1548   ALKPHOS 57 12/20/2016 1548   AST 18 12/20/2016 1548   ALT 9 12/20/2016 1548   BILITOT 0.4 12/20/2016 1548       Radiological Studies: No results found.  Impression:  1.  Low volume single sub-segmental left lower lobe pulmonary embolus occurring in December 2017 of provoked versus unprovoked.  See discussion above. Currently stable on dose reduced Xarelto. Plan: Continue the same with annual reevaluation. I will follow her through March of next year then transition her care to another hematologist, Dr.Kale.  2.  Menorrhagia Partially related to full dose Xarelto now improved with reduced dose  3.  Intermittent palpitations with no major pathology found to date and not currently on an antiarrhythmic.  Continued follow-up with cardiology.  4.  Ocular migraine  CC: Patient Care Team: [facility] as PCP - General  Rollene RotundaHochrein, Kitti Mcclish, MD as Consulting Physician (Cardiology) Marcelle OverlieGrewal, Michelle, MD as Consulting Physician (Obstetrics and Gynecology)   Cephas DarbyJames Lavin Petteway, MD, FACP  Hematology-Oncology/Internal Medicine     7/22/201910:01 AM

## 2017-12-19 NOTE — Patient Instructions (Signed)
To lab today Return visit as needed  Please schedule new patient [facility] for anytime after 1/120 Hx of unprovoked pulmonary embolism on chronic anticoagulation with Xarelto

## 2017-12-20 LAB — COMPREHENSIVE METABOLIC PANEL
A/G RATIO: 1.7 (ref 1.2–2.2)
ALBUMIN: 4.5 g/dL (ref 3.5–5.5)
ALK PHOS: 36 IU/L — AB (ref 39–117)
ALT: 11 IU/L (ref 0–32)
AST: 15 IU/L (ref 0–40)
BILIRUBIN TOTAL: 0.5 mg/dL (ref 0.0–1.2)
BUN / CREAT RATIO: 22 (ref 9–23)
BUN: 17 mg/dL (ref 6–20)
CHLORIDE: 99 mmol/L (ref 96–106)
CO2: 23 mmol/L (ref 20–29)
Calcium: 9.3 mg/dL (ref 8.7–10.2)
Creatinine, Ser: 0.78 mg/dL (ref 0.57–1.00)
GFR calc Af Amer: 112 mL/min/{1.73_m2} (ref >59)
GFR calc non Af Amer: 97 mL/min/{1.73_m2} (ref >59)
GLOBULIN, TOTAL: 2.6 g/dL (ref 1.5–4.5)
GLUCOSE: 82 mg/dL (ref 65–99)
POTASSIUM: 4.4 mmol/L (ref 3.5–5.2)
SODIUM: 138 mmol/L (ref 134–144)
Total Protein: 7.1 g/dL (ref 6.0–8.5)

## 2017-12-20 LAB — CBC WITH DIFFERENTIAL/PLATELET
Basophils Absolute: 0 10*3/uL (ref 0.0–0.2)
Basos: 0 %
EOS (ABSOLUTE): 0 10*3/uL (ref 0.0–0.4)
Eos: 0 %
HEMATOCRIT: 40.9 % (ref 34.0–46.6)
Hemoglobin: 13.8 g/dL (ref 11.1–15.9)
IMMATURE GRANULOCYTES: 0 %
Immature Grans (Abs): 0 10*3/uL (ref 0.0–0.1)
LYMPHS ABS: 1.7 10*3/uL (ref 0.7–3.1)
Lymphs: 36 %
MCH: 30.6 pg (ref 26.6–33.0)
MCHC: 33.7 g/dL (ref 31.5–35.7)
MCV: 91 fL (ref 79–97)
Monocytes Absolute: 0.3 10*3/uL (ref 0.1–0.9)
Monocytes: 7 %
NEUTROS PCT: 57 %
Neutrophils Absolute: 2.6 10*3/uL (ref 1.4–7.0)
PLATELETS: 247 10*3/uL (ref 150–450)
RBC: 4.51 x10E6/uL (ref 3.77–5.28)
RDW: 13.5 % (ref 12.3–15.4)
WBC: 4.6 10*3/uL (ref 3.4–10.8)

## 2017-12-21 ENCOUNTER — Telehealth: Payer: Self-pay | Admitting: *Deleted

## 2017-12-21 NOTE — Telephone Encounter (Signed)
-----   Message from Levert FeinsteinJames M Granfortuna, MD sent at 12/20/2017 10:21 AM EDT ----- Call pt: labs all normal. No longer anemic

## 2017-12-21 NOTE — Telephone Encounter (Signed)
Pt informed "labs all normal. No longer anemic" per Dr Cyndie ChimeGranfortuna. Stated thank you for calling.

## 2017-12-21 NOTE — Telephone Encounter (Signed)
Called pt - no answer; left message to give me a call back. 

## 2018-01-05 ENCOUNTER — Telehealth: Payer: Self-pay | Admitting: *Deleted

## 2018-01-05 NOTE — Telephone Encounter (Signed)
Manual transmission received.  ECGs suggest SR/ST with rare oversensing and undersensing.  ECGs printed and placed in Dr. Phyllis GingerKlein's LINQ folder for review.

## 2018-01-05 NOTE — Telephone Encounter (Signed)
LMOVM (DPR) requesting manual Carelink transmission for review.  Gave [facility] phone number for questions or concerns.  Received alert for 2 "symptom" episodes.  Available ECG appears SR.

## 2018-01-06 LAB — CUP PACEART REMOTE DEVICE CHECK
Date Time Interrogation Session: 20190708141042
Implantable Pulse Generator Implant Date: 20190503

## 2018-01-09 ENCOUNTER — Ambulatory Visit (INDEPENDENT_AMBULATORY_CARE_PROVIDER_SITE_OTHER): Payer: BC Managed Care – PPO | Admitting: *Deleted

## 2018-01-09 DIAGNOSIS — R002 Palpitations: Secondary | ICD-10-CM

## 2018-01-10 NOTE — Progress Notes (Signed)
Carelink Summary Report / Loop Recorder 

## 2018-02-09 ENCOUNTER — Ambulatory Visit (INDEPENDENT_AMBULATORY_CARE_PROVIDER_SITE_OTHER): Payer: BC Managed Care – PPO | Admitting: *Deleted

## 2018-02-09 ENCOUNTER — Other Ambulatory Visit: Payer: Self-pay | Admitting: Internal Medicine

## 2018-02-09 DIAGNOSIS — R002 Palpitations: Secondary | ICD-10-CM

## 2018-02-10 NOTE — Progress Notes (Signed)
Carelink Summary Report / Loop Recorder 

## 2018-02-16 LAB — CUP PACEART REMOTE DEVICE CHECK
Date Time Interrogation Session: 20190810143922
MDC IDC PG IMPLANT DT: 20190503

## 2018-02-24 LAB — CUP PACEART REMOTE DEVICE CHECK
Date Time Interrogation Session: 20190912160955
Implantable Pulse Generator Implant Date: 20190503

## 2018-02-27 ENCOUNTER — Telehealth: Payer: Self-pay | Admitting: Cardiology

## 2018-02-27 NOTE — Telephone Encounter (Signed)
Patient called and stated that she used her symptom activator b/c she was having large fluttering in her chest and she was dizzy. She stated that she will send a remote transmission when she gets home.

## 2018-02-28 ENCOUNTER — Telehealth: Payer: Self-pay | Admitting: Cardiology

## 2018-02-28 NOTE — Telephone Encounter (Signed)
Patient returning your call.

## 2018-02-28 NOTE — Telephone Encounter (Signed)
LVM requesting pt send a manual transmission.

## 2018-03-01 NOTE — Telephone Encounter (Signed)
Transmission received and reviewed. ECG suggests SR with ~4bt run of SVT, followed by SR/ST. Patient reports that she felt her heart flip-flop. Advised that Dr. Graciela Husbands will review ECG when he is back in the office and we will call with any recommendations. Patient is agreeable to this plan.  ECG printed and placed in Dr. Phyllis Ginger folder for review.

## 2018-03-14 ENCOUNTER — Ambulatory Visit (INDEPENDENT_AMBULATORY_CARE_PROVIDER_SITE_OTHER): Payer: BC Managed Care – PPO | Admitting: *Deleted

## 2018-03-14 DIAGNOSIS — R002 Palpitations: Secondary | ICD-10-CM | POA: Diagnosis not present

## 2018-03-15 NOTE — Progress Notes (Signed)
Carelink Summary Report / Loop Recorder 

## 2018-03-27 LAB — CUP PACEART REMOTE DEVICE CHECK
Date Time Interrogation Session: 20191015160648
MDC IDC PG IMPLANT DT: 20190503

## 2018-04-17 ENCOUNTER — Ambulatory Visit (INDEPENDENT_AMBULATORY_CARE_PROVIDER_SITE_OTHER): Payer: BC Managed Care – PPO

## 2018-04-17 DIAGNOSIS — R002 Palpitations: Secondary | ICD-10-CM | POA: Diagnosis not present

## 2018-04-17 NOTE — Progress Notes (Signed)
Carelink Summary Report / Loop Recorder 

## 2018-04-28 ENCOUNTER — Encounter: Payer: Self-pay | Admitting: Oncology

## 2018-04-28 ENCOUNTER — Other Ambulatory Visit: Payer: Self-pay

## 2018-04-28 ENCOUNTER — Encounter (HOSPITAL_COMMUNITY): Payer: Self-pay | Admitting: Emergency Medicine

## 2018-04-28 ENCOUNTER — Emergency Department (HOSPITAL_COMMUNITY)
Admission: EM | Admit: 2018-04-28 | Discharge: 2018-04-28 | Disposition: A | Discharge status: Home / Self Care | Payer: BC Managed Care – PPO | Attending: Emergency Medicine | Admitting: Emergency Medicine

## 2018-04-28 ENCOUNTER — Emergency Department (HOSPITAL_BASED_OUTPATIENT_CLINIC_OR_DEPARTMENT_OTHER): Payer: BC Managed Care – PPO

## 2018-04-28 DIAGNOSIS — I8002 Phlebitis and thrombophlebitis of superficial vessels of left lower extremity: Secondary | ICD-10-CM

## 2018-04-28 DIAGNOSIS — Z79899 Other long term (current) drug therapy: Secondary | ICD-10-CM | POA: Insufficient documentation

## 2018-04-28 DIAGNOSIS — Z95818 Presence of other cardiac implants and grafts: Secondary | ICD-10-CM | POA: Diagnosis not present

## 2018-04-28 DIAGNOSIS — Z7901 Long term (current) use of anticoagulants: Secondary | ICD-10-CM | POA: Insufficient documentation

## 2018-04-28 DIAGNOSIS — R600 Localized edema: Secondary | ICD-10-CM | POA: Diagnosis present

## 2018-04-28 DIAGNOSIS — Z87891 Personal history of nicotine dependence: Secondary | ICD-10-CM | POA: Diagnosis not present

## 2018-04-28 MED ORDER — RIVAROXABAN 20 MG PO TABS: 20.0000 mg | ORAL_TABLET | Freq: Every day | ORAL | 0 refills | Status: DC

## 2018-04-28 NOTE — Progress Notes (Signed)
*  PRELIMINARY RESULTS* Vascular Ultrasound Left lower extremity venous duplex has been completed.  Preliminary findings: No evidence of deep vein thrombosis or baker's cyst in the left lower extremity.  **Small segment of SVT in the mid left short saphenous vein, area of complaint.   Chauncey FischerCharlotte C Lacrystal Barbe 04/28/2018, 3:08 PM

## 2018-04-28 NOTE — ED Notes (Signed)
Discharge instructions and prescription discussed with Pt. Pt verbalized understanding. Pt stable and ambulatory.    

## 2018-04-28 NOTE — Discharge Instructions (Addendum)
As discussed, increase your Erica Ho for the next 4 weeks to 20mg  daily.  Use elevation and heating pads to help resolve this clot.  Plan recheck as planned within the next month to determine when or if you should return to your 10 mg dose of xaralt and to follow this clot until resolution.

## 2018-04-28 NOTE — ED Provider Notes (Signed)
[facility] EMERGENCY DEPARTMENT Provider Note   CSN: [id number] Arrival date & time: 04/28/18  1221     History   Chief Complaint Chief Complaint  Patient presents with  . Leg Swelling    HPI Erica Ho is a 37 y.o. female with past medical history as outlined below, most significant for history of pulmonary embolism which was unprovoked, with no obvious source but she does have a history of varicose veins presenting with localized edema at her left lower leg with a "knot".  She is concerned about the possibility of a new clot formation.  She denies shortness of breath or chest pain or.  She does endorse having an itchy sensation at the site and when she reached down noticed as the swelling.  She has had no treatments prior to arrival.  She takes Xarelto daily for her history of PE.  Is otherwise without complaint, denies chest pain or shortness of breath, no dizziness or weakness.  The history is provided by the patient.    Past Medical History:  Diagnosis Date  . ABLATION SUPERFICAL VIENS LEFT LEG 11/2016  . Anemia    RESOLVED  . Anxiety   . Arthritis    LOWER BACK  . Blood dyscrasia   . Dysrhythmia    PVC, PAC, SYMPTOMATIC HOLTER MONITOR TWICE AND NOTHING SHOW UP  . Headache    OCULAR MIGRAINES  . Heart palpitations    a. 05/2016: event monitor showing PAC's with one episode of 4 beats NSVT  . History of migraine headaches   . PE (pulmonary thromboembolism) (HCC)    a. 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 Varicose veins     Patient Active Problem List   Diagnosis Date Noted  . Varicose veins of left lower extremity with complications 11/30/2016  . Family history of autoimmune disorder 06/21/2016  . History of pulmonary embolism 05/26/2016  . Panic disorder 05/19/2016  . Anemia 05/17/2016  . History of migraine headaches 05/04/2016  . Acute pulmonary embolism (HCC) 05/03/2016  . Palpitations 05/03/2016  . Ocular migraine 04/20/2016  .  Gastroesophageal reflux disease without esophagitis 04/20/2016  . Varicose veins of left lower extremity with pain 09/18/2015  . Atrial fibrillation (HCC) 06/09/2015  . Hyperthyroidism 06/09/2015  . Electrolyte disorder 06/09/2015  . Dizziness 06/09/2015  . Sinus tachycardia 06/09/2015  . Atrial flutter (HCC) 06/09/2015    Past Surgical History:  Procedure Laterality Date  . CESAREAN SECTION    . CHOLECYSTECTOMY    . ENDOVENOUS ABLATION SAPHENOUS VEIN W/ LASER Left 12/15/2016   endovenous laser ablation L GSV and stab phlebectomy > 20 incisions left leg by Josephina Gip MD   . LOOP RECORDER INSERTION N/A 09/30/2017   Procedure: LOOP RECORDER INSERTION;  Surgeon: Duke Salvia, MD;  Location: [facility] INVASIVE CV LAB;  Service: Cardiovascular;  Laterality: N/A;  . TONSILLECTOMY    . TUBAL LIGATION       OB History   None      Home Medications    Prior to Admission medications   Medication Sig Start Date End Date Taking? Authorizing Provider  acetaminophen (TYLENOL) 500 MG tablet Take 500-1,000 mg by mouth daily as needed for moderate pain or headache.     [provider]  clonazePAM (KLONOPIN) 0.5 MG tablet Take 0.5 mg by mouth 2 (two) times daily.  01/03/17   [provider]  ferrous sulfate 325 (65 FE) MG EC tablet Take 325 mg by mouth daily with breakfast.  [provider]  rivaroxaban (XARELTO) 20 MG TABS tablet Take 1 tablet (20 mg total) by mouth daily. 04/28/18   Burgess Amor, PA-C    Family History Family History  Problem Relation Age of Onset  . Diabetes Mother   . Clotting disorder Neg Hx     Social History Social History   Tobacco Use  . Smoking status: Former Smoker    Last attempt to quit: 08/26/2011    Years since quitting: 6.6  . Smokeless tobacco: Never Used  Substance Use Topics  . Alcohol use: No    Alcohol/week: 0.0 standard drinks  . Drug use: No     Allergies   Imitrex [sumatriptan]; Cardizem [diltiazem]; Isovue  [iopamidol]; Levofloxacin; and Nsaids   Review of Systems Review of Systems  Constitutional: Negative for fever.  HENT: Negative.   Eyes: Negative.   Respiratory: Negative for chest tightness and shortness of breath.   Cardiovascular: Negative for chest pain.  Gastrointestinal: Negative for nausea and vomiting.  Genitourinary: Negative.   Musculoskeletal: Negative for arthralgias, joint swelling and neck pain.  Skin: Positive for color change. Negative for rash and wound.  Neurological: Negative for dizziness, weakness, light-headedness, numbness and headaches.  Psychiatric/Behavioral: Negative.      Physical Exam Updated Vital Signs BP 111/77 (BP Location: Right Arm)   Pulse 98   Temp 97.6 F (36.4 C) (Oral)   Resp 18   SpO2 99%   Physical Exam  Constitutional: She appears well-developed and well-nourished.  HENT:  Head: Normocephalic and atraumatic.  Eyes: Conjunctivae are normal.  Neck: Normal range of motion.  Cardiovascular: Regular rhythm, normal heart sounds and intact distal pulses.  Borderline tachycardia.  Pulmonary/Chest: Effort normal and breath sounds normal. She has no wheezes.  Abdominal: Soft. Bowel sounds are normal. There is no tenderness.  Musculoskeletal: Normal range of motion.  There is a 3 cm area of raised nontender induration at her left lateral calf.  The site is superficial yet linear in shape.  There is a bluish-gray discoloration at the site suggesting ecchymosis.  Homans sign is negative.  Skin is intact without trauma or puncture.  Dorsalis pedis pulses are equal and full.  Neurological: She is alert.  Skin: Skin is warm and dry.  Psychiatric: She has a normal mood and affect.  Nursing note and vitals reviewed.    ED Treatments / Results  Labs (all labs ordered are listed, but only abnormal results are displayed) Labs Reviewed - No data to display  EKG None  Radiology Vas [place] Lower Extremity Venous (dvt) (only [facility])  Result  Date: 04/28/2018  Lower Venous Study Performing Technologist: Levada Schilling RDMS, RVT  Examination Guidelines: A complete evaluation includes B-mode imaging, spectral Doppler, color Doppler, and power Doppler as needed of all accessible portions of each vessel. Bilateral testing is considered an integral part of a complete examination. Limited examinations for reoccurring indications may be performed as noted.  Right Venous Findings: +---+---------------+---------+-----------+----------+-------+    CompressibilityPhasicitySpontaneityPropertiesSummary +---+---------------+---------+-----------+----------+-------+ CFVFull           Yes      Yes                          +---+---------------+---------+-----------+----------+-------+  Left Venous Findings: +---------+---------------+---------+-----------+----------+-------+          CompressibilityPhasicitySpontaneityPropertiesSummary +---------+---------------+---------+-----------+----------+-------+ CFV      Full           Yes      Yes                          +---------+---------------+---------+-----------+----------+-------+  SFJ      Full                                                 +---------+---------------+---------+-----------+----------+-------+ FV Prox  Full                                                 +---------+---------------+---------+-----------+----------+-------+ FV Mid   Full                                                 +---------+---------------+---------+-----------+----------+-------+ FV DistalFull                                                 +---------+---------------+---------+-----------+----------+-------+ PFV      Full                                                 +---------+---------------+---------+-----------+----------+-------+ POP      Full           Yes      Yes                           +---------+---------------+---------+-----------+----------+-------+ PTV      Full                                                 +---------+---------------+---------+-----------+----------+-------+ PERO     Full                                                 +---------+---------------+---------+-----------+----------+-------+ GSV      Full                                                 +---------+---------------+---------+-----------+----------+-------+ SSV      Partial                            dilated   Acute   +---------+---------------+---------+-----------+----------+-------+    Summary: Right: No evidence of common femoral vein obstruction. Left: Findings consistent with acute superficial vein thrombosis involving the left small saphenous vein. There is no evidence of deep vein thrombosis in the lower extremity. No cystic structure found in the popliteal fossa.  *See table(s) above for measurements and observations. Electronically signed by Lemar LivingsBrandon Cain MD on 04/28/2018 at 3:35:58 PM.    Final  Procedures Procedures (including critical care time)  Medications Ordered in ED Medications - No data to display   Initial Impression / Assessment and Plan / ED Course  I have reviewed the triage vital signs and the nursing notes.  Pertinent labs & imaging results that were available during my care of the patient were reviewed by me and considered in my medical decision making (see chart for details).     Suspect hematoma, although this could be a superficial thrombophlebitis, although there is no erythema or increased warmth.  Given her history of PE we will ultrasound her to ensure that this is not a DVT or superficial thrombophlebitis.  Patient does have a left lower extremity superficial thrombophlebitis.  This was discussed with her.  She expressed concerns regarding this as she states the source of her prior PE was never found, yet she had had a history of  superficial thrombophlebitis.  Dr. Cyndie Chime suspects that may have been the source of that PE.  She states that her Xarelto had been decreased from 20 mg to 10 mg approximately 9 months ago and wonders if she should be bumped up to the 20 mg dose again.  I spoke with Dr. Domenic Polite regarding this and he agreed that it would be reasonable to do that if she has no contraindications for the higher dosages bleeding risks or falls.  She was prescribed a 20 mg dosage, also discussed elevation and heat to the site.  She will plan to follow-up with her provider within the next 30 days to reassess and determine whether she needs to stay on this increased dose, and also to evaluate for resolution of this current clot.  She notes that she actually has a appointment with Dr. Antoine Poche in 5 days, and also sees Dr. Hart Rochester for chronic issues with varicose veins.  Final Clinical Impressions(s) / ED Diagnoses   Final diagnoses:  Thrombophlebitis of superficial veins of left lower extremity    ED Discharge Orders         Ordered    rivaroxaban (XARELTO) 20 MG TABS tablet  Daily     04/28/18 1707           Burgess Amor, PA-C 04/28/18 1803    Benjiman Core, MD 04/29/18 [phone]

## 2018-04-28 NOTE — ED Notes (Signed)
Paged vascular tech to follow up on patient's study

## 2018-04-28 NOTE — ED Triage Notes (Signed)
Pt presents to ED for assessment of left lower leg knot she noted today.  No pain with palpation or movement.  C/o of "itchiness".  Hx of clots, on Xarelto 10mg , for PE 2 years ago with unknown cause.

## 2018-05-02 ENCOUNTER — Telehealth: Payer: Self-pay | Admitting: *Deleted

## 2018-05-02 ENCOUNTER — Other Ambulatory Visit: Payer: Self-pay | Admitting: *Deleted

## 2018-05-02 DIAGNOSIS — I83812 Varicose veins of left lower extremities with pain: Secondary | ICD-10-CM

## 2018-05-02 NOTE — Telephone Encounter (Signed)
Erica Ho returning my call.  States [facility] and had a venous duplex of left leg as she had a "knot" in left lower extremity.  Reviewed venous duplex from 04-28-2018 which showed no DVT but did indicate superficial thrombophlebitis of left lower extremity. Erica Ho states she is currently on Xarelto 20 mg as she has a history of PE. Erica Ho has appointment with Dr. Antoine PocheHochrein scheduled for tomorrow 05-03-2018.   Will have [facility] schedulers book an appointment with Dr. Darrick PennaFields along with venous reflux of left leg.

## 2018-05-02 NOTE — Progress Notes (Signed)
Cardiology Office Note   Date:  05/03/2018   ID:  Erica Ho, DOB 11-22-1980, MRN [id number]  PCP:  Mikael Spray, NP  Cardiologist:   Rollene Rotunda, MD  Referring:  Mikael Spray, NP  No chief complaint on file.     History of Present Illness: Erica Ho is a 37 y.o. female who presents for evaluation of dizziness. She has had an extensive work up.  She has been seen by physicians at [facility], [place] and here.   She was seen by [facility] cardiology.  She had 30 day event monitor was found have PACs but no symptomatic dysrhythmias. There was sinus tachycardia noted. She did have an echocardiogram and it was normal.   At one point she had  loss of vision. She had an evaluation to include a CT which was essentially unremarkable. She was seeing neurology and had an MRI . In December 2017 she had shortness of breath and some chest discomfort and had a pulmonary embolism documented. She's been on anticoagulation. She saw cardiology after this and had a question of atrial fibrillation though no documentation. She wore an event monitor. She had a superficial thrombosis but was never been found to have a DVT. She's had no clotting disorders documented.   She has had chest pain and a negative POET (Plain Old Exercise Treadmill).  Work up at [facility] did not demonstrate an autonomic disorder.   She saw Dr. Graciela Husbands and had an event monitor placed.  At one point I reviewed 107 patients of this.  She had PVCs and PACs.  She has had a loop implanted and I have reviewed this and this is followed by Dr. Graciela Husbands.  She has SVT and and she had occasional 4 beat runs of SVT.    Most recently she was in the ED with superficial thrombophlebitis and her Xarelto was increased back to 20 mg.  It had been reduced secondary to menorrhagia.  She says that she is having some increased uterine bleeding since going back on a higher dose but it is manageable.  She actually feels relatively well.  The leg is not  bothering her as much as it did when it was diagnosed acutely in November.  She is not really noticing the palpitations.  She notices them occasionally but she has not had any presyncope or syncope.  She has had no sustained dysrhythmias.  She has no chest pressure, neck or arm discomfort.  She has had no new shortness of breath, PND or orthopnea.   Past Medical History:  Diagnosis Date  . ABLATION SUPERFICAL VIENS LEFT LEG 11/2016  . Anemia    RESOLVED  . Anxiety   . Arthritis    LOWER BACK  . Blood dyscrasia   . Dysrhythmia    PVC, PAC, SYMPTOMATIC HOLTER MONITOR TWICE AND NOTHING SHOW UP  . Headache    OCULAR MIGRAINES  . Heart palpitations    a. 05/2016: event monitor showing PAC's with one episode of 4 beats NSVT  . History of migraine headaches   . PE (pulmonary thromboembolism) (HCC)    a. 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 Varicose veins     Past Surgical History:  Procedure Laterality Date  . CESAREAN SECTION    . CHOLECYSTECTOMY    . ENDOVENOUS ABLATION SAPHENOUS VEIN W/ LASER Left 12/15/2016   endovenous laser ablation L GSV and stab phlebectomy > 20 incisions left leg by Josephina Gip MD   . LOOP RECORDER  INSERTION N/A 09/30/2017   Procedure: LOOP RECORDER INSERTION;  Surgeon: Duke Salvia, MD;  Location: [facility] INVASIVE CV LAB;  Service: Cardiovascular;  Laterality: N/A;  . TONSILLECTOMY    . TUBAL LIGATION       Current Outpatient Medications  Medication Sig Dispense Refill  . acetaminophen (TYLENOL) 500 MG tablet Take 500-1,000 mg by mouth daily as needed for moderate pain or headache.     . clonazePAM (KLONOPIN) 0.5 MG tablet Take 0.5 mg by mouth 2 (two) times daily.     . ferrous sulfate 325 (65 FE) MG EC tablet Take 325 mg by mouth daily with breakfast.     . rivaroxaban (XARELTO) 20 MG TABS tablet Take 1 tablet (20 mg total) by mouth daily. 30 tablet 0   No current facility-administered medications for this visit.     Allergies:   Imitrex [sumatriptan];  Cardizem [diltiazem]; Isovue [iopamidol]; Levofloxacin; and Nsaids    ROS:  Please see the history of present illness.   Otherwise, review of systems are positive for none.   All other systems are reviewed and negative.    PHYSICAL EXAM: VS:  BP 102/70   Pulse 64   Ht 5' (1.524 m)   Wt 150 lb (68 kg)   BMI 29.29 kg/m  , BMI Body mass index is 29.29 kg/m.  GENERAL:  Well appearing NECK:  No jugular venous distention, waveform within normal limits, carotid upstroke brisk and symmetric, no bruits, no thyromegaly LUNGS:  Clear to auscultation bilaterally CHEST:  Unremarkable HEART:  PMI not displaced or sustained,S1 and S2 within normal limits, no S3, no S4, no clicks, no rubs, no murmurs ABD:  Flat, positive bowel sounds normal in frequency in pitch, no bruits, no rebound, no guarding, no midline pulsatile mass, no hepatomegaly, no splenomegaly EXT:  2 plus pulses throughout, no edema, no cyanosis no clubbing   EKG:  EKG is not ordered.    Recent Labs: 12/19/2017: ALT 11; BUN 17; Creatinine, Ser 0.78; Hemoglobin 13.8; Platelets 247; Potassium 4.4; Sodium 138    Lipid Panel    Component Value Date/Time   CHOL 173 08/30/2016   TRIG 46 08/30/2016   HDL 68 08/30/2016   LDLCALC 96 08/30/2016      Wt Readings from Last 3 Encounters:  05/03/18 150 lb (68 kg)  12/19/17 145 lb (65.8 kg)  09/30/17 138 lb (62.6 kg)      Other studies Reviewed:  Additional studies/ records that were reviewed today include: ED records, loop recorder, oncology records.   (Extensive review) Review of the above records demonstrates:       ASSESSMENT AND PLAN:   THROMBOPHLEBITIS:    She has a history of pulmonary embolism.  She is on Eliquis.  She was on Xarelto because she thought the Eliquis was causing palpitations but these didn't improve so she went back to Xarelto.  She saw Dr. Cyndie Chime in July and it was decided to continue low dose Xareloto..  This is now increased back to full dose as  above.    She is going to reduce back down to 10 mg in a couple of weeks.  I agree with this suggestion.  ARRHYTHMIA:    She has had no symptomatic dysrhythmias since I last saw her.  I reviewed the records and the most recent event monitors.  There have been no sustained arrhythmias.  There is been no fibrillation.  No change in therapy.   CHEST PAIN:  This is atypical and  she had a negative POET (Plain Old Exercise Treadmill).  No change in therapy.   Current medicines are reviewed at length with the patient today.  The patient does not have concerns regarding medicines.  The following changes have been made:  None  Labs/ tests ordered today include:     No orders of the defined types were placed in this encounter.    Disposition:   FU with me in 12 months.   Signed, Rollene RotundaJames Brentlee Sciara, MD  05/03/2018 3:37 PM    [facility]

## 2018-05-02 NOTE — Telephone Encounter (Signed)
Returning Erica Ho's voice telephone message from 05-01-2018. Left telephone voice message for her to call me today at [phone].

## 2018-05-03 ENCOUNTER — Encounter: Payer: Self-pay | Admitting: Cardiology

## 2018-05-03 ENCOUNTER — Ambulatory Visit (INDEPENDENT_AMBULATORY_CARE_PROVIDER_SITE_OTHER): Payer: BC Managed Care – PPO | Admitting: Cardiology

## 2018-05-03 VITALS — BP 102/70 | HR 64 | Ht 60.0 in | Wt 150.0 lb

## 2018-05-03 DIAGNOSIS — R002 Palpitations: Secondary | ICD-10-CM

## 2018-05-03 DIAGNOSIS — I809 Phlebitis and thrombophlebitis of unspecified site: Secondary | ICD-10-CM | POA: Diagnosis not present

## 2018-05-03 NOTE — Patient Instructions (Signed)

## 2018-05-08 ENCOUNTER — Telehealth: Payer: Self-pay | Admitting: Vascular Surgery

## 2018-05-08 NOTE — Telephone Encounter (Signed)
sch appt spk to pt mld ltr 06/29/2018 2pm LE Reflux 245pm New VV

## 2018-05-08 NOTE — Telephone Encounter (Signed)
-----   Message from Micah FlesherSonya D Rankin, RN sent at 05/02/2018 10:27 AM EST ----- Regarding: scheduling [name] was patient of [facility] who had endovenous laser ablation of left  GSV on 12-15-2016 by Dr. Hart RochesterLawson. She was seen in ED on 04-28-2018 and had a venous duplex done.  She was diagnosed with superficial thrombophlebitis of left leg.  She is on Xarelto. Please schedule a VV appointment with Dr. Darrick PennaFields along with a venous reflux study of left leg. Order is in Epic. Please call her at [phone] with the appointment dates and times.    Thanks, Lamar LaundrySonya

## 2018-05-12 ENCOUNTER — Encounter: Payer: Self-pay | Admitting: Oncology

## 2018-05-12 ENCOUNTER — Other Ambulatory Visit: Payer: Self-pay | Admitting: Oncology

## 2018-05-12 DIAGNOSIS — Z86711 Personal history of pulmonary embolism: Secondary | ICD-10-CM

## 2018-05-12 DIAGNOSIS — I83812 Varicose veins of left lower extremities with pain: Secondary | ICD-10-CM

## 2018-05-12 DIAGNOSIS — I8290 Acute embolism and thrombosis of unspecified vein: Secondary | ICD-10-CM

## 2018-05-12 NOTE — Progress Notes (Signed)
Phone call to patient on December 2. I was out of the office for the Thanksgiving holiday when the patient called our office from the emergency department on November 29. She has a history of a small volume left lower lobe pulmonary embolus in December 2017.  She was on full dose anticoagulation through September 2018.  I reduced the dose down to 10 mg daily at that time.  I felt her PE was likely provoked related to advanced varicose veins in her left leg.  She had laser treatment with multiple stab phlebectomies in July 2018.  I felt that a thrombosis in 1 of her varicose veins provoked by the surgery was the likely reason for the initial clot.  She initially had a persistent elevation of her d-dimer.  She was having some atypical chest symptoms.  She elected to stay on anticoagulation.  At the current time, she developed a painful lump on her left calf.  The emergency room physician describes a 3 cm area of raised, nontender, induration left lateral calf which was superficial, linear in shape, with a bluish-gray discoloration which she thought was an underlying ecchymosis.  Calf was nontender.  Negative Homans sign.  No signs of infection or inflammation.  Doppler study showed a superficial thrombophlebitis.  She was concerned that this happened while she was on a blood thinner.  Emergency room physician contacted hematologist on call Dr Candise CheKale.  He recommended resuming the full dose of the Xarelto back up to 20 mg daily.  Once again I think that a recurrent superficial phlebitis is related to extensive varicose veins.  My clinical impression is that increasing her Xarelto dose is not going to make a significant difference in potential recurrences. I offered her a office follow-up visit which she declined at this time.  She is going to stay on the 20 mg of Xarelto for now.  She may need to see her vascular surgeon again to see if an additional surgical procedure is indicated.  She understands that I will be  retiring at the end of March.  I am going to make a referral for her to Dr. Candise [facility]Kale at the [facility].

## 2018-05-18 DIAGNOSIS — Z7901 Long term (current) use of anticoagulants: Secondary | ICD-10-CM

## 2018-05-18 DIAGNOSIS — I809 Phlebitis and thrombophlebitis of unspecified site: Secondary | ICD-10-CM

## 2018-05-18 DIAGNOSIS — Z86711 Personal history of pulmonary embolism: Secondary | ICD-10-CM

## 2018-05-19 ENCOUNTER — Ambulatory Visit (INDEPENDENT_AMBULATORY_CARE_PROVIDER_SITE_OTHER): Payer: BC Managed Care – PPO

## 2018-05-19 DIAGNOSIS — R002 Palpitations: Secondary | ICD-10-CM

## 2018-05-19 NOTE — Progress Notes (Signed)
Carelink Summary Report / Loop Recorder 

## 2018-05-30 ENCOUNTER — Encounter (HOSPITAL_COMMUNITY): Payer: BC Managed Care – PPO

## 2018-06-01 ENCOUNTER — Other Ambulatory Visit: Payer: Self-pay | Admitting: Internal Medicine

## 2018-06-01 ENCOUNTER — Telehealth: Payer: Self-pay

## 2018-06-01 NOTE — Telephone Encounter (Signed)
Manual transmission received and reviewed. Available "symptom" episode ECGs show SR with rare PVCs.

## 2018-06-01 NOTE — Telephone Encounter (Signed)
Pt aware she needs to send in remote transmission.

## 2018-06-04 LAB — CUP PACEART REMOTE DEVICE CHECK
Date Time Interrogation Session: 20191117164216
MDC IDC PG IMPLANT DT: 20190503

## 2018-06-06 ENCOUNTER — Encounter: Payer: BC Managed Care – PPO | Admitting: Vascular Surgery

## 2018-06-08 ENCOUNTER — Telehealth: Payer: Self-pay | Admitting: Cardiology

## 2018-06-08 NOTE — Telephone Encounter (Signed)
Manual transmission received and reviewed. Symptom episode #62 printed and placed in Dr. Phyllis GingerKlein's LINQ folder for review. ECG suggests SR to ST. Remaining symptom episodes show NSR w/occasional PVCs.

## 2018-06-08 NOTE — Telephone Encounter (Signed)
Spoke w/ pt and requested that she send a manual transmission w/ her home monitor. Pt verbalized understanding.  

## 2018-06-08 NOTE — Telephone Encounter (Signed)
LMOVM for pt to send a manual transmission w/ her home monitor.  

## 2018-06-12 DIAGNOSIS — Z86711 Personal history of pulmonary embolism: Secondary | ICD-10-CM | POA: Diagnosis not present

## 2018-06-18 LAB — CUP PACEART REMOTE DEVICE CHECK
Date Time Interrogation Session: 20191220174142
MDC IDC PG IMPLANT DT: 20190503

## 2018-06-21 ENCOUNTER — Ambulatory Visit (INDEPENDENT_AMBULATORY_CARE_PROVIDER_SITE_OTHER): Payer: BC Managed Care – PPO

## 2018-06-21 DIAGNOSIS — R002 Palpitations: Secondary | ICD-10-CM

## 2018-06-22 NOTE — Progress Notes (Signed)
Carelink Summary Report / Loop Recorder 

## 2018-06-25 LAB — CUP PACEART REMOTE DEVICE CHECK
Date Time Interrogation Session: 20200122173810
MDC IDC PG IMPLANT DT: 20190503

## 2018-06-28 ENCOUNTER — Telehealth: Payer: Self-pay

## 2018-06-28 NOTE — Telephone Encounter (Signed)
Left a message for the pt to send a manual home remote transmission.

## 2018-06-29 ENCOUNTER — Encounter (HOSPITAL_COMMUNITY): Payer: BC Managed Care – PPO

## 2018-06-29 ENCOUNTER — Encounter: Payer: BC Managed Care – PPO | Admitting: Vascular Surgery

## 2018-06-29 NOTE — Telephone Encounter (Signed)
Manual transmission received and reviewed. "Symptom" episode ECG from 06/27/18 shows SR with occasional PVCs.

## 2018-06-30 ENCOUNTER — Other Ambulatory Visit: Payer: Self-pay | Admitting: Oncology

## 2018-06-30 NOTE — Telephone Encounter (Signed)
Please call pt: is she taking 10 mg or 20 mg of Xarelto? Did she get an appointment yet with Dr Candise Che for Hematology follow up? I can only refill her Xarelto through end of March. After that, either her primary care or Dr Candise Che can refill.

## 2018-06-30 NOTE — Telephone Encounter (Signed)
Called pt - left message to call me back.  Call from pt - stated she's taking 20 mg of Xarelto and she does not need a refill at this time. Stated the pharmacy sent the wrong request. She has not scheduled an appt with Dr Candise Che; she will call his office. Informed of Dr Timoteo Expose retirement - will need to call PCP or Dr Clyda Greener office for further refills. Voiced understanding.

## 2018-07-18 ENCOUNTER — Telehealth: Payer: Self-pay

## 2018-07-18 NOTE — Telephone Encounter (Signed)
I left a message on pt voicemail to send a manual transmission. 

## 2018-07-19 NOTE — Telephone Encounter (Signed)
Transmission received symptom episodes SR w/ PVCs/ AT

## 2018-07-19 NOTE — Telephone Encounter (Signed)
I left a message for the pt to send a manual transmission with her home monitor

## 2018-07-19 NOTE — Telephone Encounter (Signed)
Pt called stating she sent two transmissions last night. Lanora Manis states she will give the pt a call back.

## 2018-07-20 NOTE — Telephone Encounter (Signed)
Late entry spoke with pt she stated that she just felt her heart skipping some with the symptom episodes

## 2018-07-24 ENCOUNTER — Ambulatory Visit (INDEPENDENT_AMBULATORY_CARE_PROVIDER_SITE_OTHER): Payer: BC Managed Care – PPO | Admitting: *Deleted

## 2018-07-24 DIAGNOSIS — R002 Palpitations: Secondary | ICD-10-CM | POA: Diagnosis not present

## 2018-07-26 ENCOUNTER — Telehealth: Payer: Self-pay | Admitting: Cardiology

## 2018-07-26 LAB — CUP PACEART REMOTE DEVICE CHECK
Date Time Interrogation Session: 20200224174125
Implantable Pulse Generator Implant Date: 20190503

## 2018-07-26 NOTE — Telephone Encounter (Signed)
Patient called and stated that she is not feeling well. She sent a manual transmission. She would like a call back to discuss what is going on.

## 2018-07-26 NOTE — Telephone Encounter (Signed)
Transmission received and reviewed. Presening rhythm NSR. "Symptom" episode from 07/26/18 shows NSR with occasional PACs and PVCs. Patient reports that she felt 5-6 palpitations back to back, suddenly got hot, felt her face flush, and got a headache. She reports that she felt her BP was elevated. BP 144/92 by the time she got home. BP typically 110s/70s. Symptoms improved now.  Advised I will send this message to Dr. Graciela Husbands for review/recommendations. She is agreeable to this plan.

## 2018-08-01 ENCOUNTER — Encounter: Payer: Self-pay | Admitting: *Deleted

## 2018-08-01 NOTE — Progress Notes (Signed)
Carelink Summary Report / Loop Recorder 

## 2018-08-02 NOTE — Telephone Encounter (Signed)
Lets have her try mag oxide 400 mg bid and see if that settles things down thx

## 2018-08-04 MED ORDER — MAGNESIUM OXIDE 400 (241.3 MG) MG PO TABS: 400.0000 mg | ORAL_TABLET | Freq: Every day | ORAL | Status: AC

## 2018-08-04 NOTE — Telephone Encounter (Signed)
Sent pt recommendations via MyChart.

## 2018-08-14 ENCOUNTER — Encounter: Payer: Self-pay | Admitting: *Deleted

## 2018-08-14 ENCOUNTER — Other Ambulatory Visit: Payer: Self-pay | Admitting: Internal Medicine

## 2018-08-17 ENCOUNTER — Encounter: Payer: Self-pay | Admitting: *Deleted

## 2018-08-21 NOTE — Telephone Encounter (Signed)
See symptom episode below.

## 2018-08-28 ENCOUNTER — Other Ambulatory Visit: Payer: Self-pay

## 2018-08-28 ENCOUNTER — Ambulatory Visit (INDEPENDENT_AMBULATORY_CARE_PROVIDER_SITE_OTHER): Payer: BC Managed Care – PPO | Admitting: *Deleted

## 2018-08-28 DIAGNOSIS — R002 Palpitations: Secondary | ICD-10-CM

## 2018-08-28 LAB — CUP PACEART REMOTE DEVICE CHECK
Date Time Interrogation Session: 20200328181042
Implantable Pulse Generator Implant Date: 20190503

## 2018-09-01 ENCOUNTER — Telehealth: Payer: Self-pay | Admitting: Hematology

## 2018-09-01 ENCOUNTER — Encounter: Payer: Self-pay | Admitting: Hematology

## 2018-09-01 NOTE — Telephone Encounter (Signed)
A new hem appt has been scheduled for the pt to see Dr. Candise Che on 5/21 at 1pm. Letter mailed.

## 2018-09-07 NOTE — Progress Notes (Signed)
Carelink Summary Report / Loop Recorder 

## 2018-09-13 IMAGING — CT CT ANGIO CHEST
2 of 6 series · 18 of 36 positions shown · IV contrast (Omni 300)
Comparison: Exams dating back through 05/02/2016.

CLINICAL DATA: Chest pain for 10 minutes with elevated D-dimer.

EXAM:
CT ANGIOGRAPHY CHEST WITH CONTRAST
TECHNIQUE: Multidetector CT imaging of the chest was performed using the
standard protocol during bolus administration of intravenous
contrast. Multiplanar CT image reconstructions and MIPs were
obtained to evaluate the vascular anatomy.
CONTRAST:  80 cc Isovue 370 IV

[Series 8: pe thins · axial · 0.74mm/px · z∈[+958,+1214]mm · 17 of 287 slices shown]
[im 16/287  lung]
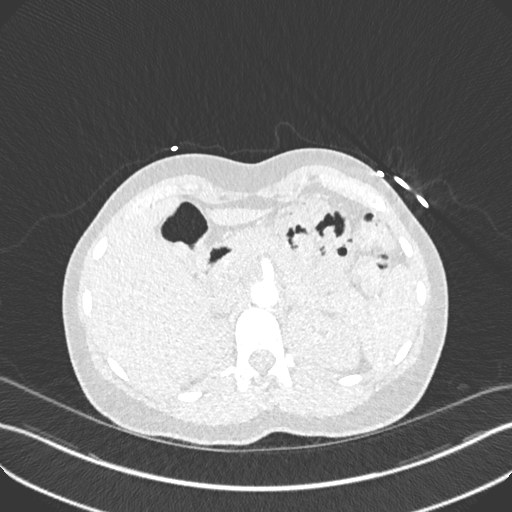
[im 32/287  mediastinal]
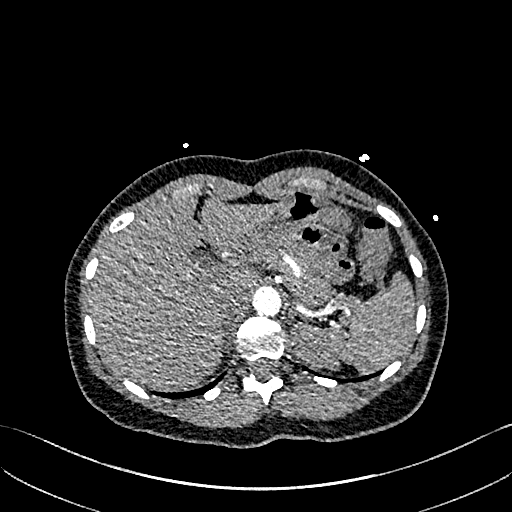
[im 48/287  lung]
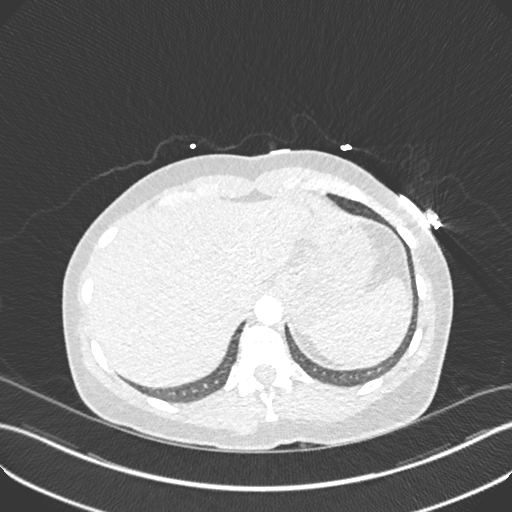
[im 64/287  mediastinal]
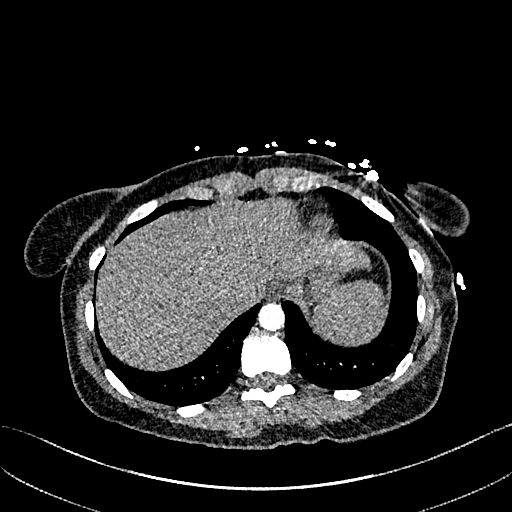
[im 80/287  lung]
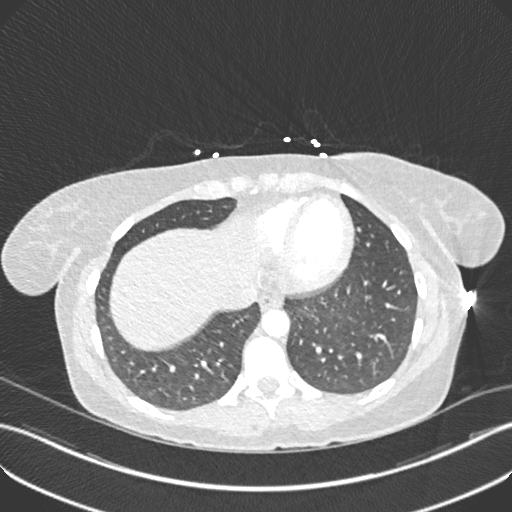
[im 96/287  mediastinal]
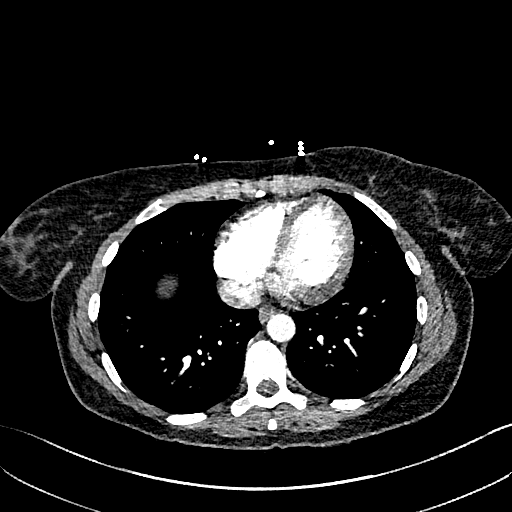
[im 112/287  lung]
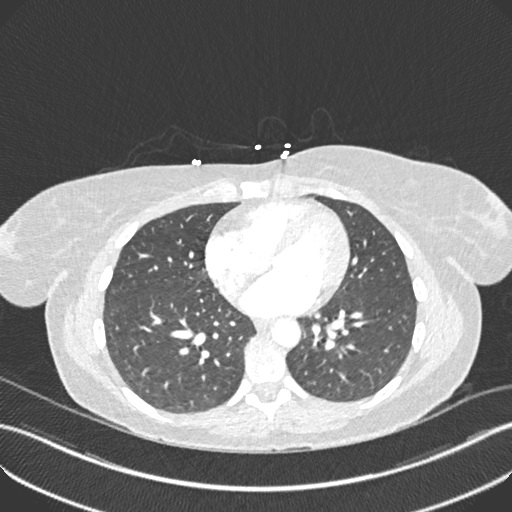
[im 128/287  mediastinal]
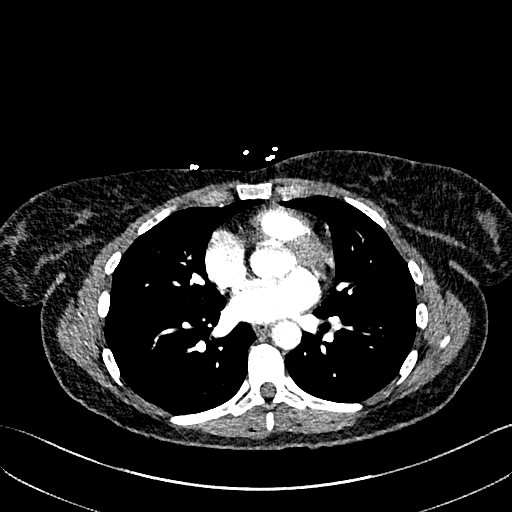
[im 144/287  lung]
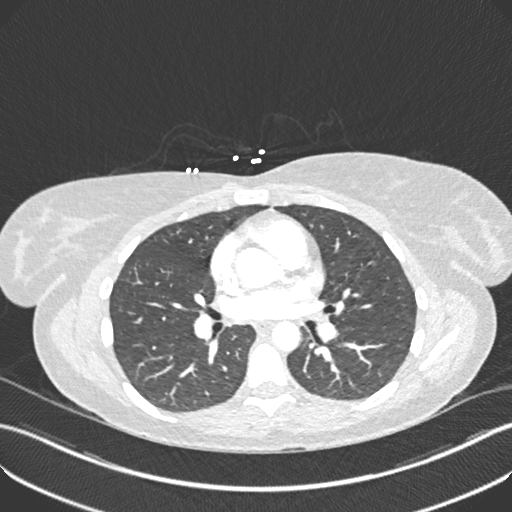
[im 159/287  mediastinal]
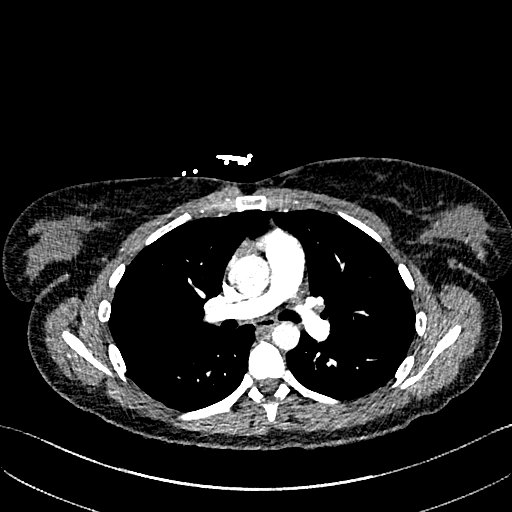
[im 175/287  lung]
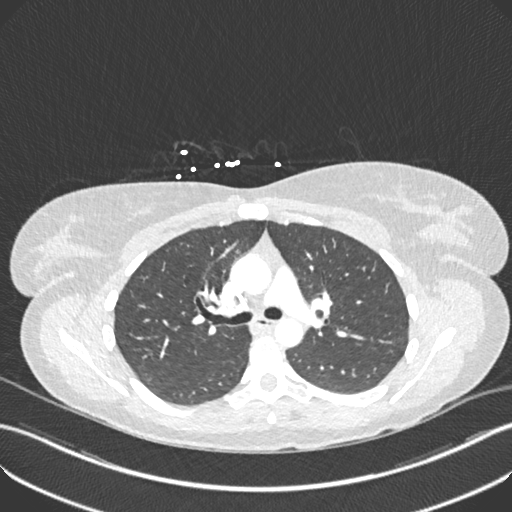
[im 191/287  mediastinal]
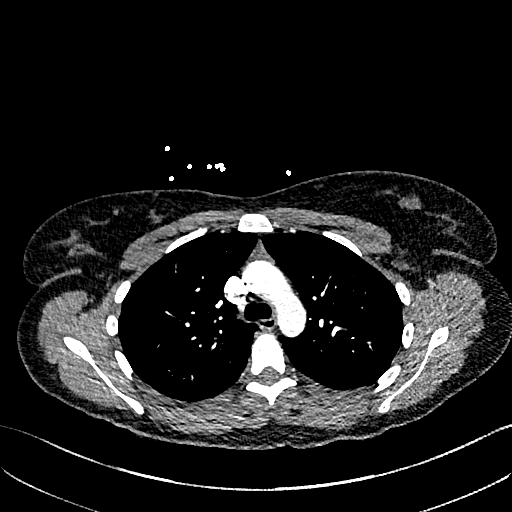
[im 207/287  lung]
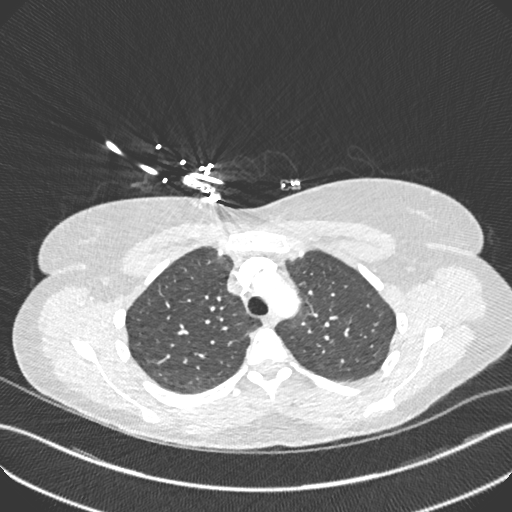
[im 223/287  mediastinal]
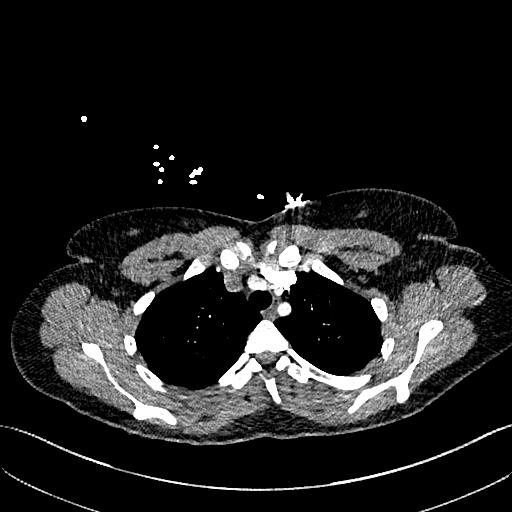
[im 239/287  lung]
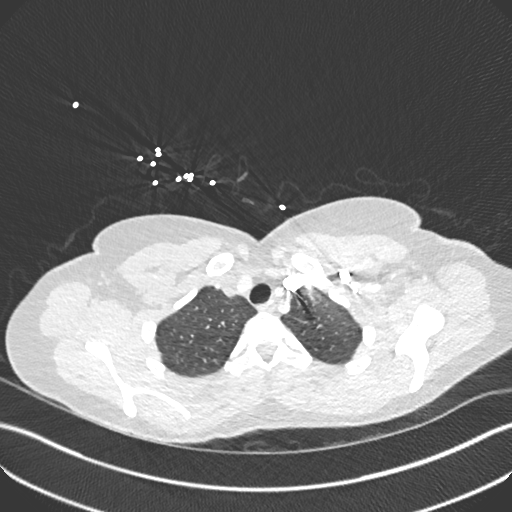
[im 255/287  mediastinal]
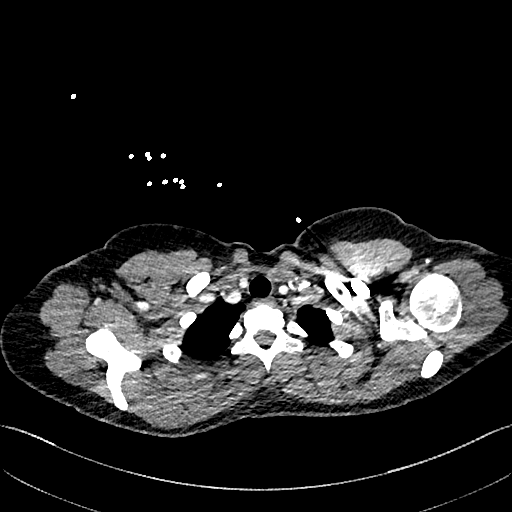
[im 271/287  lung]
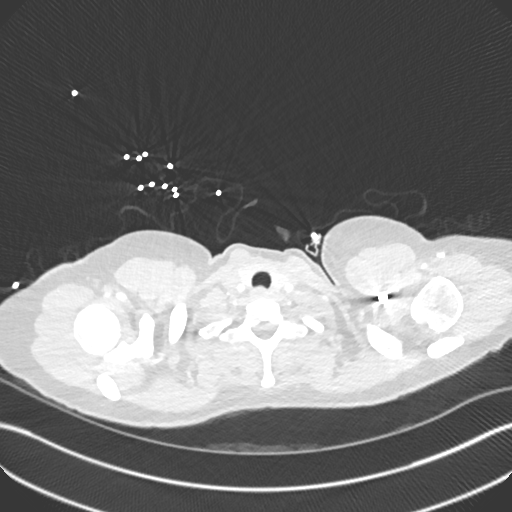

[Series 9: pe 2mm cor · coronal · 0.59mm/px · 1 of 108 slices shown]
[im 54/108  mediastinal]
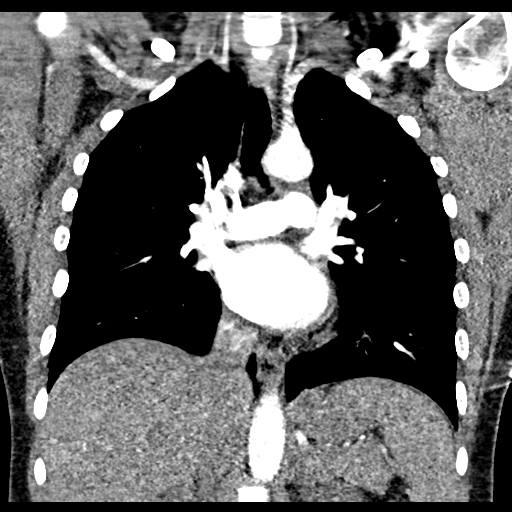

[18 of 36 positions shown; findings below may reference images not displayed]

FINDINGS: Cardiovascular: Satisfactory opacification of the pulmonary arteries
to the segmental level. No evidence of pulmonary embolism. Normal
heart size. No pericardial effusion. No aortic aneurysm or
dissection. Normal branch pattern of the great vessels

Mediastinum/Nodes: No enlarged mediastinal, hilar, or axillary lymph
nodes. Thyroid gland, trachea, and esophagus demonstrate no
significant findings.

Lungs/Pleura: Stable subpleural 3 mm nodule in the right lower lobe
almost certainly to be benign. No dominant mass, effusion or
pneumonic consolidation. No pneumothorax.

Upper Abdomen: Status post cholecystectomy. No acute upper abdominal
abnormality.

Musculoskeletal: No chest wall abnormality. No acute or significant
osseous findings.

Review of the MIP images confirms the above findings.
IMPRESSION: 1. No acute pulmonary embolus.
2. Stable 3 mm right lower lobe pulmonary nodule dating back at
least 19 months. No additional followup recommendations.

## 2018-09-28 ENCOUNTER — Other Ambulatory Visit: Payer: Self-pay

## 2018-09-28 ENCOUNTER — Ambulatory Visit (INDEPENDENT_AMBULATORY_CARE_PROVIDER_SITE_OTHER): Payer: BC Managed Care – PPO | Admitting: *Deleted

## 2018-09-28 DIAGNOSIS — R002 Palpitations: Secondary | ICD-10-CM

## 2018-09-28 LAB — CUP PACEART REMOTE DEVICE CHECK
Date Time Interrogation Session: 20200430184234
Implantable Pulse Generator Implant Date: 20190503

## 2018-10-05 NOTE — Progress Notes (Signed)
Carelink Summary Report / Loop Recorder 

## 2018-10-19 ENCOUNTER — Other Ambulatory Visit: Payer: BC Managed Care – PPO

## 2018-10-19 ENCOUNTER — Encounter: Payer: BC Managed Care – PPO | Admitting: Hematology

## 2018-10-19 NOTE — Addendum Note (Signed)
Addended by: Neomia Dear on: 10/19/2018 06:01 PM   Modules accepted: Orders

## 2018-10-31 ENCOUNTER — Ambulatory Visit (INDEPENDENT_AMBULATORY_CARE_PROVIDER_SITE_OTHER): Payer: BC Managed Care – PPO | Admitting: *Deleted

## 2018-10-31 DIAGNOSIS — R002 Palpitations: Secondary | ICD-10-CM | POA: Diagnosis not present

## 2018-10-31 LAB — CUP PACEART REMOTE DEVICE CHECK
Date Time Interrogation Session: 20200602194054
Implantable Pulse Generator Implant Date: 20190503

## 2018-11-09 NOTE — Progress Notes (Signed)
Carelink Summary Report / Loop Recorder 

## 2018-12-04 ENCOUNTER — Ambulatory Visit (INDEPENDENT_AMBULATORY_CARE_PROVIDER_SITE_OTHER): Payer: BC Managed Care – PPO | Admitting: *Deleted

## 2018-12-04 DIAGNOSIS — R002 Palpitations: Secondary | ICD-10-CM | POA: Diagnosis not present

## 2018-12-04 LAB — CUP PACEART REMOTE DEVICE CHECK
Date Time Interrogation Session: 20200705201000
Implantable Pulse Generator Implant Date: 20190503

## 2018-12-15 NOTE — Progress Notes (Signed)
Carelink Summary Report / Loop Recorder 

## 2019-01-05 ENCOUNTER — Ambulatory Visit (INDEPENDENT_AMBULATORY_CARE_PROVIDER_SITE_OTHER): Payer: BC Managed Care – PPO | Admitting: *Deleted

## 2019-01-05 DIAGNOSIS — R002 Palpitations: Secondary | ICD-10-CM | POA: Diagnosis not present

## 2019-01-07 LAB — CUP PACEART REMOTE DEVICE CHECK
Date Time Interrogation Session: 20200807213959
Implantable Pulse Generator Implant Date: 20190503

## 2019-01-09 NOTE — Progress Notes (Signed)
Carelink Summary Report / Loop Recorder 

## 2019-02-07 ENCOUNTER — Ambulatory Visit (INDEPENDENT_AMBULATORY_CARE_PROVIDER_SITE_OTHER): Payer: BC Managed Care – PPO | Admitting: *Deleted

## 2019-02-07 DIAGNOSIS — I4891 Unspecified atrial fibrillation: Secondary | ICD-10-CM | POA: Diagnosis not present

## 2019-02-07 DIAGNOSIS — R002 Palpitations: Secondary | ICD-10-CM

## 2019-02-08 LAB — CUP PACEART REMOTE DEVICE CHECK
Date Time Interrogation Session: 20200909213826
Implantable Pulse Generator Implant Date: 20190503

## 2019-02-14 ENCOUNTER — Telehealth: Payer: Self-pay | Admitting: Cardiology

## 2019-02-14 NOTE — Telephone Encounter (Signed)
Patient called to report that she sent a manual transmission w/ her home monitor for her ILR. She stated that she felt some palpitations, and racing heart beat, denies ShOb, no chest pain, no dizziness. She stated that she is a little flush in the face and her heart beat is still fast.

## 2019-02-14 NOTE — Telephone Encounter (Signed)
Transmission reviewed  ST, at 100 bpm. Patient reports she felt her heart "flip flop" when she pushed the symptom activator. Denies CP,SOB or syncope at time ov event.No sx at this time.

## 2019-02-22 NOTE — Progress Notes (Signed)
Carelink Summary Report / Loop Recorder 

## 2019-02-26 ENCOUNTER — Telehealth: Payer: Self-pay | Admitting: Student

## 2019-02-26 NOTE — Telephone Encounter (Signed)
Called patient for symptom episode 9/25 showing ST ~ 140.   She states she was walking out of the door from her home when she felt her heart "flip flopping" which is how she has previously described PVCs. She denies lightheadedness or dizziness and says it only lasted for a moment.   She denies caffeine intake, and has been taking magnesium as directed.   Pt will send manual transmission, then will forward to Dr. Caryl Comes to see if any change in treatment is warranted at this time.   Legrand Como [address] [place], PA-C  02/26/2019 1:03 PM

## 2019-02-26 NOTE — Telephone Encounter (Signed)
Transmission reviewed. 4 symptoms episodes, no tachy,brady,or pause episodes.  Episodes from 9/21 and 9/28 appear to show NSR with ? PACs simlar to previous.   Symptom episode 02/23/19 - Pt states she was getting ready to walk out the door from home when she felt palpitations.  Shows ST vs ? SVT.   Will forward to Dr. Caryl Comes.  Pt not on BB or CCB.

## 2019-03-03 NOTE — Telephone Encounter (Signed)
Can u print this out for me-- I cannot enlarge on the computer Thanks SK

## 2019-03-12 ENCOUNTER — Ambulatory Visit (INDEPENDENT_AMBULATORY_CARE_PROVIDER_SITE_OTHER): Payer: BC Managed Care – PPO | Admitting: *Deleted

## 2019-03-12 DIAGNOSIS — I4891 Unspecified atrial fibrillation: Secondary | ICD-10-CM | POA: Diagnosis not present

## 2019-03-13 LAB — CUP PACEART REMOTE DEVICE CHECK
Date Time Interrogation Session: 20201013070036
Implantable Pulse Generator Implant Date: 20190503

## 2019-03-22 NOTE — Progress Notes (Signed)
Carelink Summary Report / Loop Recorder 

## 2019-04-02 ENCOUNTER — Other Ambulatory Visit: Payer: Self-pay | Admitting: Obstetrics and Gynecology

## 2019-04-02 DIAGNOSIS — Z803 Family history of malignant neoplasm of breast: Secondary | ICD-10-CM

## 2019-04-14 LAB — CUP PACEART REMOTE DEVICE CHECK
Date Time Interrogation Session: 20201114200526
Implantable Pulse Generator Implant Date: 20190503

## 2019-04-16 ENCOUNTER — Ambulatory Visit (INDEPENDENT_AMBULATORY_CARE_PROVIDER_SITE_OTHER): Payer: BC Managed Care – PPO | Admitting: *Deleted

## 2019-04-16 DIAGNOSIS — I4891 Unspecified atrial fibrillation: Secondary | ICD-10-CM | POA: Diagnosis not present

## 2019-05-10 NOTE — Telephone Encounter (Signed)
Dr. Caryl Comes reviewed symptom episodes from 02/23/19 and 04/25/19. ECGs most likely indicate sinus tachycardia. No new orders at this time.

## 2019-05-14 NOTE — Progress Notes (Signed)
Carelink Summary Report / Loop Recorder 

## 2019-05-17 ENCOUNTER — Ambulatory Visit (INDEPENDENT_AMBULATORY_CARE_PROVIDER_SITE_OTHER): Payer: BC Managed Care – PPO | Admitting: *Deleted

## 2019-05-17 DIAGNOSIS — I4891 Unspecified atrial fibrillation: Secondary | ICD-10-CM

## 2019-05-18 LAB — CUP PACEART REMOTE DEVICE CHECK
Date Time Interrogation Session: 20201217180656
Implantable Pulse Generator Implant Date: 20190503

## 2019-06-06 NOTE — Progress Notes (Deleted)
Cardiology Office Note   Date:  06/06/2019   ID:  Erica Ho, DOB Mar 31, 1981, MRN [id number]  PCP:  Mikael Spray, NP  Cardiologist:   Rollene Rotunda, MD  Referring:  Mikael Spray, NP  No chief complaint on file.     History of Present Illness: Erica Ho is a 39 y.o. female who presents for evaluation of dizziness. She has had an extensive work up.  She has been seen by physicians at [facility], [place] and here.   She was seen by [facility] cardiology.  She had 30 day event monitor was found have PACs but no symptomatic dysrhythmias. There was sinus tachycardia noted. She did have an echocardiogram and it was normal.   At one point she had  loss of vision. She had an evaluation to include a CT which was essentially unremarkable. She was seeing neurology and had an MRI . In December 2017 she had shortness of breath and some chest discomfort and had a pulmonary embolism documented. She's been on anticoagulation. She saw cardiology after this and had a question of atrial fibrillation though no documentation. She wore an event monitor. She had a superficial thrombosis but was never been found to have a DVT. She's had no clotting disorders documented.   She has had chest pain and a negative POET (Plain Old Exercise Treadmill).  Work up at [facility] did not demonstrate an autonomic disorder.   She saw Dr. Graciela Husbands and had an event monitor placed.  At one point I reviewed 107 lines of this.  She had PVCs and PACs.  She has had a loop implanted and I have reviewed this and this is followed by Dr. Graciela Husbands.  She has SVT and and she had occasional 4 beat runs of SVT. I reviewed the monitor today for this and she had no atrial fib.  She was in the ED with superficial thrombophlebitis and her Xarelto was increased back to 20 mg.  It had been reduced secondary to menorrhagia.  ***  She says that she is having some increased uterine bleeding since going back on a higher dose but it is manageable.  She  actually feels relatively well.  The leg is not bothering her as much as it did when it was diagnosed acutely in November.  She is not really noticing the palpitations.  She notices them occasionally but she has not had any presyncope or syncope.  She has had no sustained dysrhythmias.  She has no chest pressure, neck or arm discomfort.  She has had no new shortness of breath, PND or orthopnea.   Past Medical History:  Diagnosis Date  . ABLATION SUPERFICAL VIENS LEFT LEG 11/2016  . Anemia    RESOLVED  . Anxiety   . Arthritis    LOWER BACK  . Blood dyscrasia   . Dysrhythmia    PVC, PAC, SYMPTOMATIC HOLTER MONITOR TWICE AND NOTHING SHOW UP  . Headache    OCULAR MIGRAINES  . Heart palpitations    a. 05/2016: event monitor showing PAC's with one episode of 4 beats NSVT  . History of migraine headaches   . PE (pulmonary thromboembolism) (HCC)    a. 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 Varicose veins     Past Surgical History:  Procedure Laterality Date  . CESAREAN SECTION    . CHOLECYSTECTOMY    . ENDOVENOUS ABLATION SAPHENOUS VEIN W/ LASER Left 12/15/2016   endovenous laser ablation L GSV and stab phlebectomy > 20 incisions  left leg by Tinnie Gens MD   . LOOP RECORDER INSERTION N/A 09/30/2017   Procedure: LOOP RECORDER INSERTION;  Surgeon: Deboraha Sprang, MD;  Location: [place] [facility];  Service: Cardiovascular;  Laterality: N/A;  . TONSILLECTOMY    . TUBAL LIGATION       Current Outpatient Medications  Medication Sig Dispense Refill  . acetaminophen (TYLENOL) 500 MG tablet Take 500-1,000 mg by mouth daily as needed for moderate pain or headache.     . clonazePAM (KLONOPIN) 0.5 MG tablet Take 0.5 mg by mouth 2 (two) times daily.     . ferrous sulfate 325 (65 FE) MG EC tablet Take 325 mg by mouth daily with breakfast.     . magnesium oxide (MAG-OX) 400 (241.3 Mg) MG tablet Take 1 tablet (400 mg total) by mouth daily.    . rivaroxaban (XARELTO) 20 MG TABS tablet Take 1 tablet (20  mg total) by mouth daily. 30 tablet 0   No current facility-administered medications for this visit.    Allergies:   Imitrex [sumatriptan], Cardizem [diltiazem], Isovue [iopamidol], Levofloxacin, and Nsaids    ROS:  Please see the history of present illness.   Otherwise, review of systems are positive for none.   All other systems are reviewed and negative.    PHYSICAL EXAM: VS:  There were no vitals taken for this visit. , BMI There is no height or weight on file to calculate BMI.  GENERAL:  Well appearing NECK:  No jugular venous distention, waveform within normal limits, carotid upstroke brisk and symmetric, no bruits, no thyromegaly LUNGS:  Clear to auscultation bilaterally CHEST:  Unremarkable HEART:  PMI not displaced or sustained,S1 and S2 within normal limits, no S3, no S4, no clicks, no rubs, *** murmurs ABD:  Flat, positive bowel sounds normal in frequency in pitch, no bruits, no rebound, no guarding, no midline pulsatile mass, no hepatomegaly, no splenomegaly EXT:  2 plus pulses throughout, no edema, no cyanosis no clubbing    ***GENERAL:  Well appearing NECK:  No jugular venous distention, waveform within normal limits, carotid upstroke brisk and symmetric, no bruits, no thyromegaly LUNGS:  Clear to auscultation bilaterally CHEST:  Unremarkable HEART:  PMI not displaced or sustained,S1 and S2 within normal limits, no S3, no S4, no clicks, no rubs, no murmurs ABD:  Flat, positive bowel sounds normal in frequency in pitch, no bruits, no rebound, no guarding, no midline pulsatile mass, no hepatomegaly, no splenomegaly EXT:  2 plus pulses throughout, no edema, no cyanosis no clubbing   EKG:  EKG is not ordered.    Recent Labs: No results found for requested labs within last 8760 hours.    Lipid Panel    Component Value Date/Time   CHOL 173 08/30/2016 0000   TRIG 46 08/30/2016 0000   HDL 68 08/30/2016 0000   LDLCALC 96 08/30/2016 0000      Wt Readings from Last  3 Encounters:  05/03/18 150 lb (68 kg)  12/19/17 145 lb (65.8 kg)  09/30/17 138 lb (62.6 kg)      Other studies Reviewed:  Additional studies/ records that were reviewed today include: ED records, loop recorder, oncology records.   (Extensive review) Review of the above records demonstrates:       ASSESSMENT AND PLAN:   THROMBOPHLEBITIS:    ***  She has a history of pulmonary embolism.  She is on Eliquis.  She was on Xarelto because she thought the Eliquis was causing palpitations but these  didn't improve so she went back to Xarelto.  She saw Dr. Cyndie Chime in July and it was decided to continue low dose Xareloto..  This is now increased back to full dose as above.    She is going to reduce back down to 10 mg in a couple of weeks.  I agree with this suggestion.  ARRHYTHMIA:   ***  She has had no symptomatic dysrhythmias since I last saw her.  I reviewed the records and the most recent event monitors.  There have been no sustained arrhythmias.  There is been no fibrillation.  No change in therapy.   CHEST PAIN:  *** This is atypical and she had a negative POET (Plain Old Exercise Treadmill).  No change in therapy.   COVID EDUCATION:  ***   Current medicines are reviewed at length with the patient today.  The patient does not have concerns regarding medicines.  The following changes have been made:  ***  Labs/ tests ordered today include:   ***  No orders of the defined types were placed in this encounter.    Disposition:   FU with me in *** months.   Signed, Rollene Rotunda, MD  06/06/2019 8:52 PM    [facility]

## 2019-06-07 ENCOUNTER — Ambulatory Visit: Payer: BC Managed Care – PPO | Admitting: Cardiology

## 2019-06-13 DIAGNOSIS — Z7189 Other specified counseling: Secondary | ICD-10-CM | POA: Insufficient documentation

## 2019-06-13 DIAGNOSIS — R072 Precordial pain: Secondary | ICD-10-CM | POA: Insufficient documentation

## 2019-06-13 NOTE — Progress Notes (Signed)
Cardiology Office Note   Date:  06/15/2019   ID:  Erica Ho, DOB [birth year], MRN [id number]  PCP:  Mikael Spray, NP  Cardiologist:   Rollene Rotunda, MD  Referring:  Mikael Spray, NP  Chief Complaint  Patient presents with  . PVCs      History of Present Illness: Erica Ho is a 39 y.o. female who presents for evaluation of dizziness. She has had an extensive work up.  She has been seen by physicians at [facility], [place] and here.   She was seen by [facility] cardiology.  She had 30 day event monitor was found have PACs but no symptomatic dysrhythmias. There was sinus tachycardia noted. She did have an echocardiogram and it was normal.   At one point she had  loss of vision. She had an evaluation to include a CT which was essentially unremarkable. She was seeing neurology and had an MRI . In December 2017 she had shortness of breath and some chest discomfort and had a pulmonary embolism documented. She's been on anticoagulation. She saw cardiology after this and had a question of atrial fibrillation though no documentation. She wore an event monitor. She had a superficial thrombosis but was never been found to have a DVT. She's had no clotting disorders documented.   She has had chest pain and a negative POET (Plain Old Exercise Treadmill).  Work up at [facility] did not demonstrate an autonomic disorder.   She saw Dr. Graciela Husbands and had an event monitor placed.  At one point I reviewed 107 lines of this.  She had PVCs and PACs.  She has had a loop implanted and I have reviewed this and this is followed by Dr. Graciela Husbands.  She has SVT and and she had occasional 4 beat runs of SVT. I reviewed the monitor today for this and she had no atrial fib.  She was in the ED with superficial thrombophlebitis and her Xarelto was increased back to 20 mg.  It had been reduced secondary to menorrhagia.    In December 2019 the last conversation with hematology was that she probably should go back down to the  10 mg Xarelto as it was thought that 20 mg would not prevent the thrombophlebitis.  She did see vascular surgery after that and there was no intervention needed.  She has had no further acute issues with her leg.  She has had no new chest pressure, neck or arm discomfort.  She gets some fleeting discomfort under her left breast.  She is physically active working in [employer].  She denies any new shortness of breath, PND or orthopnea.  She has occasional palpitations but she never tolerated beta-blockers or calcium channel blockers as her blood pressure went too low.   Past Medical History:  Diagnosis Date  . ABLATION SUPERFICAL VIENS LEFT LEG 11/2016  . Anemia    RESOLVED  . Anxiety   . Arthritis    LOWER BACK  . Blood dyscrasia   . Dysrhythmia    PVC, PAC, SYMPTOMATIC HOLTER MONITOR TWICE AND NOTHING SHOW UP  . Headache    OCULAR MIGRAINES  . Heart palpitations    a. 05/2016: event monitor showing PAC's with one episode of 4 beats NSVT  . History of migraine headaches   . PE (pulmonary thromboembolism) (HCC)    a. 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 Varicose veins     Past Surgical History:  Procedure Laterality Date  . CESAREAN SECTION    .  CHOLECYSTECTOMY    . ENDOVENOUS ABLATION SAPHENOUS VEIN W/ LASER Left 12/15/2016   endovenous laser ablation L GSV and stab phlebectomy > 20 incisions left leg by Josephina Gip MD   . LOOP RECORDER INSERTION N/A 09/30/2017   Procedure: LOOP RECORDER INSERTION;  Surgeon: Duke Salvia, MD;  Location: [facility] INVASIVE CV LAB;  Service: Cardiovascular;  Laterality: N/A;  . TONSILLECTOMY    . TUBAL LIGATION       Current Outpatient Medications  Medication Sig Dispense Refill  . acetaminophen (TYLENOL) 500 MG tablet Take 500-1,000 mg by mouth daily as needed for moderate pain or headache.     . clonazePAM (KLONOPIN) 0.5 MG tablet Take 0.5 mg by mouth 2 (two) times daily.     . ferrous sulfate 325 (65 FE) MG EC tablet Take 325 mg by mouth daily  with breakfast.     . magnesium oxide (MAG-OX) 400 (241.3 Mg) MG tablet Take 1 tablet (400 mg total) by mouth daily.    . rivaroxaban (XARELTO) 20 MG TABS tablet Take 1 tablet (20 mg total) by mouth daily. 30 tablet 0   No current facility-administered medications for this visit.    Allergies:   Imitrex [sumatriptan], Cardizem [diltiazem], Isovue [iopamidol], Levofloxacin, and Nsaids    ROS:  Please see the history of present illness.   Otherwise, review of systems are positive for none.   All other systems are reviewed and negative.    PHYSICAL EXAM: VS:  BP 106/76   Pulse 69   Ht 5' (1.524 m)   Wt 148 lb (67.1 kg)   BMI 28.90 kg/m  , BMI Body mass index is 28.9 kg/m.  GENERAL:  Well appearing NECK:  No jugular venous distention, waveform within normal limits, carotid upstroke brisk and symmetric, no bruits, no thyromegaly LUNGS:  Clear to auscultation bilaterally CHEST:  Unremarkable HEART:  PMI not displaced or sustained,S1 and S2 within normal limits, no S3, no S4, no clicks, no rubs, no murmurs ABD:  Flat, positive bowel sounds normal in frequency in pitch, no bruits, no rebound, no guarding, no midline pulsatile mass, no hepatomegaly, no splenomegaly EXT:  2 plus pulses throughout, no edema, no cyanosis no clubbing   EKG:  EKG is ordered.  Sinus rhythm, rate 69, axis within normal limits, intervals within normal limits, no acute ST-T wave changes.  Recent Labs: No results found for requested labs within last 8760 hours.    Lipid Panel    Component Value Date/Time   CHOL 173 08/30/2016 0000   TRIG 46 08/30/2016 0000   HDL 68 08/30/2016 0000   LDLCALC 96 08/30/2016 0000      Wt Readings from Last 3 Encounters:  06/15/19 148 lb (67.1 kg)  05/03/18 150 lb (68 kg)  12/19/17 145 lb (65.8 kg)      Other studies Reviewed:  Additional studies/ records that were reviewed today include:   Heme note 2019 Review of the above records demonstrates:    See  above   ASSESSMENT AND PLAN:   THROMBOPHLEBITIS:      I am going to have her see Dr. Candise Che.  This had previously been suggested but was interrupted with Covid.  I suspect he will want to reduce back down to 10 mg of the Xarelto. \  ARRHYTHMIA:   She has some infrequent PVCs but no further treatment is warranted and she will let me know if it ever gets worse.  CHEST PAIN: She has had some atypical chest  pain but had a negative POET (Plain Old Exercise Treadmill) a couple of years ago.  I think obstructive coronary disease is unlikely.  No further evaluation.   COVID EDUCATION: She would like to get the vaccine when able we talked about this today.  Current medicines are reviewed at length with the patient today.  The patient does not have concerns regarding medicines.  The following changes have been made:  None  Labs/ tests ordered today include:     Orders Placed This Encounter  Procedures  . CBC  . Basic Metabolic Panel (BMET)  . Ambulatory referral to Hematology  . EKG 12-Lead     Disposition:   FU with me in 18 months.   Signed, Minus Breeding, MD  06/15/2019 11:14 AM    [facility]

## 2019-06-15 ENCOUNTER — Ambulatory Visit: Payer: BC Managed Care – PPO | Admitting: Cardiology

## 2019-06-15 ENCOUNTER — Other Ambulatory Visit: Payer: Self-pay

## 2019-06-15 ENCOUNTER — Encounter: Payer: Self-pay | Admitting: Cardiology

## 2019-06-15 VITALS — BP 106/76 | HR 69 | Ht 60.0 in | Wt 148.0 lb

## 2019-06-15 DIAGNOSIS — I4891 Unspecified atrial fibrillation: Secondary | ICD-10-CM

## 2019-06-15 DIAGNOSIS — I809 Phlebitis and thrombophlebitis of unspecified site: Secondary | ICD-10-CM

## 2019-06-15 DIAGNOSIS — R072 Precordial pain: Secondary | ICD-10-CM | POA: Diagnosis not present

## 2019-06-15 DIAGNOSIS — Z7189 Other specified counseling: Secondary | ICD-10-CM | POA: Diagnosis not present

## 2019-06-15 LAB — CBC
Hematocrit: 33 % — ABNORMAL LOW (ref 34.0–46.6)
Hemoglobin: 10.5 g/dL — ABNORMAL LOW (ref 11.1–15.9)
MCH: 27.5 pg (ref 26.6–33.0)
MCHC: 31.8 g/dL (ref 31.5–35.7)
MCV: 86 fL (ref 79–97)
Platelets: 241 10*3/uL (ref 150–450)
RBC: 3.82 x10E6/uL (ref 3.77–5.28)
RDW: 12.7 % (ref 11.7–15.4)
WBC: 6 10*3/uL (ref 3.4–10.8)

## 2019-06-15 LAB — BASIC METABOLIC PANEL
BUN/Creatinine Ratio: 22 (ref 9–23)
BUN: 13 mg/dL (ref 6–24)
CO2: 24 mmol/L (ref 20–29)
Calcium: 9.4 mg/dL (ref 8.7–10.2)
Chloride: 103 mmol/L (ref 96–106)
Creatinine, Ser: 0.59 mg/dL (ref 0.57–1.00)
GFR calc Af Amer: 133 mL/min/{1.73_m2} (ref >59)
GFR calc non Af Amer: 115 mL/min/{1.73_m2} (ref >59)
Glucose: 81 mg/dL (ref 65–99)
Potassium: 4.5 mmol/L (ref 3.5–5.2)
Sodium: 138 mmol/L (ref 134–144)

## 2019-06-15 NOTE — Patient Instructions (Signed)
Medication Instructions:  No change *If you need a refill on your cardiac medications before your next appointment, please call your pharmacy*  Lab Work: Your physician recommends that you return for lab work today (BMP, CBC) If you have labs (blood work) drawn today and your tests are completely normal, you will receive your results only by: Marland Kitchen MyChart Message (if you have MyChart) OR . A paper copy in the mail If you have any lab test that is abnormal or we need to change your treatment, we will call you to review the results.  Testing/Procedures: None  Follow-Up: At [facility], you and your health needs are our priority.  As part of our continuing mission to provide you with exceptional heart care, we have created designated Provider Care Teams.  These Care Teams include your primary Cardiologist (physician) and Advanced Practice Providers (APPs -  Physician Assistants and Nurse Practitioners) who all work together to provide you with the care you need, when you need it.  Your next appointment:   18 month(s)  The format for your next appointment:   In Person  Provider:   Rollene Rotunda, MD  Other Instructions Referred to Dr. Candise Che for hematology

## 2019-06-19 ENCOUNTER — Ambulatory Visit (INDEPENDENT_AMBULATORY_CARE_PROVIDER_SITE_OTHER): Payer: BC Managed Care – PPO | Admitting: *Deleted

## 2019-06-19 DIAGNOSIS — I4891 Unspecified atrial fibrillation: Secondary | ICD-10-CM

## 2019-06-20 LAB — CUP PACEART REMOTE DEVICE CHECK
Date Time Interrogation Session: 20210119180750
Implantable Pulse Generator Implant Date: 20190503

## 2019-07-23 ENCOUNTER — Ambulatory Visit (INDEPENDENT_AMBULATORY_CARE_PROVIDER_SITE_OTHER): Payer: BC Managed Care – PPO | Admitting: *Deleted

## 2019-07-23 DIAGNOSIS — I4891 Unspecified atrial fibrillation: Secondary | ICD-10-CM | POA: Diagnosis not present

## 2019-07-23 LAB — CUP PACEART REMOTE DEVICE CHECK
Date Time Interrogation Session: 20210222003121
Implantable Pulse Generator Implant Date: 20190503

## 2019-07-24 NOTE — Progress Notes (Signed)
ILR Remote 

## 2019-08-23 ENCOUNTER — Ambulatory Visit (INDEPENDENT_AMBULATORY_CARE_PROVIDER_SITE_OTHER): Payer: BC Managed Care – PPO | Admitting: *Deleted

## 2019-08-23 DIAGNOSIS — I4891 Unspecified atrial fibrillation: Secondary | ICD-10-CM

## 2019-08-23 LAB — CUP PACEART REMOTE DEVICE CHECK
Date Time Interrogation Session: 20210325013344
Implantable Pulse Generator Implant Date: 20190503

## 2019-08-24 NOTE — Progress Notes (Signed)
ILR Remote 

## 2019-08-29 ENCOUNTER — Other Ambulatory Visit: Payer: Self-pay | Admitting: Internal Medicine

## 2019-09-24 ENCOUNTER — Ambulatory Visit (INDEPENDENT_AMBULATORY_CARE_PROVIDER_SITE_OTHER): Payer: BC Managed Care – PPO | Admitting: *Deleted

## 2019-09-24 DIAGNOSIS — I4891 Unspecified atrial fibrillation: Secondary | ICD-10-CM

## 2019-09-24 LAB — CUP PACEART REMOTE DEVICE CHECK
Date Time Interrogation Session: 20210425013257
Implantable Pulse Generator Implant Date: 20190503

## 2019-09-24 NOTE — Progress Notes (Signed)
ILR Remote 

## 2019-10-03 ENCOUNTER — Telehealth: Payer: Self-pay | Admitting: Hematology

## 2019-10-03 ENCOUNTER — Other Ambulatory Visit: Payer: BC Managed Care – PPO

## 2019-10-03 NOTE — Telephone Encounter (Signed)
Received a new hem referral from [facility] for thrombophlebitis. Pt has been cld and scheduled to see Dr. Candise Che on 5/13 at 1pm. Aware to arrive 15 minutes early.

## 2019-10-10 NOTE — Progress Notes (Signed)
HEMATOLOGY/ONCOLOGY CONSULTATION NOTE  Date of Service: 10/11/2019  Patient Care Team: Mikael Spray, NP as PCP - General (Gerontology) Rollene Rotunda, MD as Consulting Physician (Cardiology) Marcelle Overlie, MD as Consulting Physician (Obstetrics and Gynecology)  REFERRING PHYSICIAN: Mikael Spray, NP  CHIEF COMPLAINTS/PURPOSE OF CONSULTATION:  Thrombophlebitis    HISTORY OF PRESENTING ILLNESS:   Erica Ho is a wonderful 39 y.o. female who has been referred to [place] by Mikael Spray, NP for evaluation and management of Thrombophlebitis. The pt reports that she is doing well overall.   The pt reports she is good. Pt had another superficial clot in her leg and she was put back on 20mg  of Xarelto in 2019.  She has been using compression socks and there is still swelling. Pt's leg still changes colors.   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 Pt had several symptoms of SOB during the small clot in the lung. She has varicose veins. Pt has had superficial clots that were thought to be due to varicose veins. She has been put on magnesium to help with her heart.   Pt is still having heavy periods on 20mg  of Xarelto. She has not been on any hormone replacement. Pt was fatigued and PCP thought it was due to Vitamin B12 being low. When the Vitamin B12 was tested it was too high. The other thing the pt's iron was low. She was told to take 2 iron supplements daily.   Most recent lab results (06/15/19) of CBC is as follows: all values are WNL except for Hemoglobin at 10.5, Hematocrit at 33 12/20/2016 of Cardiolipin Antibodies, IgG, IgM, IgA is as follows: all values are WNL 12/20/2016 of Factor 5 Leiden at Negative 12/20/2016 of Prothrombin Gene Mutation of Negative 12/20/2016 of Antithrombin III at 127: WNL 12/20/2016 of Beta-2-Glycoprotein I Abs IgG/M/A is as follows: all values are WNL  12/20/2016 of D-dimer, quantitative at 0.53  On review of systems, pt denies  weight changes, calf pain, thigh pain and any other symptoms.   On PMHx the pt reports superficial venous thrombosis in 2019, varicose veins, PE in 2017  On Family Hx the pt reports several family members with Lupus   MEDICAL HISTORY:  Past Medical History:  Diagnosis Date  . ABLATION SUPERFICAL VIENS LEFT LEG 11/2016  . Anemia    RESOLVED  . Anxiety   . Arthritis    LOWER BACK  . Blood dyscrasia   . Dysrhythmia    PVC, PAC, SYMPTOMATIC HOLTER MONITOR TWICE AND NOTHING SHOW UP  . Headache    OCULAR MIGRAINES  . Heart palpitations    a. 05/2016: event monitor showing PAC's with one episode of 4 beats NSVT  . History of migraine headaches   . PE (pulmonary thromboembolism) (HCC)    a. 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 Varicose veins      SURGICAL HISTORY: Past Surgical History:  Procedure Laterality Date  . CESAREAN SECTION    . CHOLECYSTECTOMY    . ENDOVENOUS ABLATION SAPHENOUS VEIN W/ LASER Left 12/15/2016   endovenous laser ablation L GSV and stab phlebectomy > 20 incisions left leg by 07/2016 MD   . LOOP RECORDER INSERTION N/A 09/30/2017   Procedure: LOOP RECORDER INSERTION;  Surgeon: Josephina Gip, MD;  Location: [facility] INVASIVE CV LAB;  Service: Cardiovascular;  Laterality: N/A;  . TONSILLECTOMY    . TUBAL LIGATION       SOCIAL HISTORY: Social History   Socioeconomic  History  . Marital status: Married    Spouse name: Not on file  . Number of children: Not on file  . Years of education: Not on file  . Highest education level: Not on file  Occupational History  . Occupation: Conservation officer, nature  Tobacco Use  . Smoking status: Former Smoker    Quit date: 08/26/2011    Years since quitting: 8.1  . Smokeless tobacco: Never Used  Substance and Sexual Activity  . Alcohol use: No    Alcohol/week: 0.0 standard drinks  . Drug use: No  . Sexual activity: Yes    Partners: Male  Other Topics Concern  . Not on file  Social History Narrative  . Not on file   Social  Determinants of Health   Financial Resource Strain:   . Difficulty of Paying Living Expenses:   Food Insecurity:   . Worried About Programme researcher, broadcasting/film/video in the Last Year:   . Barista in the Last Year:   Transportation Needs:   . Freight forwarder (Medical):   Marland Kitchen Lack of Transportation (Non-Medical):   Physical Activity:   . Days of Exercise per Week:   . Minutes of Exercise per Session:   Stress:   . Feeling of Stress :   Social Connections:   . Frequency of Communication with Friends and Family:   . Frequency of Social Gatherings with Friends and Family:   . Attends Religious Services:   . Active Member of Clubs or Organizations:   . Attends Banker Meetings:   Marland Kitchen Marital Status:   Intimate Partner Violence:   . Fear of Current or Ex-Partner:   . Emotionally Abused:   Marland Kitchen Physically Abused:   . Sexually Abused:      FAMILY HISTORY: Family History  Problem Relation Age of Onset  . Diabetes Mother   . Clotting disorder Neg Hx      ALLERGIES:   is allergic to imitrex [sumatriptan]; cardizem [diltiazem]; isovue [iopamidol]; levofloxacin; and nsaids.   MEDICATIONS:  Current Outpatient Medications  Medication Sig Dispense Refill  . acetaminophen (TYLENOL) 500 MG tablet Take 500-1,000 mg by mouth daily as needed for moderate pain or headache.     . clonazePAM (KLONOPIN) 0.5 MG tablet Take 0.5 mg by mouth 2 (two) times daily.     . ferrous sulfate 325 (65 FE) MG EC tablet Take 325 mg by mouth daily with breakfast.     . magnesium oxide (MAG-OX) 400 (241.3 Mg) MG tablet Take 1 tablet (400 mg total) by mouth daily.    . rivaroxaban (XARELTO) 20 MG TABS tablet Take 1 tablet (20 mg total) by mouth daily. 30 tablet 0   No current facility-administered medications for this visit.     REVIEW OF SYSTEMS:   A 10+ POINT REVIEW OF SYSTEMS WAS OBTAINED including neurology, dermatology, psychiatry, cardiac, respiratory, lymph, extremities, GI, GU,  Musculoskeletal, constitutional, breasts, reproductive, HEENT.  All pertinent positives are noted in the HPI.  All others are negative.   PHYSICAL EXAMINATION: ECOG PERFORMANCE STATUS: 0 - Asymptomatic  Vitals:   10/11/19 1314  BP: 117/79  Pulse: 70  Resp: 18  Temp: 98.7 F (37.1 C)  SpO2: 100%   Filed Weights   10/11/19 1314  Weight: 148 lb 3.2 oz (67.2 kg)   Body mass index is 28.94 kg/m.  GENERAL:alert, in no acute distress and comfortable SKIN: no acute rashes, no significant lesions EYES: conjunctiva are pink and non-injected, sclera anicteric OROPHARYNX:  MMM, no exudates, no oropharyngeal erythema or ulceration NECK: supple, no JVD LYMPH:  no palpable lymphadenopathy in the cervical, axillary or inguinal regions LUNGS: clear to auscultation b/l with normal respiratory effort HEART: regular rate & rhythm ABDOMEN:  normoactive bowel sounds , non tender, not distended. Extremity: no pedal edema PSYCH: alert & oriented x 3 with fluent speech NEURO: no focal motor/sensory deficits  LABORATORY DATA:  I have reviewed the data as listed  CBC Latest Ref Rng & Units 10/11/2019 06/15/2019 12/19/2017  WBC 4.0 - 10.5 K/uL 6.1 6.0 4.6  Hemoglobin 12.0 - 15.0 g/dL 78.2 10.5(L) 13.8  Hematocrit 36.0 - 46.0 % 35.6(L) 33.0(L) 40.9  Platelets 150 - 400 K/uL 242 241 247    CMP Latest Ref Rng & Units 10/11/2019 06/15/2019 12/19/2017  Glucose 70 - 99 mg/dL 84 81 82  BUN 6 - 20 mg/dL 12 13 17   Creatinine 0.44 - 1.00 mg/dL 9.56 2.13  Sodium 135 - 145 mmol/L 140 138 138  Potassium 3.5 - 5.1 mmol/L 4.1 4.5 4.4  Chloride 98 - 111 mmol/L 103 103 99  CO2 22 - 32 mmol/L 29 24 23   Calcium 8.9 - 10.3 mg/dL 9.6 9.4 9.3  Total Protein 6.5 - 8.1 g/dL 7.1 - 7.1  Total Bilirubin 0.3 - 1.2 mg/dL 0.4 - 0.5  Alkaline Phos 38 - 126 U/L 52 - 36(L)  AST 15 - 41 U/L 21 - 15  ALT 0 - 44 U/L 13 - 11      RADIOGRAPHIC STUDIES: I have personally reviewed the radiological images as listed and  agreed with the findings in the report. CUP PACEART REMOTE DEVICE CHECK  Result Date: 09/24/2019 Carelink summary report received. Battery status OK. Normal device function. 4 symptom events show SR-ST. No tachy episodes, brady, or pause episodes. No new AF episodes. Monthly summary reports and ROV/PRN    ASSESSMENT & PLAN:   BRADEN DELOACH is a 38 y.o. female with:  1. Low volume single sub-segmental LLL Pulmonary embolism in Dec 2017  ? Unprovoked vs provoked (due to advanced varicose veins) No other overt finding of primary hypercoag state  Was rx with Xarlelto 20mg  po daily for >1 yr then transitioned to Xarelto 10mg  po daily  2. Superficial thrombophlebitis 2019 - switch back to Xarelto 20mg . Likely related to Varicose veins.  3. H/o Iron, B12 and Vit D deficiency PLAN: -Discussed patient's most recent labs from 06/15/19, Hemoglobin at 10.5, Hematocrit at 33 -Discussed 12/20/2016 of Cardiolipin Antibodies, IgG, IgM, IgA is as follows: all values are WNL -Discussed 12/20/2016 of Factor 5 Leiden at Negative -Discussed 12/20/2016 of Prothrombin Gene Mutation of Negative -Discussed 12/20/2016 of Antithrombin III at 127: WNL -Discussed 12/20/2016 of Beta-2-Glycoprotein I Abs IgG/M/A is as follows: all values are WNL  -Discussed 12/20/2016 of D-dimer, quantitative at 0.53 -Advised on varicose veins as a local risk factor for VTE -Advised on blood clotting disorders as risk factors -Advised on unprovoked vs provoked blood clot PE -Advised on preventive dose of Xarelto -Advised on symptoms of chronic venous insufficieny- risk reasonably controlled, chronic symptoms being stable, initial clot being large and life threatening, recurrent   -Advised on reasons for superficial clots -Advised oral iron supplement -heavy periods, not absorbing enough iron -Advised consideration of IV iron replacement if needed -Goal Ferritin <100 -Discussed local risk factors due to no clots in right  leg  -Discussed full dose blood thinners not necessarily recommended for superficial clots -Recommends compression socks  -Recommends 10mg  of  Xarelto for continude VTE prophylaxis -Will check Ferritin, Vitamin B12, Vitamin D levels today -RTC with Dr Irene Limbo with labs in 12 months  FOLLOW UP Labs today RTC with Dr Irene Limbo with labs in 12 months  The total time spent in the appt was 45 minutes and more than 50% was on counseling and direct patient cares.  All of the patient's questions were answered with apparent satisfaction. The patient knows to call the clinic with any problems, questions or concerns.   Sullivan Lone MD MS AAHIVMS [facility] Hematology/Oncology Physician [facility]  (Office):       [phone] (Work cell):  [phone] (Fax):           [phone]  10/11/2019 1:53 PM  I, Dawayne Cirri am acting as a Education administrator for Dr. Sullivan Lone.   .I have reviewed the above documentation for accuracy and completeness, and I agree with the above.

## 2019-10-11 ENCOUNTER — Inpatient Hospital Stay: Payer: BC Managed Care – PPO

## 2019-10-11 ENCOUNTER — Inpatient Hospital Stay: Payer: BC Managed Care – PPO | Attending: Hematology | Admitting: Hematology

## 2019-10-11 ENCOUNTER — Other Ambulatory Visit: Payer: Self-pay

## 2019-10-11 VITALS — BP 117/79 | HR 70 | Temp 98.7°F | Resp 18 | Ht 60.0 in | Wt 148.2 lb

## 2019-10-11 DIAGNOSIS — F419 Anxiety disorder, unspecified: Secondary | ICD-10-CM | POA: Diagnosis not present

## 2019-10-11 DIAGNOSIS — D509 Iron deficiency anemia, unspecified: Secondary | ICD-10-CM

## 2019-10-11 DIAGNOSIS — Z833 Family history of diabetes mellitus: Secondary | ICD-10-CM

## 2019-10-11 DIAGNOSIS — Z79899 Other long term (current) drug therapy: Secondary | ICD-10-CM | POA: Diagnosis not present

## 2019-10-11 DIAGNOSIS — I83892 Varicose veins of left lower extremities with other complications: Secondary | ICD-10-CM

## 2019-10-11 DIAGNOSIS — Z87891 Personal history of nicotine dependence: Secondary | ICD-10-CM | POA: Diagnosis not present

## 2019-10-11 DIAGNOSIS — E559 Vitamin D deficiency, unspecified: Secondary | ICD-10-CM

## 2019-10-11 DIAGNOSIS — I809 Phlebitis and thrombophlebitis of unspecified site: Secondary | ICD-10-CM

## 2019-10-11 DIAGNOSIS — E538 Deficiency of other specified B group vitamins: Secondary | ICD-10-CM

## 2019-10-11 DIAGNOSIS — Z86711 Personal history of pulmonary embolism: Secondary | ICD-10-CM

## 2019-10-11 DIAGNOSIS — Z7901 Long term (current) use of anticoagulants: Secondary | ICD-10-CM

## 2019-10-11 LAB — CBC WITH DIFFERENTIAL/PLATELET
Abs Immature Granulocytes: 0 10*3/uL (ref 0.00–0.07)
Basophils Absolute: 0.1 10*3/uL (ref 0.0–0.1)
Basophils Relative: 1 %
Eosinophils Absolute: 0.1 10*3/uL (ref 0.0–0.5)
Eosinophils Relative: 1 %
HCT: 35.6 % — ABNORMAL LOW (ref 36.0–46.0)
Hemoglobin: 12 g/dL (ref 12.0–15.0)
Immature Granulocytes: 0 %
Lymphocytes Relative: 32 %
Lymphs Abs: 2 10*3/uL (ref 0.7–4.0)
MCH: 29.8 pg (ref 26.0–34.0)
MCHC: 33.7 g/dL (ref 30.0–36.0)
MCV: 88.3 fL (ref 80.0–100.0)
Monocytes Absolute: 0.3 10*3/uL (ref 0.1–1.0)
Monocytes Relative: 6 %
Neutro Abs: 3.7 10*3/uL (ref 1.7–7.7)
Neutrophils Relative %: 60 %
Platelets: 242 10*3/uL (ref 150–400)
RBC: 4.03 MIL/uL (ref 3.87–5.11)
RDW: 12.8 % (ref 11.5–15.5)
WBC: 6.1 10*3/uL (ref 4.0–10.5)
nRBC: 0 % (ref 0.0–0.2)

## 2019-10-11 LAB — CMP (CANCER CENTER ONLY)
ALT: 13 U/L (ref 0–44)
AST: 21 U/L (ref 15–41)
Albumin: 3.9 g/dL (ref 3.5–5.0)
Alkaline Phosphatase: 52 U/L (ref 38–126)
Anion gap: 8 (ref 5–15)
BUN: 12 mg/dL (ref 6–20)
CO2: 29 mmol/L (ref 22–32)
Calcium: 9.6 mg/dL (ref 8.9–10.3)
Chloride: 103 mmol/L (ref 98–111)
Creatinine: 0.73 mg/dL (ref 0.44–1.00)
GFR, Est AFR Am: >60 mL/min (ref >60)
GFR, Estimated: >60 mL/min (ref >60)
Glucose, Bld: 84 mg/dL (ref 70–99)
Potassium: 4.1 mmol/L (ref 3.5–5.1)
Sodium: 140 mmol/L (ref 135–145)
Total Bilirubin: 0.4 mg/dL (ref 0.3–1.2)
Total Protein: 7.1 g/dL (ref 6.5–8.1)

## 2019-10-11 LAB — IRON AND TIBC
Iron: 186 ug/dL — ABNORMAL HIGH (ref 41–142)
Saturation Ratios: 54 % (ref 21–57)
TIBC: 343 ug/dL (ref 236–444)
UIBC: 156 ug/dL (ref 120–384)

## 2019-10-11 LAB — VITAMIN D 25 HYDROXY (VIT D DEFICIENCY, FRACTURES): Vit D, 25-Hydroxy: 24.62 ng/mL — ABNORMAL LOW (ref 30–100)

## 2019-10-11 LAB — VITAMIN B12: Vitamin B-12: 506 pg/mL (ref 180–914)

## 2019-10-11 LAB — FERRITIN: Ferritin: 10 ng/mL — ABNORMAL LOW (ref 11–307)

## 2019-10-15 ENCOUNTER — Telehealth: Payer: Self-pay | Admitting: Hematology

## 2019-10-15 NOTE — Telephone Encounter (Signed)
Scheduled per los, patient has been called and notified. 

## 2019-10-24 ENCOUNTER — Ambulatory Visit (INDEPENDENT_AMBULATORY_CARE_PROVIDER_SITE_OTHER): Payer: BC Managed Care – PPO | Admitting: *Deleted

## 2019-10-24 DIAGNOSIS — R002 Palpitations: Secondary | ICD-10-CM

## 2019-10-24 LAB — CUP PACEART REMOTE DEVICE CHECK
Date Time Interrogation Session: 20210526014725
Implantable Pulse Generator Implant Date: 20190503

## 2019-10-24 NOTE — Progress Notes (Signed)
Carelink Summary Report / Loop Recorder 

## 2019-11-23 ENCOUNTER — Other Ambulatory Visit: Payer: Self-pay | Admitting: Hematology

## 2019-11-23 ENCOUNTER — Telehealth: Payer: Self-pay | Admitting: *Deleted

## 2019-11-23 MED ORDER — RIVAROXABAN 10 MG PO TABS: 10.0000 mg | ORAL_TABLET | Freq: Every day | ORAL | 2 refills | Status: DC

## 2019-11-23 MED ORDER — VITAMIN D 25 MCG (1000 UNIT) PO TABS: 2000.0000 [IU] | ORAL_TABLET | Freq: Every day | ORAL | Status: DC

## 2019-11-23 MED ORDER — POLYSACCHARIDE IRON COMPLEX 150 MG PO CAPS
150.0000 mg | ORAL_CAPSULE | Freq: Every day | ORAL | 4 refills | Status: DC
Start: 2019-11-23 — End: 2021-08-13

## 2019-11-23 NOTE — Telephone Encounter (Signed)
Patient called - she said at last appt you decreased her Xarelto from 20 mg/day to 10 mg/day, but the pharmacy did not receive a new RX. She thought she would have enough medicine to last until she saw PCP next week, but only has 3 pills left. Dr.Kale informed. Contacted patient with MD response: sent prescription for Xarelto 10mg  po daily. Hgb is better @ 12 but still quite iron deficienct ferritin 10-- sent presscription for iron polysaccharide 150mg  po daily. Vit D def -- recommend taking VitD2000 units daily po OTC.  Patient verbalized understanding of prescriptions and recommendations

## 2019-11-26 ENCOUNTER — Ambulatory Visit (INDEPENDENT_AMBULATORY_CARE_PROVIDER_SITE_OTHER): Payer: BC Managed Care – PPO | Admitting: *Deleted

## 2019-11-26 DIAGNOSIS — R002 Palpitations: Secondary | ICD-10-CM

## 2019-11-26 LAB — CUP PACEART REMOTE DEVICE CHECK
Date Time Interrogation Session: 20210628025402
Implantable Pulse Generator Implant Date: 20190503

## 2019-11-27 NOTE — Progress Notes (Signed)
Carelink Summary Report / Loop Recorder 

## 2019-12-31 ENCOUNTER — Ambulatory Visit (INDEPENDENT_AMBULATORY_CARE_PROVIDER_SITE_OTHER): Payer: BC Managed Care – PPO | Admitting: *Deleted

## 2019-12-31 DIAGNOSIS — I4891 Unspecified atrial fibrillation: Secondary | ICD-10-CM

## 2020-01-01 LAB — CUP PACEART REMOTE DEVICE CHECK
Date Time Interrogation Session: 20210731030457
Implantable Pulse Generator Implant Date: 20190503

## 2020-01-02 NOTE — Progress Notes (Signed)
Carelink Summary Report / Loop Recorder 

## 2020-02-03 LAB — CUP PACEART REMOTE DEVICE CHECK
Date Time Interrogation Session: 20210902030630
Implantable Pulse Generator Implant Date: 20190503

## 2020-02-05 ENCOUNTER — Ambulatory Visit (INDEPENDENT_AMBULATORY_CARE_PROVIDER_SITE_OTHER): Payer: BC Managed Care – PPO | Admitting: *Deleted

## 2020-02-05 DIAGNOSIS — R002 Palpitations: Secondary | ICD-10-CM | POA: Diagnosis not present

## 2020-02-07 NOTE — Progress Notes (Signed)
Carelink Summary Report / Loop Recorder 

## 2020-02-18 ENCOUNTER — Other Ambulatory Visit: Payer: Self-pay | Admitting: Hematology

## 2020-03-07 LAB — CUP PACEART REMOTE DEVICE CHECK
Date Time Interrogation Session: 20211005030909
Implantable Pulse Generator Implant Date: 20190503

## 2020-03-10 ENCOUNTER — Other Ambulatory Visit: Payer: Self-pay

## 2020-03-10 ENCOUNTER — Ambulatory Visit (INDEPENDENT_AMBULATORY_CARE_PROVIDER_SITE_OTHER): Payer: BC Managed Care – PPO

## 2020-03-10 ENCOUNTER — Telehealth: Payer: Self-pay | Admitting: Physician Assistant

## 2020-03-10 ENCOUNTER — Ambulatory Visit: Payer: BC Managed Care – PPO | Admitting: Physician Assistant

## 2020-03-10 ENCOUNTER — Encounter: Payer: Self-pay | Admitting: Physician Assistant

## 2020-03-10 VITALS — BP 108/72 | HR 77 | Ht 60.0 in | Wt 151.0 lb

## 2020-03-10 DIAGNOSIS — R002 Palpitations: Secondary | ICD-10-CM

## 2020-03-10 DIAGNOSIS — I809 Phlebitis and thrombophlebitis of unspecified site: Secondary | ICD-10-CM | POA: Diagnosis not present

## 2020-03-10 DIAGNOSIS — R072 Precordial pain: Secondary | ICD-10-CM

## 2020-03-10 DIAGNOSIS — Z86711 Personal history of pulmonary embolism: Secondary | ICD-10-CM | POA: Diagnosis not present

## 2020-03-10 DIAGNOSIS — Z7901 Long term (current) use of anticoagulants: Secondary | ICD-10-CM

## 2020-03-10 NOTE — Progress Notes (Signed)
Cardiology Office Note:    Date:  03/10/2020   ID:  Erica Ho, DOB [birth year], MRN [id number]  PCP:  Adela Glimpse, NP  Cardiologist:  Rollene Rotunda, MD   Referring MD: Mikael Spray, NP   Chief Complaint  Patient presents with  . Follow-up  . Chest Pain    Left side.    History of Present Illness:    Erica Ho is a 39 y.o. female with a hx of atrial flutter/fib, hx of PE, GERD, and PACs. She initially presented with dizziness following extensive workup at [facility], [facility], [facility], and [facility]. She was seen at [facility] cardiology and had 30 day event monitor that showed PACs and no significant arrhythmia. CT for loss of vision was unremarkable. Due to chest pain and dyspnea, CTA performed and showed PE, placed on anticoagulation. Negative POET for chest pain. She has had several heart monitors and ILR that has not shown Afib. She follows with Dr. Graciela Husbands in EP. Xarelto reduced due to menorrhagia, but ED visit with thrombophlebitis prompted increase back to 20 mg. In 2019, hematology recommended reduction back to 10 mg. She does have occasional palpitations but has not tolerated BB or CCB.   She called our office today reported left sided chest pain and was added to my schedule today. She was positive for COVID-19 on 02/18/20.  She is back at work, but continues to cough nearly constantly during the visit. Left lower ribs started aching 2 weeks ago, chest pain in her upper left chest started 2 days ago. CP occurs with rest or activity, can be somewhat worse with exertion, but she is unsure of any associated symptoms. Right lung fields hurt when she has deep inspiration. She had a clear CXR early last week, no PNA. She did complete a 7-day coarse of doxycycline for bronchitis with the COVID-19 infection. No relieving or aggravating factors, can occur a few times per day. CP rated as a 1-2/10.    Past Medical History:  Diagnosis Date  . ABLATION SUPERFICAL VIENS LEFT LEG 11/2016    . Anemia    RESOLVED  . Anxiety   . Arthritis    LOWER BACK  . Blood dyscrasia   . Dysrhythmia    PVC, PAC, SYMPTOMATIC HOLTER MONITOR TWICE AND NOTHING SHOW UP  . Headache    OCULAR MIGRAINES  . Heart palpitations    a. 05/2016: event monitor showing PAC's with one episode of 4 beats NSVT  . History of migraine headaches   . PE (pulmonary thromboembolism) (HCC)    a. 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 Varicose veins     Past Surgical History:  Procedure Laterality Date  . CESAREAN SECTION    . CHOLECYSTECTOMY    . ENDOVENOUS ABLATION SAPHENOUS VEIN W/ LASER Left 12/15/2016   endovenous laser ablation L GSV and stab phlebectomy > 20 incisions left leg by Josephina Gip MD   . LOOP RECORDER INSERTION N/A 09/30/2017   Procedure: LOOP RECORDER INSERTION;  Surgeon: Wylie Coon Salvia, MD;  Location: [facility] INVASIVE CV LAB;  Service: Cardiovascular;  Laterality: N/A;  . TONSILLECTOMY    . TUBAL LIGATION      Current Medications: Current Meds  Medication Sig  . acetaminophen (TYLENOL) 500 MG tablet Take 500-1,000 mg by mouth daily as needed for moderate pain or headache.   . cholecalciferol (VITAMIN D3) 25 MCG (1000 UNIT) tablet Take 2 tablets (2,000 Units total) by mouth daily.  . clonazePAM (KLONOPIN) 0.5 MG tablet Take  0.5 mg by mouth 2 (two) times daily.   . ferrous sulfate 325 (65 FE) MG EC tablet Take 325 mg by mouth daily with breakfast.   . iron polysaccharides (NIFEREX) 150 MG capsule Take 1 capsule (150 mg total) by mouth daily.  . magnesium oxide (MAG-OX) 400 (241.3 Mg) MG tablet Take 1 tablet (400 mg total) by mouth daily.  Carlena Hurl 10 MG TABS tablet TAKE 1 TABLET BY MOUTH EVERY DAY     Allergies:   Imitrex [sumatriptan], Cardizem [diltiazem], Isovue [iopamidol], Levofloxacin, and Nsaids   Social History   Socioeconomic History  . Marital status: Married    Spouse name: Not on file  . Number of children: Not on file  . Years of education: Not on file  . Highest  education level: Not on file  Occupational History  . Occupation: Conservation officer, nature  Tobacco Use  . Smoking status: Former Smoker    Quit date: 08/26/2011    Years since quitting: 8.5  . Smokeless tobacco: Never Used  Vaping Use  . Vaping Use: Never used  Substance and Sexual Activity  . Alcohol use: No    Alcohol/week: 0.0 standard drinks  . Drug use: No  . Sexual activity: Yes    Partners: Male  Other Topics Concern  . Not on file  Social History Narrative  . Not on file   Social Determinants of Health   Financial Resource Strain:   . Difficulty of Paying Living Expenses: Not on file  Food Insecurity:   . Worried About Programme researcher, broadcasting/film/video in the Last Year: Not on file  . Ran Out of Food in the Last Year: Not on file  Transportation Needs:   . Lack of Transportation (Medical): Not on file  . Lack of Transportation (Non-Medical): Not on file  Physical Activity:   . Days of Exercise per Week: Not on file  . Minutes of Exercise per Session: Not on file  Stress:   . Feeling of Stress : Not on file  Social Connections:   . Frequency of Communication with Friends and Family: Not on file  . Frequency of Social Gatherings with Friends and Family: Not on file  . Attends Religious Services: Not on file  . Active Member of Clubs or Organizations: Not on file  . Attends Banker Meetings: Not on file  . Marital Status: Not on file     Family History: The patient's family history includes Diabetes in her mother. There is no history of Clotting disorder.  ROS:   Please see the history of present illness.     All other systems reviewed and are negative.  EKGs/Labs/Other Studies Reviewed:    The following studies were reviewed today:  CTA 2018 reviewed  EKG:  EKG is ordered today.  The ekg ordered today demonstrates sinus rhythm HR 77  Recent Labs: 10/11/2019: ALT 13; BUN 12; Creatinine 0.73; Hemoglobin 12.0; Platelets 242; Potassium 4.1; Sodium 140  Recent Lipid  Panel    Component Value Date/Time   CHOL 173 08/30/2016 0000   TRIG 46 08/30/2016 0000   HDL 68 08/30/2016 0000   LDLCALC 96 08/30/2016 0000    Physical Exam:    VS:  BP 108/72 (BP Location: Left Arm, Patient Position: Sitting, Cuff Size: Normal)   Pulse 77   Ht 5' (1.524 m)   Wt 151 lb (68.5 kg)   BMI 29.49 kg/m     Wt Readings from Last 3 Encounters:  03/10/20 151 lb (  68.5 kg)  10/11/19 148 lb 3.2 oz (67.2 kg)  06/15/19 148 lb (67.1 kg)     GEN: Well nourished, well developed in no acute distress HEENT: Normal NECK: No JVD; No carotid bruits LYMPHATICS: No lymphadenopathy CARDIAC: RRR, no murmurs, rubs, gallops RESPIRATORY:  Clear to auscultation without rales, wheezing or rhonchi - near constant coughing throughout visit ABDOMEN: Soft, non-tender, non-distended MUSCULOSKELETAL:  No edema; No deformity  SKIN: Warm and dry NEUROLOGIC:  Alert and oriented x 3 PSYCHIATRIC:  Normal affect   ASSESSMENT:    1. Precordial chest pain   2. History of pulmonary embolism   3. Thrombophlebitis   4. Chronic anticoagulation   5. Palpitations    PLAN:    In order of problems listed above:  Chest pain - history of negative POET - has history of atypical chest pain, but also PE - she has developed some atypical chest pain that occurs with activity or rest, several times per day, and started after coughing with COVID infection - rated as a 1/10 - will obtain echocardiogram first, we discussed nuclear stress test, but she wants to hold off on this - question costochondritis given nearly constant coughing for the last 3 weeks   Hx of PE Hx of thrombophlebitis Chronic anticoagulation - 10 mg xarelto - PE unprovoked - she is not tachycardic and does not feel like she did with her PE - low suspicion for recurrence, although COVID infection does put her at increase risk - she has not missed doses of xarelto   Palpitations - PVC - has not tolerated BB or CCB - stable -  last link download normal   Follow up with Dr. Antoine Poche in 1 month.   Medication Adjustments/Labs and Tests Ordered: Current medicines are reviewed at length with the patient today.  Concerns regarding medicines are outlined above.  Orders Placed This Encounter  Procedures  . EKG 12-Lead  . ECHOCARDIOGRAM COMPLETE   No orders of the defined types were placed in this encounter.   Signed, Marcelino Duster, PA  03/10/2020 12:21 PM    [facility]

## 2020-03-10 NOTE — Telephone Encounter (Signed)
Patient called about left chest pain after covid. Patient is free of covid and back at work. Wanted to see doctor or assistant asap due to chest pain. Scheduled appt with Erica Ho for this morning, 03/10/20 at 10:45 am

## 2020-03-10 NOTE — Patient Instructions (Signed)
Medication Instructions:  Continue current medications  *If you need a refill on your cardiac medications before your next appointment, please call your pharmacy*   Lab Work: None Ordered   Testing/Procedures: Your physician has requested that you have an echocardiogram. Echocardiography is a painless test that uses sound waves to create images of your heart. It provides your doctor with information about the size and shape of your heart and how well your heart's chambers and valves are working. This procedure takes approximately one hour. There are no restrictions for this procedure.   Follow-Up: At [facility], you and your health needs are our priority.  As part of our continuing mission to provide you with exceptional heart care, we have created designated Provider Care Teams.  These Care Teams include your primary Cardiologist (physician) and Advanced Practice Providers (APPs -  Physician Assistants and Nurse Practitioners) who all work together to provide you with the care you need, when you need it.  We recommend signing up for the patient portal called "MyChart".  Sign up information is provided on this After Visit Summary.  MyChart is used to connect with patients for Virtual Visits (Telemedicine).  Patients are able to view lab/test results, encounter notes, upcoming appointments, etc.  Non-urgent messages can be sent to your provider as well.   To learn more about what you can do with MyChart, go to [email or web address].    Your next appointment:   1 month(s)  The format for your next appointment:   In Person  Provider:   You may see [name], MD or one of the following Advanced Practice Providers on your designated Care Team:    Rhonda Barrett, PA-C  Kathryn Lawrence, DNP, ANP      

## 2020-03-11 NOTE — Progress Notes (Signed)
Carelink Summary Report / Loop Recorder 

## 2020-03-24 ENCOUNTER — Ambulatory Visit (HOSPITAL_COMMUNITY): Payer: BC Managed Care – PPO | Attending: Cardiology

## 2020-03-24 ENCOUNTER — Other Ambulatory Visit: Payer: Self-pay

## 2020-03-24 DIAGNOSIS — R072 Precordial pain: Secondary | ICD-10-CM | POA: Insufficient documentation

## 2020-03-24 LAB — ECHOCARDIOGRAM COMPLETE
Area-P 1/2: 3.16 cm2
P 1/2 time: 371 msec
S' Lateral: 2.9 cm

## 2020-04-06 LAB — CUP PACEART REMOTE DEVICE CHECK
Date Time Interrogation Session: 20211107022935
Implantable Pulse Generator Implant Date: 20190503

## 2020-04-14 ENCOUNTER — Ambulatory Visit (INDEPENDENT_AMBULATORY_CARE_PROVIDER_SITE_OTHER): Payer: BC Managed Care – PPO

## 2020-04-14 DIAGNOSIS — R002 Palpitations: Secondary | ICD-10-CM

## 2020-04-16 NOTE — Progress Notes (Signed)
Carelink Summary Report / Loop Recorder 

## 2020-04-17 NOTE — Progress Notes (Signed)
Cardiology Office Note   Date:  04/18/2020   ID:  Erica Ho, DOB 1981-03-01, MRN [id number]  PCP:  Adela Glimpse, NP  Cardiologist:   Rollene Rotunda, MD  Referring:  Adela Glimpse, NP  No chief complaint on file.     History of Present Illness: Erica Ho is a 39 y.o. female who presents for evaluation of dizziness. She has had an extensive work up.  She has been seen by physicians at [facility], [place] and here.   She was seen by [facility] cardiology.  She had 30 day event monitor was found have PACs but no symptomatic dysrhythmias. There was sinus tachycardia noted. She did have an echocardiogram and it was normal.   At one point she had  loss of vision. She had an evaluation to include a CT which was essentially unremarkable. She was seeing neurology and had an MRI . In December 2017 she had shortness of breath and some chest discomfort and had a pulmonary embolism documented. She's been on anticoagulation. She saw cardiology after this and had a question of atrial fibrillation though no documentation. She wore an event monitor. She had a superficial thrombosis but was never been found to have a DVT. She's had no clotting disorders documented.   She has had chest pain and a negative POET (Plain Old Exercise Treadmill).  Work up at [facility] did not demonstrate an autonomic disorder.   She saw Dr. Graciela Husbands and had an event monitor placed.  At one point I reviewed 107 lines of this.  She had PVCs and PACs.  She has had a loop implanted and I have reviewed this on 11/7 for this meeting.  She has had no arrhythmias.  She had chest pain at the last visit and this was thought to be non anginal.  Echo follow up demonstrated no significant abnormalities.    She had Covid since I last saw her.  She was fairly sick with this clinic she could not get immunoglobulin.  She was not hospitalized.  She is worried because she is having some intermittent chest discomfort under the left axilla  occasionally.  She did have a follow-up echocardiogram and was found to have mild to moderate aortic insufficiency, mild mitral and tricuspid regurgitation.  She had no pericardial effusion.  She had normal systolic function.  She continues to have some palpitations and I did review the monitor.  She had PVCs that are symptomatic but no sustained arrhythmias.  She has had no presyncope or syncope.   Past Medical History:  Diagnosis Date  . ABLATION SUPERFICAL VIENS LEFT LEG 11/2016  . Anemia    RESOLVED  . Anxiety   . Arthritis    LOWER BACK  . Blood dyscrasia   . Dysrhythmia    PVC, PAC, SYMPTOMATIC HOLTER MONITOR TWICE AND NOTHING SHOW UP  . Headache    OCULAR MIGRAINES  . Heart palpitations    a. 05/2016: event monitor showing PAC's with one episode of 4 beats NSVT  . History of migraine headaches   . PE (pulmonary thromboembolism) (HCC)    a. 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 Varicose veins     Past Surgical History:  Procedure Laterality Date  . CESAREAN SECTION    . CHOLECYSTECTOMY    . ENDOVENOUS ABLATION SAPHENOUS VEIN W/ LASER Left 12/15/2016   endovenous laser ablation L GSV and stab phlebectomy > 20 incisions left leg by Josephina Gip MD   . LOOP RECORDER INSERTION  N/A 09/30/2017   Procedure: LOOP RECORDER INSERTION;  Surgeon: Duke Salvia, MD;  Location: [facility] INVASIVE CV LAB;  Service: Cardiovascular;  Laterality: N/A;  . TONSILLECTOMY    . TUBAL LIGATION       Current Outpatient Medications  Medication Sig Dispense Refill  . acetaminophen (TYLENOL) 500 MG tablet Take 500-1,000 mg by mouth daily as needed for moderate pain or headache.     . clonazePAM (KLONOPIN) 0.5 MG tablet Take 0.5 mg by mouth 2 (two) times daily.     . ferrous sulfate 325 (65 FE) MG EC tablet Take 325 mg by mouth daily with breakfast.     . fluticasone (FLONASE) 50 MCG/ACT nasal spray Place into both nostrils.    . iron polysaccharides (NIFEREX) 150 MG capsule Take 1 capsule (150 mg total) by  mouth daily. 30 capsule 4  . magnesium oxide (MAG-OX) 400 (241.3 Mg) MG tablet Take 1 tablet (400 mg total) by mouth daily.    Carlena Hurl 10 MG TABS tablet TAKE 1 TABLET BY MOUTH EVERY DAY 30 tablet 2   No current facility-administered medications for this visit.    Allergies:   Imitrex [sumatriptan], Cardizem [diltiazem], Isovue [iopamidol], Levofloxacin, Nsaids, and Tolmetin    ROS:  Please see the history of present illness.   Otherwise, review of systems are positive for none.   All other systems are reviewed and negative.    PHYSICAL EXAM: VS:  BP 122/82   Pulse 62   Ht 5\' 1"  (1.549 m)   Wt 154 lb (69.9 kg)   BMI 29.10 kg/m  , BMI Body mass index is 29.1 kg/m.  GENERAL:  Well appearing NECK:  No jugular venous distention, waveform within normal limits, carotid upstroke brisk and symmetric, no bruits, no thyromegaly LUNGS:  Clear to auscultation bilaterally CHEST:  Unremarkable HEART:  PMI not displaced or sustained,S1 and S2 within normal limits, no S3, no S4, no clicks, no rubs, soft left upper sternal border brief systolic murmur heard with patient seated forward, no diastolic murmurs ABD:  Flat, positive bowel sounds normal in frequency in pitch, no bruits, no rebound, no guarding, no midline pulsatile mass, no hepatomegaly, no splenomegaly EXT:  2 plus pulses throughout, no edema, no cyanosis no clubbing   EKG:  EKG is not ordered.  For any  Recent Labs: 10/11/2019: ALT 13; BUN 12; Creatinine 0.73; Hemoglobin 12.0; Platelets 242; Potassium 4.1; Sodium 140    Lipid Panel    Component Value Date/Time   CHOL 173 08/30/2016 0000   TRIG 46 08/30/2016 0000   HDL 68 08/30/2016 0000   LDLCALC 96 08/30/2016 0000      Wt Readings from Last 3 Encounters:  04/18/20 154 lb (69.9 kg)  03/10/20 151 lb (68.5 kg)  10/11/19 148 lb 3.2 oz (67.2 kg)      Other studies Reviewed:  Additional studies/ records that were reviewed today include:   Echo and device  interrogation Review of the above records demonstrates:    See above   ASSESSMENT AND PLAN:   THROMBOPHLEBITIS:   She is seen by Dr. 10/13/19.  He has reduced her Xarelto to 10 mg daily.  He will continue to follow her.    ARRHYTHMIA:   She has had no significant arrhythmias.  No change in therapy.   CHEST PAIN:   She has had an extensive work up and has had no ischemic etiology.  No further work up.   AI:  This is moderate.  I will follow this with an echo in October.    Current medicines are reviewed at length with the patient today.  The patient does not have concerns regarding medicines.  The following changes have been made:  None  Labs/ tests ordered today include:     Orders Placed This Encounter  Procedures  . ECHOCARDIOGRAM COMPLETE     Disposition:   FU with me in 12 months.   Signed, Rollene Rotunda, MD  04/18/2020 5:12 PM    [facility]

## 2020-04-18 ENCOUNTER — Encounter: Payer: Self-pay | Admitting: Cardiology

## 2020-04-18 ENCOUNTER — Ambulatory Visit: Payer: BC Managed Care – PPO | Admitting: Cardiology

## 2020-04-18 ENCOUNTER — Other Ambulatory Visit: Payer: Self-pay

## 2020-04-18 VITALS — BP 122/82 | HR 62 | Ht 61.0 in | Wt 154.0 lb

## 2020-04-18 DIAGNOSIS — I351 Nonrheumatic aortic (valve) insufficiency: Secondary | ICD-10-CM

## 2020-04-18 DIAGNOSIS — R079 Chest pain, unspecified: Secondary | ICD-10-CM

## 2020-04-18 DIAGNOSIS — R002 Palpitations: Secondary | ICD-10-CM

## 2020-04-18 NOTE — Patient Instructions (Addendum)
Medication Instructions:  No changes *If you need a refill on your cardiac medications before your next appointment, please call your pharmacy*  Lab Work: None ordered this visit  Testing/Procedures: Your physician has requested that you have an echocardiogram in October. Echocardiography is a painless test that uses sound waves to create images of your heart. It provides your doctor with information about the size and shape of your heart and how well your heart's chambers and valves are working. This procedure takes approximately one hour. There are no restrictions for this procedure. [address]  Follow-Up: At [facility], you and your health needs are our priority.  As part of our continuing mission to provide you with exceptional heart care, we have created designated Provider Care Teams.  These Care Teams include your primary Cardiologist (physician) and Advanced Practice Providers (APPs -  Physician Assistants and Nurse Practitioners) who all work together to provide you with the care you need, when you need it.  Your next appointment:   After Echo - approximately 12 months  The format for your next appointment:   In Person  Provider:   Rollene Rotunda, MD

## 2020-05-12 ENCOUNTER — Other Ambulatory Visit: Payer: Self-pay | Admitting: Hematology

## 2020-05-12 NOTE — Telephone Encounter (Signed)
Please review for refill.  

## 2020-05-19 ENCOUNTER — Ambulatory Visit (INDEPENDENT_AMBULATORY_CARE_PROVIDER_SITE_OTHER): Payer: BC Managed Care – PPO

## 2020-05-19 DIAGNOSIS — R002 Palpitations: Secondary | ICD-10-CM

## 2020-05-19 LAB — CUP PACEART REMOTE DEVICE CHECK
Date Time Interrogation Session: 20211218232813
Implantable Pulse Generator Implant Date: 20190503

## 2020-06-03 NOTE — Progress Notes (Signed)
Carelink Summary Report / Loop Recorder 

## 2020-06-22 LAB — CUP PACEART REMOTE DEVICE CHECK
Date Time Interrogation Session: 20220121005535
Implantable Pulse Generator Implant Date: 20190503

## 2020-06-23 ENCOUNTER — Ambulatory Visit (INDEPENDENT_AMBULATORY_CARE_PROVIDER_SITE_OTHER): Payer: BC Managed Care – PPO

## 2020-06-23 DIAGNOSIS — R002 Palpitations: Secondary | ICD-10-CM

## 2020-07-03 NOTE — Progress Notes (Signed)
Carelink Summary Report / Loop Recorder 

## 2020-07-26 LAB — CUP PACEART REMOTE DEVICE CHECK
Date Time Interrogation Session: 20220223021114
Implantable Pulse Generator Implant Date: 20190503

## 2020-07-28 ENCOUNTER — Ambulatory Visit (INDEPENDENT_AMBULATORY_CARE_PROVIDER_SITE_OTHER): Payer: BC Managed Care – PPO

## 2020-07-28 DIAGNOSIS — R002 Palpitations: Secondary | ICD-10-CM

## 2020-08-04 NOTE — Progress Notes (Signed)
Carelink Summary Report / Loop Recorder 

## 2020-08-19 ENCOUNTER — Other Ambulatory Visit: Payer: Self-pay | Admitting: Hematology

## 2020-08-25 ENCOUNTER — Ambulatory Visit (INDEPENDENT_AMBULATORY_CARE_PROVIDER_SITE_OTHER): Payer: BC Managed Care – PPO

## 2020-08-25 DIAGNOSIS — R002 Palpitations: Secondary | ICD-10-CM | POA: Diagnosis not present

## 2020-08-25 LAB — CUP PACEART REMOTE DEVICE CHECK
Date Time Interrogation Session: 20220328031358
Implantable Pulse Generator Implant Date: 20190503

## 2020-09-02 ENCOUNTER — Encounter (HOSPITAL_BASED_OUTPATIENT_CLINIC_OR_DEPARTMENT_OTHER): Payer: Self-pay | Admitting: Obstetrics and Gynecology

## 2020-09-02 NOTE — H&P (Signed)
40 year old female here for surgical management of menorrhagia  She is scheduled for D and C, Hysteroscopy and HTA  Past Medical History:  Diagnosis Date  . ABLATION SUPERFICAL VIENS LEFT LEG 11/2016  . Anemia    RESOLVED  . Anxiety   . Arthritis    LOWER BACK  . Blood dyscrasia   . Dysrhythmia    PVC, PAC, SYMPTOMATIC HOLTER MONITOR TWICE AND NOTHING SHOW UP  . Headache    OCULAR MIGRAINES  . Heart palpitations    a. 05/2016: event monitor showing PAC's with one episode of 4 beats NSVT  . History of migraine headaches   . PE (pulmonary thromboembolism) (HCC)    a. 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 Varicose veins    Past Surgical History:  Procedure Laterality Date  . CESAREAN SECTION    . CHOLECYSTECTOMY    . ENDOVENOUS ABLATION SAPHENOUS VEIN W/ LASER Left 12/15/2016   endovenous laser ablation L GSV and stab phlebectomy > 20 incisions left leg by Josephina Gip MD   . LOOP RECORDER INSERTION N/A 09/30/2017   Procedure: LOOP RECORDER INSERTION;  Surgeon: Duke Salvia, MD;  Location: [facility] INVASIVE CV LAB;  Service: Cardiovascular;  Laterality: N/A;  . TONSILLECTOMY    . TUBAL LIGATION     Prior to Admission medications   Medication Sig Start Date End Date Taking? Authorizing Provider  acetaminophen (TYLENOL) 500 MG tablet Take 500-1,000 mg by mouth daily as needed for moderate pain or headache.     [provider]  clonazePAM (KLONOPIN) 0.5 MG tablet Take 0.5 mg by mouth 2 (two) times daily.  01/03/17   [provider]  ferrous sulfate 325 (65 FE) MG EC tablet Take 325 mg by mouth daily with breakfast.     [provider]  fluticasone (FLONASE) 50 MCG/ACT nasal spray Place into both nostrils. 04/04/20   [provider]  iron polysaccharides (NIFEREX) 150 MG capsule Take 1 capsule (150 mg total) by mouth daily. 11/23/19   Johney Maine, MD  magnesium oxide (MAG-OX) 400 (241.3 Mg) MG tablet Take 1 tablet (400 mg total) by mouth daily.  08/04/18   Duke Salvia, MD  XARELTO 10 MG TABS tablet TAKE 1 TABLET BY MOUTH EVERY DAY 08/20/20   Johney Maine, MD   Imitrex [sumatriptan], Cardizem [diltiazem], Isovue [iopamidol], Levofloxacin, Nsaids, and Tolmetin Family History  Problem Relation Age of Onset  . Diabetes Mother   . Clotting disorder Neg Hx    Social History   Socioeconomic History  . Marital status: Married    Spouse name: Not on file  . Number of children: Not on file  . Years of education: Not on file  . Highest education level: Not on file  Occupational History  . Occupation: Conservation officer, nature  Tobacco Use  . Smoking status: Former Smoker    Quit date: 08/26/2011    Years since quitting: 9.0  . Smokeless tobacco: Never Used  Vaping Use  . Vaping Use: Never used  Substance and Sexual Activity  . Alcohol use: No    Alcohol/week: 0.0 standard drinks  . Drug use: No  . Sexual activity: Yes    Partners: Male  Other Topics Concern  . Not on file  Social History Narrative  . Not on file   Social Determinants of Health   Financial Resource Strain: Not on file  Food Insecurity: Not on file  Transportation Needs: Not on file  Physical Activity: Not on file  Stress: Not on file  Social Connections: Not on file   There were no vitals taken for this visit. General alert and oriented Lung CTAB Car RRR Abdomen is soft and non tender Pelvic WNL  IMPRESSION: Menorrhagia  PLAN: D and C, Hysteroscopy and HTA Risks reviewed Consent signed

## 2020-09-05 ENCOUNTER — Other Ambulatory Visit (HOSPITAL_COMMUNITY): Payer: BC Managed Care – PPO

## 2020-09-08 NOTE — Progress Notes (Signed)
Carelink Summary Report / Loop Recorder 

## 2020-09-09 ENCOUNTER — Encounter (HOSPITAL_BASED_OUTPATIENT_CLINIC_OR_DEPARTMENT_OTHER): Admission: RE | Payer: Self-pay | Source: Home / Self Care

## 2020-09-09 ENCOUNTER — Ambulatory Visit (HOSPITAL_BASED_OUTPATIENT_CLINIC_OR_DEPARTMENT_OTHER)
Admission: RE | Admit: 2020-09-09 | Payer: BC Managed Care – PPO | Source: Home / Self Care | Admitting: Obstetrics and Gynecology

## 2020-09-09 SURGERY — DILATATION & CURETTAGE/HYSTEROSCOPY WITH HYDROTHERMAL ABLATION
Anesthesia: Choice

## 2020-09-17 ENCOUNTER — Telehealth: Payer: Self-pay | Admitting: Hematology

## 2020-09-17 NOTE — Telephone Encounter (Signed)
Called pt to r/s appts per 4/29 sch msg. No answer. Left msg for pt to call back to r/s.  

## 2020-09-17 NOTE — Telephone Encounter (Signed)
Pt called back, r/s appts to pt's availability. Pt aware of updated appt date and time.

## 2020-09-22 ENCOUNTER — Telehealth: Payer: Self-pay

## 2020-09-22 NOTE — Telephone Encounter (Signed)
The patient states he heart rate was 190 bpm yesterday. She tried to send a transmission but got an error code. The monitor did send an automatic transmission. I told her I will see if there is a nurse available to speak with her. I let her speak with Amy, rn.

## 2020-09-22 NOTE — Telephone Encounter (Signed)
Spoke with pt, she reports her heart was racing yesterday, she was SOB prior to event.  Currently she denies any cardiac symptoms.    She used a pulse ox at time of event which registered HR up to 190s.  Pt reports she did use symptoms activator at the time, she tried to send manual transmission but received an error message.    Transmission was received, no sustained arrythmias noted.

## 2020-09-29 ENCOUNTER — Ambulatory Visit (INDEPENDENT_AMBULATORY_CARE_PROVIDER_SITE_OTHER): Payer: BC Managed Care – PPO

## 2020-09-29 DIAGNOSIS — R002 Palpitations: Secondary | ICD-10-CM | POA: Diagnosis not present

## 2020-09-30 LAB — CUP PACEART REMOTE DEVICE CHECK
Date Time Interrogation Session: 20220430031430
Implantable Pulse Generator Implant Date: 20190503

## 2020-10-10 ENCOUNTER — Ambulatory Visit: Payer: BC Managed Care – PPO | Admitting: Hematology

## 2020-10-10 ENCOUNTER — Other Ambulatory Visit: Payer: BC Managed Care – PPO

## 2020-10-21 NOTE — Progress Notes (Signed)
Carelink Summary Report / Loop Recorder 

## 2020-10-23 ENCOUNTER — Encounter: Payer: Self-pay | Admitting: Hematology

## 2020-11-02 LAB — CUP PACEART REMOTE DEVICE CHECK
Date Time Interrogation Session: 20220602031602
Implantable Pulse Generator Implant Date: 20190503

## 2020-11-03 ENCOUNTER — Ambulatory Visit (INDEPENDENT_AMBULATORY_CARE_PROVIDER_SITE_OTHER): Payer: BC Managed Care – PPO

## 2020-11-03 DIAGNOSIS — R002 Palpitations: Secondary | ICD-10-CM | POA: Diagnosis not present

## 2020-11-16 ENCOUNTER — Other Ambulatory Visit: Payer: Self-pay | Admitting: Hematology

## 2020-11-25 NOTE — Progress Notes (Signed)
Carelink Summary Report / Loop Recorder 

## 2020-12-01 NOTE — Progress Notes (Signed)
HEMATOLOGY/ONCOLOGY CONSULTATION NOTE  Date of Service: 12/02/2020  Patient Care Team: Adela Glimpseranford, Tonya, NP as PCP - General (Family Medicine) Rollene RotundaHochrein, James, MD as PCP - Cardiology (Cardiology) Rollene RotundaHochrein, James, MD as Consulting Physician (Cardiology) Marcelle OverlieGrewal, Michelle, MD as Consulting Physician (Obstetrics and Gynecology)  REFERRING PHYSICIAN: Mikael SprayMcRae, Lauren, NP  CHIEF COMPLAINTS/PURPOSE OF CONSULTATION:  Thrombophlebitis    HISTORY OF PRESENTING ILLNESS:   Erica Ho is a wonderful 40 y.o. female who has been referred to us by Mikael SprayMcRae, Lauren, NP for evaluation and management of Thrombophlebitis. The pt reports that she is doing well overall.   The pt reports she is good. Pt had another superficial clot in her leg and she was put back on 20mg  of Xarelto in 2019.  She has been using compression socks and there is still swelling. Pt's leg still changes colors.   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 Pt had several symptoms of SOB during the small clot in the lung. She has varicose veins. Pt has had superficial clots that were thought to be due to varicose veins. She has been put on magnesium to help with her heart.   Pt is still having heavy periods on 20mg  of Xarelto. She has not been on any hormone replacement. Pt was fatigued and PCP thought it was due to Vitamin B12 being low. When the Vitamin B12 was tested it was too high. The other thing the pt's iron was low. She was told to take 2 iron supplements daily.   Most recent lab results (06/15/19) of CBC is as follows: all values are WNL except for Hemoglobin at 10.5, Hematocrit at 33 12/20/2016 of Cardiolipin Antibodies, IgG, IgM, IgA is as follows: all values are WNL 12/20/2016 of Factor 5 Leiden at Negative 12/20/2016 of Prothrombin Gene Mutation of Negative 12/20/2016 of Antithrombin III at 127: WNL 12/20/2016 of Beta-2-Glycoprotein I Abs IgG/M/A is as follows: all values are WNL  12/20/2016 of D-dimer,  quantitative at 0.53  On review of systems, pt denies weight changes, calf pain, thigh pain and any other symptoms.   On PMHx the pt reports superficial venous thrombosis in 2019, varicose veins, PE in 2017  On Family Hx the pt reports several family members with Lupus.  INTERVAL HISTORY:  Erica Erica Ho is a wonderful 40 y.o. female who is here for f/u for thrombophlebitis. The patient's last visit with us was on 10/11/2019. The pt reports that she is doing well overall.  The pt reports that she had COVID at the end of September last year and is still experiencing SOB from this. She notes they told her that her D-Dimer was elevated slightly. She has not had any new leg swelling or clots. She was confused regarding the findings of the CT of a nodule on the spleen. The patient notes she still experiences continued PACs and PVCs. They tried a blood pressure medication, but this did not work and they have since stopped it. She uses the Flonase prn, not very often as of recently. She takes 325 mg Ferrous Sulfate daily and is tolerating this well.  The pt has had CT Ang[facility]ography Chest w contrast [facility], which revealed "FINDINGS: Cardiovascular: This is a technically satisfactory study. No pulmonary emboli are identified. Heart size is normal. No thoracic aortic aneurysm or pericardial effusion. Mediastinum/Nodes: No enlarged mediastinal, hilar, or axillary lymph nodes. Thyroid gland, trachea, and esophagus demonstrate no significant findings. Lungs/Pleura: No airspace disease, consolidation, suspicious  nodule, mass, pleural effusion or pneumothorax., A 3 mm RIGHT LOWER lobe nodule (series 6: Image 71) and 3 mm lingular nodule (6:79) are unchanged from 2017. Upper Abdomen: No acute abnormality. A stable 1.3 cm hypodense lesion within the INFERIOR spleen (4:155) sign changed from 2017. Musculoskeletal: No acute or suspicious bony abnormalities. IMPRESSION: No evidence of acute  abnormality. No evidence of pulmonary emboli"  Lab results today 12/02/2020 of CBC w/diff and CMP is as follows: all values are WNL. 12/02/2020 Vit D 25 Hydroxy pending. 12/02/2020 Vitamin B12 pending. 12/02/2020 Iron pending. 12/02/2020 Ferritin pending  On review of systems, pt reports continued SOB, and denies leg swelling, bleeding issues, and any other symptoms.   MEDICAL HISTORY:  Past Medical History:  Diagnosis Date   ABLATION SUPERFICAL VIENS LEFT LEG 11/2016   Anemia    RESOLVED   Anxiety    Arthritis    LOWER BACK   Blood dyscrasia    Headache    OCULAR MIGRAINES   Heart palpitations    a. 05/2016: event monitor showing PAC's with one episode of 4 beats NSVT   History of migraine headaches    PE (pulmonary thromboembolism) (HCC)    a. 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   Varicose veins      SURGICAL HISTORY: Past Surgical History:  Procedure Laterality Date   CESAREAN SECTION     CHOLECYSTECTOMY     ENDOVENOUS ABLATION SAPHENOUS VEIN W/ LASER Left 12/15/2016   endovenous laser ablation L GSV and stab phlebectomy > 20 incisions left leg by Josephina Gip MD    LOOP RECORDER INSERTION N/A 09/30/2017   Procedure: LOOP RECORDER INSERTION;  Surgeon: Duke Salvia, MD;  Location: [facility] INVASIVE CV LAB;  Service: Cardiovascular;  Laterality: N/A;   TONSILLECTOMY     TUBAL LIGATION       SOCIAL HISTORY: Social History   Socioeconomic History   Marital status: Married    Spouse name: Not on file   Number of children: Not on file   Years of education: Not on file   Highest education level: Not on file  Occupational History   Occupation: Conservation officer, nature  Tobacco Use   Smoking status: Former    Pack years: 0.00    Types: Cigarettes    Quit date: 08/26/2011    Years since quitting: 9.2   Smokeless tobacco: Never  Vaping Use   Vaping Use: Never used  Substance and Sexual Activity   Alcohol use: No    Alcohol/week: 0.0 standard drinks   Drug use: No   Sexual activity:  Yes    Partners: Male  Other Topics Concern   Not on file  Social History Narrative   Not on file   Social Determinants of Health   Financial Resource Strain: Not on file  Food Insecurity: Not on file  Transportation Needs: Not on file  Physical Activity: Not on file  Stress: Not on file  Social Connections: Not on file  Intimate Partner Violence: Not on file     FAMILY HISTORY: Family History  Problem Relation Age of Onset   Diabetes Mother    Clotting disorder Neg Hx      ALLERGIES:   is allergic to imitrex [sumatriptan], cardizem [diltiazem], isovue [iopamidol], levofloxacin, nsaids, and tolmetin.   MEDICATIONS:  Current Outpatient Medications  Medication Sig Dispense Refill   acetaminophen (TYLENOL) 500 MG tablet Take 500-1,000 mg by mouth daily as needed for moderate pain or headache.      clonazePAM (KLONOPIN) 0.5  MG tablet Take 0.5 mg by mouth 2 (two) times daily.      ferrous sulfate 325 (65 FE) MG EC tablet Take 325 mg by mouth daily with breakfast.      fluticasone (FLONASE) 50 MCG/ACT nasal spray Place into both nostrils.     iron polysaccharides (NIFEREX) 150 MG capsule Take 1 capsule (150 mg total) by mouth daily. 30 capsule 4   magnesium oxide (MAG-OX) 400 (241.3 Mg) MG tablet Take 1 tablet (400 mg total) by mouth daily.     XARELTO 10 MG TABS tablet TAKE 1 TABLET BY MOUTH EVERY DAY 30 tablet 2   No current facility-administered medications for this visit.     REVIEW OF SYSTEMS:   10 Point review of Systems was done is negative except as noted above.   PHYSICAL EXAMINATION: ECOG PERFORMANCE STATUS: 1 - Symptomatic but completely ambulatory  There were no vitals filed for this visit.  There were no vitals filed for this visit.  There is no height or weight on file to calculate BMI.  NAD. GENERAL:alert, in no acute distress and comfortable SKIN: no acute rashes, no significant lesions EYES: conjunctiva are pink and non-injected, sclera  anicteric OROPHARYNX: MMM, no exudates, no oropharyngeal erythema or ulceration NECK: supple, no JVD LYMPH:  no palpable lymphadenopathy in the cervical, axillary or inguinal regions LUNGS: clear to auscultation b/l with normal respiratory effort HEART: regular rate & rhythm ABDOMEN:  normoactive bowel sounds , non tender, not distended. Extremity: no pedal edema PSYCH: alert & oriented x 3 with fluent speech NEURO: no focal motor/sensory deficits  LABORATORY DATA:  I have reviewed the data as listed  CBC Latest Ref Rng & Units 12/02/2020 10/11/2019 06/15/2019  WBC 4.0 - 10.5 K/uL 5.2 6.1 6.0  Hemoglobin 12.0 - 15.0 g/dL 12.1 97.5 10.5(L)  Hematocrit 36.0 - 46.0 % 37.1 35.6(L) 33.0(L)  Platelets 150 - 400 K/uL 239 242 241    CMP Latest Ref Rng & Units 10/11/2019 06/15/2019 12/19/2017  Glucose 70 - 99 mg/dL 84 81 82  BUN 6 - 20 mg/dL 12 13 17   Creatinine 0.44 - 1.00 mg/dL 8.83 2.54  Sodium 135 - 145 mmol/L 140 138 138  Potassium 3.5 - 5.1 mmol/L 4.1 4.5 4.4  Chloride 98 - 111 mmol/L 103 103 99  CO2 22 - 32 mmol/L 29 24 23   Calcium 8.9 - 10.3 mg/dL 9.6 9.4 9.3  Total Protein 6.5 - 8.1 g/dL 7.1 - 7.1  Total Bilirubin 0.3 - 1.2 mg/dL 0.4 - 0.5  Alkaline Phos 38 - 126 U/L 52 - 36(L)  AST 15 - 41 U/L 21 - 15  ALT 0 - 44 U/L 13 - 11      RADIOGRAPHIC STUDIES: I have personally reviewed the radiological images as listed and agreed with the findings in the report. No results found.    ASSESSMENT & PLAN:   ALESSA MAZUR is a 40 y.o. female with:  Low volume single sub-segmental LLL Pulmonary embolism in Dec 2017  ? Unprovoked vs provoked (due to advanced varicose veins) No other overt finding of primary hypercoag state  Was rx with Xarlelto 20mg  po daily for >1 yr then transitioned to Xarelto 10mg  po daily  2. Superficial thrombophlebitis 2019 - switch back to Xarelto 20mg . Likely related to Varicose veins.  3. H/o Iron, B12 and Vit D deficiency   PLAN: -Discussed  pt labwork today, 12/02/2020; blood counts completely normal, chemistries completely normal, other labs pending. -Advised pt the  finding (lung nodules) could be benign and inflammatory. We can get a repeat scan with PCP in around six months from May and observe if any changes in this. -The patient notes her PCP will order the repeat scan to be done around November for the spleenic lesion. -Goal Ferritin > 100. -Recommends compression socks  -Continue 10mg  of Xarelto for continude VTE prophylaxis -Will send in Rx for 90 days supply of Xarelto at this time. -Discussed consolidation of care. The patient notes her PCP will be able to now assume care. -Will see back as needed.   FOLLOW UP: F/u with PCP RTC with Dr as needed    The total time spent in the appointment was 20 minutes and more than 50% was on counseling and direct patient cares.  All of the patient's questions were answered with apparent satisfaction. The patient knows to call the clinic with any problems, questions or concerns.   Candise Che MD MS AAHIVMS [facility] Hematology/Oncology Physician [facility]  (Office):       [phone] (Work cell):  [phone] (Fax):           [phone]  12/02/2020 9:33 AM   I, 02/02/2021, am acting as scribe for Dr. Minda Meo, MD.    .I have reviewed the above documentation for accuracy and completeness, and I agree with the above. Wyvonnia Lora MD   ADDENDUM  Component     Latest Ref Rng & Units 12/02/2020  Iron     41 - 142 ug/dL 02/02/2021  TIBC     144 - 818 ug/dL 563  Saturation Ratios     21 - 57 % 37  UIBC     120 - 384 ug/dL 149  Ferritin     11 - 307 ng/mL 17  Vitamin D, 25-Hydroxy     30 - 100 ng/mL 32.32  Vitamin B12     180 - 914 pg/mL 343   Ferritin 10--->17  PLAN -continue ferrous sulfate -- may increase to 1 tab po twice daily with food. -vit D 2000 units a day

## 2020-12-02 ENCOUNTER — Other Ambulatory Visit: Payer: Self-pay | Admitting: *Deleted

## 2020-12-02 ENCOUNTER — Inpatient Hospital Stay: Payer: BC Managed Care – PPO

## 2020-12-02 ENCOUNTER — Other Ambulatory Visit: Payer: Self-pay

## 2020-12-02 ENCOUNTER — Inpatient Hospital Stay: Payer: BC Managed Care – PPO | Attending: Hematology | Admitting: Hematology

## 2020-12-02 VITALS — BP 113/81 | HR 71 | Temp 98.2°F | Resp 17 | Wt 164.2 lb

## 2020-12-02 DIAGNOSIS — Z86711 Personal history of pulmonary embolism: Secondary | ICD-10-CM

## 2020-12-02 DIAGNOSIS — E559 Vitamin D deficiency, unspecified: Secondary | ICD-10-CM

## 2020-12-02 DIAGNOSIS — D509 Iron deficiency anemia, unspecified: Secondary | ICD-10-CM

## 2020-12-02 DIAGNOSIS — N920 Excessive and frequent menstruation with regular cycle: Secondary | ICD-10-CM | POA: Diagnosis not present

## 2020-12-02 DIAGNOSIS — Z7901 Long term (current) use of anticoagulants: Secondary | ICD-10-CM | POA: Insufficient documentation

## 2020-12-02 DIAGNOSIS — Z79899 Other long term (current) drug therapy: Secondary | ICD-10-CM | POA: Insufficient documentation

## 2020-12-02 DIAGNOSIS — I809 Phlebitis and thrombophlebitis of unspecified site: Secondary | ICD-10-CM | POA: Diagnosis present

## 2020-12-02 DIAGNOSIS — Z87891 Personal history of nicotine dependence: Secondary | ICD-10-CM | POA: Diagnosis not present

## 2020-12-02 LAB — CMP (CANCER CENTER ONLY)
ALT: 14 U/L (ref 0–44)
AST: 18 U/L (ref 15–41)
Albumin: 3.7 g/dL (ref 3.5–5.0)
Alkaline Phosphatase: 53 U/L (ref 38–126)
Anion gap: 8 (ref 5–15)
BUN: 8 mg/dL (ref 6–20)
CO2: 29 mmol/L (ref 22–32)
Calcium: 9.2 mg/dL (ref 8.9–10.3)
Chloride: 105 mmol/L (ref 98–111)
Creatinine: 0.74 mg/dL (ref 0.44–1.00)
GFR, Estimated: >60 mL/min (ref >60)
Glucose, Bld: 79 mg/dL (ref 70–99)
Potassium: 4.1 mmol/L (ref 3.5–5.1)
Sodium: 142 mmol/L (ref 135–145)
Total Bilirubin: 0.6 mg/dL (ref 0.3–1.2)
Total Protein: 7.2 g/dL (ref 6.5–8.1)

## 2020-12-02 LAB — CBC WITH DIFFERENTIAL (CANCER CENTER ONLY)
Abs Immature Granulocytes: 0.01 10*3/uL (ref 0.00–0.07)
Basophils Absolute: 0.1 10*3/uL (ref 0.0–0.1)
Basophils Relative: 1 %
Eosinophils Absolute: 0 10*3/uL (ref 0.0–0.5)
Eosinophils Relative: 1 %
HCT: 37.1 % (ref 36.0–46.0)
Hemoglobin: 12.6 g/dL (ref 12.0–15.0)
Immature Granulocytes: 0 %
Lymphocytes Relative: 29 %
Lymphs Abs: 1.5 10*3/uL (ref 0.7–4.0)
MCH: 30.4 pg (ref 26.0–34.0)
MCHC: 34 g/dL (ref 30.0–36.0)
MCV: 89.6 fL (ref 80.0–100.0)
Monocytes Absolute: 0.4 10*3/uL (ref 0.1–1.0)
Monocytes Relative: 7 %
Neutro Abs: 3.2 10*3/uL (ref 1.7–7.7)
Neutrophils Relative %: 62 %
Platelet Count: 239 10*3/uL (ref 150–400)
RBC: 4.14 MIL/uL (ref 3.87–5.11)
RDW: 12.3 % (ref 11.5–15.5)
WBC Count: 5.2 10*3/uL (ref 4.0–10.5)
nRBC: 0 % (ref 0.0–0.2)

## 2020-12-02 LAB — FERRITIN: Ferritin: 17 ng/mL (ref 11–307)

## 2020-12-02 LAB — VITAMIN D 25 HYDROXY (VIT D DEFICIENCY, FRACTURES): Vit D, 25-Hydroxy: 32.32 ng/mL (ref 30–100)

## 2020-12-02 LAB — IRON AND TIBC
Iron: 123 ug/dL (ref 41–142)
Saturation Ratios: 37 % (ref 21–57)
TIBC: 333 ug/dL (ref 236–444)
UIBC: 209 ug/dL (ref 120–384)

## 2020-12-02 LAB — VITAMIN B12: Vitamin B-12: 343 pg/mL (ref 180–914)

## 2020-12-02 MED ORDER — DIPHENHYDRAMINE HCL 12.5 MG/5ML PO ELIX
ORAL_SOLUTION | ORAL | Status: AC
  Filled 2020-12-02: qty 5

## 2020-12-02 MED ORDER — FAMOTIDINE 20 MG IN NS 100 ML IVPB
INTRAVENOUS | Status: AC
  Filled 2020-12-02: qty 100

## 2020-12-02 MED ORDER — ACETAMINOPHEN 325 MG PO TABS
ORAL_TABLET | ORAL | Status: AC
  Filled 2020-12-02: qty 2

## 2020-12-08 ENCOUNTER — Ambulatory Visit (INDEPENDENT_AMBULATORY_CARE_PROVIDER_SITE_OTHER): Payer: BC Managed Care – PPO

## 2020-12-08 DIAGNOSIS — R002 Palpitations: Secondary | ICD-10-CM | POA: Diagnosis not present

## 2020-12-09 LAB — CUP PACEART REMOTE DEVICE CHECK
Date Time Interrogation Session: 20220705031651
Implantable Pulse Generator Implant Date: 20190503

## 2020-12-11 NOTE — Progress Notes (Signed)
Pt contacted per Dr Candise Che:  let patient know ferritin improved from 10 to 17 but still low -continue ferrous sulfate -- may increase to 1 tab po twice daily with food. -vit D 2000units a day. Continue f/u with PCP   Pt acknowledged and verbalized understanding.

## 2020-12-19 ENCOUNTER — Other Ambulatory Visit: Payer: Self-pay

## 2020-12-19 DIAGNOSIS — D509 Iron deficiency anemia, unspecified: Secondary | ICD-10-CM

## 2020-12-19 MED ORDER — RIVAROXABAN 10 MG PO TABS: 10.0000 mg | ORAL_TABLET | Freq: Every day | ORAL | 0 refills | Status: DC

## 2020-12-30 NOTE — Progress Notes (Signed)
Carelink Summary Report / Loop Recorder 

## 2021-01-12 ENCOUNTER — Ambulatory Visit (INDEPENDENT_AMBULATORY_CARE_PROVIDER_SITE_OTHER): Payer: BC Managed Care – PPO

## 2021-01-12 DIAGNOSIS — R002 Palpitations: Secondary | ICD-10-CM | POA: Diagnosis not present

## 2021-01-12 LAB — CUP PACEART REMOTE DEVICE CHECK
Date Time Interrogation Session: 20220814232159
Implantable Pulse Generator Implant Date: 20190503

## 2021-01-31 NOTE — Progress Notes (Signed)
Carelink Summary Report / Loop Recorder 

## 2021-02-16 ENCOUNTER — Ambulatory Visit (INDEPENDENT_AMBULATORY_CARE_PROVIDER_SITE_OTHER): Payer: BC Managed Care – PPO

## 2021-02-16 DIAGNOSIS — R002 Palpitations: Secondary | ICD-10-CM

## 2021-02-18 LAB — CUP PACEART REMOTE DEVICE CHECK
Date Time Interrogation Session: 20220916232225
Implantable Pulse Generator Implant Date: 20190503

## 2021-02-23 NOTE — Progress Notes (Signed)
Carelink Summary Report / Loop Recorder 

## 2021-03-16 ENCOUNTER — Telehealth: Payer: Self-pay

## 2021-03-16 NOTE — Telephone Encounter (Signed)
The patient was returning nurse call. I let her know that her Loop has reached RRT. She had questions about the apple watch.  I let her speak with Leigh, rn. I ordered her a return kit.

## 2021-03-16 NOTE — Telephone Encounter (Signed)
LINQ alert received. Device has reached RRT 10/14.  Attempted to contact patient to advise and discuss explant vs. Leave device in. No answer, LMTCB.  Marked "I' in Paceart.  Canceled future remotes.

## 2021-03-18 NOTE — Telephone Encounter (Signed)
Open In error. 

## 2021-03-24 ENCOUNTER — Other Ambulatory Visit: Payer: Self-pay

## 2021-03-24 ENCOUNTER — Ambulatory Visit (INDEPENDENT_AMBULATORY_CARE_PROVIDER_SITE_OTHER): Payer: BC Managed Care – PPO

## 2021-03-24 DIAGNOSIS — I351 Nonrheumatic aortic (valve) insufficiency: Secondary | ICD-10-CM | POA: Diagnosis not present

## 2021-03-24 LAB — ECHOCARDIOGRAM COMPLETE
Area-P 1/2: 3.66 cm2
P 1/2 time: 504 msec
S' Lateral: 2.9 cm

## 2021-04-21 ENCOUNTER — Telehealth: Payer: Self-pay

## 2021-04-21 NOTE — Telephone Encounter (Signed)
Called and advised patient of recommendation.

## 2021-04-21 NOTE — Telephone Encounter (Signed)
-----   Message from Duke Salvia, MD sent at 04/20/2021  2:32 AM EST ----- Regarding: RE: Apple Watch Leigh  good morning.   I dont see my last note but I do think the apple watch may be a good alternative for monitoring for .occult atrial fibrillation. Thanks SK    ----- Message ----- From: Lenor Coffin, RN Sent: 03/16/2021   9:49 AM EST To: Duke Salvia, MD, Alois Cliche, RN Subject: Apple Watch                                    Patients ILR has reached RRT. Patient said you mentioned to her about getting an apple watch. She wants to know if you still think she needs one and curious about how accurate. Advised patient I would forward to see your recommendations.   Thanks, Marliss Czar

## 2021-04-26 DIAGNOSIS — I351 Nonrheumatic aortic (valve) insufficiency: Secondary | ICD-10-CM | POA: Insufficient documentation

## 2021-04-26 NOTE — Progress Notes (Signed)
Cardiology Office Note   Date:  04/28/2021   ID:  Erica Ho, DOB 09/09/1980, MRN [id number]  PCP:  Eber Jones, NP  Cardiologist:   Rollene Rotunda, MD  Referring:  Eber Jones, NP  Chief Complaint  Patient presents with   Palpitations       History of Present Illness: Erica Ho is a 40 y.o. female who presents for evaluation of dizziness. She has had an extensive work up.  She has been seen by physicians at [facility], [place] and here.   She was seen by [facility] cardiology.  She had 30 day event monitor was found have PACs but no symptomatic dysrhythmias. There was sinus tachycardia noted. She did have an echocardiogram and it was normal.   At one point she had  loss of vision. She had an evaluation to include a CT which was essentially unremarkable. She was seeing neurology and had an MRI . In December 2017 she had shortness of breath and some chest discomfort and had a pulmonary embolism documented. She's been on anticoagulation. She saw cardiology after this and had a question of atrial fibrillation though no documentation. She wore an event monitor. She had a superficial thrombosis but was never been found to have a DVT. She's had no clotting disorders documented.   She has had chest pain and a negative POET (Plain Old Exercise Treadmill).  Work up at [facility] did not demonstrate an autonomic disorder.   She saw Dr. Graciela Husbands and had an event monitor placed.   She had PVCs and PACs.  I reviewed the Sept 2022 device check for this visit.  She had chest pain at a recent visit and this was thought to be non anginal.  Echo follow up demonstrated no significant abnormalities.    She has mild to moderate AI.    Her device battery he has not has now failed.  She has a Estate agent.  She has known cardiovascular symptoms.  She has no new palpitations other than occasionally at night she might wake up with her heart racing.  She did this the other night and her block demonstrated  normal rhythm just a rapid rate.  She had no new chest discomfort, neck or arm discomfort.  She had no new presyncope or syncope.  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 05/2016: event monitor showing PAC's with one episode of 4 beats NSVT   History of migraine headaches    PE (pulmonary thromboembolism) (HCC)    a. 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   Varicose veins     Past Surgical History:  Procedure Laterality Date   CESAREAN SECTION     CHOLECYSTECTOMY     ENDOVENOUS ABLATION SAPHENOUS VEIN W/ LASER Left 12/15/2016   endovenous laser ablation L GSV and stab phlebectomy > 20 incisions left leg by Josephina Gip MD    LOOP RECORDER INSERTION N/A 09/30/2017   Procedure: LOOP RECORDER INSERTION;  Surgeon: Duke Salvia, MD;  [facility] INVASIVE CV LAB;  Service: Cardiovascular;  Laterality: N/A;   TONSILLECTOMY     TUBAL LIGATION       Current Outpatient Medications  Medication Sig Dispense Refill   acetaminophen (TYLENOL) 500 MG  tablet Take 500-1,000 mg by mouth daily as needed for moderate pain or headache.      clonazePAM (KLONOPIN) 0.5 MG tablet Take 0.5 mg by mouth 2 (two) times daily.     ferrous sulfate 325 (65 FE) MG EC tablet Take 325 mg by mouth daily with breakfast.      fluticasone (FLONASE) 50 MCG/ACT nasal spray Place into both nostrils.     magnesium oxide (MAG-OX) 400 (241.3 Mg) MG tablet Take 1 tablet (400 mg total) by mouth daily.     rivaroxaban (XARELTO) 10 MG TABS tablet Take 1 tablet (10 mg total) by mouth daily. 90 tablet 0   topiramate (TOPAMAX) 50 MG tablet Topamax 50 mg tablet     iron polysaccharides (NIFEREX) 150 MG capsule Take 1 capsule (150 mg total) by mouth daily. 30 capsule 4   No current facility-administered medications for this visit.    Allergies:   Imitrex  [sumatriptan], Cardizem [diltiazem], Isovue [iopamidol], Levofloxacin, Nsaids, and Tolmetin    ROS:  Please see the history of present illness.   Otherwise, review of systems are positive for none.   All other systems are reviewed and negative.    PHYSICAL EXAM: VS:  BP 100/65   Pulse 68   Ht 5' 1.2" (1.554 m)   Wt 143 lb 6.4 oz (65 kg)   SpO2 99%   BMI 26.92 kg/m  , BMI Body mass index is 26.92 kg/m.  GENERAL:  Well appearing NECK:  No jugular venous distention, waveform within normal limits, carotid upstroke brisk and symmetric, no bruits, no thyromegaly LUNGS:  Clear to auscultation bilaterally CHEST:  Unremarkable HEART:  PMI not displaced or sustained,S1 and S2 within normal limits, no S3, no S4, no clicks, no rubs, 2 out of 6 diastolic murmur heard best at the third left intercostal space, no systolic murmurs ABD:  Flat, positive bowel sounds normal in frequency in pitch, no bruits, no rebound, no guarding, no midline pulsatile mass, no hepatomegaly, no splenomegaly EXT:  2 plus pulses throughout, no edema, no cyanosis no clubbing   EKG:  EKG is  ordered.  Sinus rhythm, rate 68, axis within normal limits, intervals within normal limits, no acute ST-T wave changes.  Recent Labs: 12/02/2020: ALT 14; BUN 8; Creatinine 0.74; Hemoglobin 12.6; Platelet Count 239; Potassium 4.1; Sodium 142    Lipid Panel    Component Value Date/Time   CHOL 173 08/30/2016 0000   TRIG 46 08/30/2016 0000   HDL 68 08/30/2016 0000   LDLCALC 96 08/30/2016 0000      Wt Readings from Last 3 Encounters:  04/27/21 143 lb 6.4 oz (65 kg)  12/02/20 164 lb 3.2 oz (74.5 kg)  04/18/20 154 lb (69.9 kg)      Other studies Reviewed:  Additional studies/ records that were reviewed today include:   Labs Review of the above records demonstrates:    See above   ASSESSMENT AND PLAN:   THROMBOPHLEBITIS:    She has been on chronic anticoagulation per her hematologist and she will continue with this.    ARRHYTHMIA:   She is going to record any arrhythmias on the Watch     AI:  This is moderate.  This was on echo in October of this year.  I will follow this with an echo next year.  Current medicines are reviewed at length with the patient today.  The patient does not have concerns regarding medicines.  The following changes have been made:  None  Labs/ tests ordered today include:   None  Orders Placed This Encounter  Procedures   EKG 12-Lead   ECHOCARDIOGRAM COMPLETE      Disposition:   FU with me in 12 months.   Signed, Rollene Rotunda, MD  04/28/2021 5:36 PM    [facility]

## 2021-04-27 ENCOUNTER — Ambulatory Visit: Payer: BC Managed Care – PPO | Admitting: Cardiology

## 2021-04-27 ENCOUNTER — Other Ambulatory Visit: Payer: Self-pay

## 2021-04-27 ENCOUNTER — Encounter: Payer: Self-pay | Admitting: Cardiology

## 2021-04-27 VITALS — BP 100/65 | HR 68 | Ht 61.2 in | Wt 143.4 lb

## 2021-04-27 DIAGNOSIS — R072 Precordial pain: Secondary | ICD-10-CM

## 2021-04-27 DIAGNOSIS — I351 Nonrheumatic aortic (valve) insufficiency: Secondary | ICD-10-CM

## 2021-04-27 NOTE — Patient Instructions (Addendum)
Medication Instructions:  Your Physician recommend you continue on your current medication as directed.    *If you need a refill on your cardiac medications before your next appointment, please call your pharmacy*   Lab Work: None   Testing/Procedures: in October 2023 Your physician has requested that you have an echocardiogram. Echocardiography is a painless test that uses sound waves to create images of your heart. It provides your doctor with information about the size and shape of your heart and how well your heart's chambers and valves are working. This procedure takes approximately one hour. There are no restrictions for this procedure.   Follow-Up: At [facility], you and your health needs are our priority.  As part of our continuing mission to provide you with exceptional heart care, we have created designated Provider Care Teams.  These Care Teams include your primary Cardiologist (physician) and Advanced Practice Providers (APPs -  Physician Assistants and Nurse Practitioners) who all work together to provide you with the care you need, when you need it.  We recommend signing up for the patient portal called "MyChart".  Sign up information is provided on this After Visit Summary.  MyChart is used to connect with patients for Virtual Visits (Telemedicine).  Patients are able to view lab/test results, encounter notes, upcoming appointments, etc.  Non-urgent messages can be sent to your provider as well.   To learn more about what you can do with MyChart, go to [email or web address].    Your next appointment:   12 month(s)  The format for your next appointment:   In Person  Provider:   Rollene Rotunda, MD

## 2021-04-28 ENCOUNTER — Encounter: Payer: Self-pay | Admitting: Cardiology

## 2021-05-14 ENCOUNTER — Other Ambulatory Visit: Payer: Self-pay

## 2021-05-14 ENCOUNTER — Telehealth: Payer: Self-pay

## 2021-05-14 DIAGNOSIS — I83892 Varicose veins of left lower extremities with other complications: Secondary | ICD-10-CM

## 2021-05-14 NOTE — Telephone Encounter (Signed)
Patient calls today to report that over the last week the veins in the upper part of her left leg "have been popping out." She had a laser ablation by [facility] in 2018. She has been seen by [facility] in the interim. She would like to come back to [facility]. Patient aware she will need to come on 2 separate days to be seen prior to the end of the year. Placed on schedule for lle venous reflux and MD visit.

## 2021-05-15 ENCOUNTER — Ambulatory Visit (HOSPITAL_COMMUNITY): Payer: BC Managed Care – PPO

## 2021-05-18 ENCOUNTER — Ambulatory Visit (HOSPITAL_COMMUNITY)
Admission: RE | Admit: 2021-05-18 | Discharge: 2021-05-18 | Disposition: A | Discharge status: Home / Self Care | Payer: BC Managed Care – PPO | Source: Ambulatory Visit | Attending: Vascular Surgery | Admitting: Vascular Surgery

## 2021-05-18 ENCOUNTER — Other Ambulatory Visit: Payer: Self-pay

## 2021-05-18 DIAGNOSIS — I83892 Varicose veins of left lower extremities with other complications: Secondary | ICD-10-CM | POA: Diagnosis not present

## 2021-05-20 ENCOUNTER — Other Ambulatory Visit: Payer: Self-pay

## 2021-05-20 ENCOUNTER — Encounter: Payer: Self-pay | Admitting: Vascular Surgery

## 2021-05-20 ENCOUNTER — Ambulatory Visit: Payer: BC Managed Care – PPO | Admitting: Vascular Surgery

## 2021-05-20 VITALS — BP 107/70 | HR 71 | Temp 98.4°F | Resp 20 | Ht 61.5 in | Wt 148.0 lb

## 2021-05-20 DIAGNOSIS — I83892 Varicose veins of left lower extremities with other complications: Secondary | ICD-10-CM | POA: Diagnosis not present

## 2021-05-20 NOTE — Progress Notes (Signed)
Patient ID: Erica Ho, female   DOB: [birth year], 40 y.o.   MRN: [id number]  Reason for Consult: No chief complaint on file.   Referred by Eber Jones, NP  Subjective:     HPI:  Erica Ho is a 40 y.o. female previously underwent left GSV ablation with stab phlebectomies.  She initially did very well from this.  She has continued wearing thigh-high stockings since the time of her procedure about 4 to 5 years ago.  She does have a history of multiple superficial clots in her varicose veins and also had a PE of unexplained origin for which she saw Dr. Cyndie Chime.  For that reason she remains on Xarelto.  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  She is here today with reflux duplex of her left lower extremity.  She states that these varicosities have presented themselves over the past month and have become increasingly uncomfortable.  She has not had any clotting of the varicosities but is somewhat concerned about this.  She has had swelling of the left leg since before the first procedure and this is the reason she wears compression religiously.  She does not have any current bleeding issues on Xarelto.  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 05/2016: event monitor showing PAC's with one episode of 4 beats NSVT   History of migraine headaches    PE (pulmonary thromboembolism) (HCC)    a. 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   Varicose veins    Family History  Problem Relation Age of Onset   Diabetes Mother    Clotting disorder Neg Hx    Past Surgical History:  Procedure Laterality Date   CESAREAN SECTION     CHOLECYSTECTOMY     ENDOVENOUS ABLATION SAPHENOUS VEIN W/ LASER Left 12/15/2016   endovenous laser ablation L GSV and stab phlebectomy > 20 incisions left leg by Josephina Gip MD     LOOP RECORDER INSERTION N/A 09/30/2017   Procedure: LOOP RECORDER INSERTION;  Surgeon: Duke Salvia, MD;  Location: [facility] INVASIVE CV LAB;  Service: Cardiovascular;  Laterality: N/A;   TONSILLECTOMY     TUBAL LIGATION      Short Social History:  Social History   Tobacco Use   Smoking status: Former    Types: Cigarettes    Quit date: 08/26/2011    Years since quitting: 9.7   Smokeless tobacco: Never  Substance Use Topics   Alcohol use: No    Alcohol/week: 0.0 standard drinks    Allergies  Allergen Reactions   Imitrex [Sumatriptan] Palpitations   Cardizem [Diltiazem] Other (See Comments)    Caused blood pressure to drop   Isovue [Iopamidol] Other (See Comments)    Couldn't focus eyes well afterwards; things looked like they had shadows   Levofloxacin Other (See Comments)    Skin felt like it was on "fire"   Nsaids Other (See Comments)    Cannot take because she is taking Xarelto now    Tolmetin Other (See Comments)    Cannot take because she is taking Xarelto now    Current Outpatient Medications  Medication Sig Dispense Refill   acetaminophen (TYLENOL) 500 MG tablet Take 500-1,000 mg by mouth daily as needed  for moderate pain or headache.      clonazePAM (KLONOPIN) 0.5 MG tablet Take 0.5 mg by mouth 2 (two) times daily.     ferrous sulfate 325 (65 FE) MG EC tablet Take 325 mg by mouth daily with breakfast.      fluticasone (FLONASE) 50 MCG/ACT nasal spray Place into both nostrils.     iron polysaccharides (NIFEREX) 150 MG capsule Take 1 capsule (150 mg total) by mouth daily. 30 capsule 4   magnesium oxide (MAG-OX) 400 (241.3 Mg) MG tablet Take 1 tablet (400 mg total) by mouth daily.     rivaroxaban (XARELTO) 10 MG TABS tablet Take 1 tablet (10 mg total) by mouth daily. 90 tablet 0   topiramate (TOPAMAX) 50 MG tablet Topamax 50 mg tablet     No current facility-administered medications for this visit.    Review of Systems  Constitutional:  Constitutional  negative. HENT: HENT negative.  Eyes: Eyes negative.  Respiratory: Respiratory negative.  Cardiovascular: Positive for leg swelling.  Musculoskeletal: Positive for leg pain.  Skin: Skin negative.  Neurological: Neurological negative. Hematologic: Hematologic/lymphatic negative.  Psychiatric: Psychiatric negative.       Objective:  Objective  Vitals:   05/20/21 1550  BP: 107/70  Pulse: 71  Resp: 20  Temp: 98.4 F (36.9 C)  SpO2: 100%     Physical Exam HENT:     Head: Normocephalic.     Nose:     Comments: Wearing a mask Eyes:     Pupils: Pupils are equal, round, and reactive to light.  Pulmonary:     Effort: Pulmonary effort is normal.  Abdominal:     General: Abdomen is flat.     Palpations: Abdomen is soft.  Musculoskeletal:     Comments: Small varicosities anterior left thigh no varicosities right lower extremity Healed stab phlebectomy left medial leg  Skin:    General: Skin is warm and dry.     Capillary Refill: Capillary refill takes less than 2 seconds.  Neurological:     General: No focal deficit present.     Mental Status: She is alert.  Psychiatric:        Mood and Affect: Mood normal.        Behavior: Behavior normal.        Thought Content: Thought content normal.        Judgment: Judgment normal.    Data: LEFT               Reflux No Reflux Reflux Time Diameter cms Comments                                            Yes                                                 +------------------+---------+------+-----------+------------+-------------  ----+   CFV                           yes    >1 second                                    +------------------+---------+------+-----------+------------+-------------  ----+  FV mid             no                                                             +------------------+---------+------+-----------+------------+-------------  ----+   Popliteal          no                                                              +------------------+---------+------+-----------+------------+-------------  ----+   GSV at [facility]                    yes     >500 ms       1.04                          +------------------+---------+------+-----------+------------+-------------  ----+   GSV prox thigh                yes     >500 ms      0.645                          +------------------+---------+------+-----------+------------+-------------  ----+   GSV mid thigh                 yes     >500 ms      0.201                          +------------------+---------+------+-----------+------------+-------------  ----+   GSV dist thigh                                               prior                                                                                 ablation/strippin                                                                 g                     +------------------+---------+------+-----------+------------+-------------  ----+   GSV at knee  prior                                                                                 ablation/strippin                                                                 g                     +------------------+---------+------+-----------+------------+-------------  ----+   GSV prox calf                                                prior                                                                                 ablation/strippin                                                                 g                     +------------------+---------+------+-----------+------------+-------------  ----+   SSV Pop Fossa      no                              0.287                          +------------------+---------+------+-----------+------------+-------------  ----+   SSV prox calf      no                              0.249                           +------------------+---------+------+-----------+------------+-------------  ----+   SSV mid calf       no                               0.27                          +------------------+---------+------+-----------+------------+-------------  ----+  varicose branch               yes     >500 ms      0.468                           mid thigh                                                                         +------------------+---------+------+-----------+------------+-------------  ----+       Summary:  Left:  - No evidence of deep vein thrombosis seen in the left lower extremity,  from the common femoral through the popliteal veins.  - No evidence of superficial venous thrombosis in the left lower  extremity.     - Deep vein reflux in the CFV.  - Superficial vein reflux in the SFJ and GSV to mid thigh. Reflux in a  varicose branch mid thigh and distally.      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  This is either due to reopening of her greater saphenous vein which probably has some increased risk due to her use of anticoagulants or it could be an enlarged tributary of the greater saphenous vein or an aberrant branch.  Either way it appears to be leading to the varicosities.  She has associated swelling and she is compliant with thigh-high compression stockings and has been for many years.  Given the new onset of the varicosities and pain I have recommended continued compression stockings and NSAIDs as needed.  If these continue to give her a problem she can follow-up and we can consider saphenous vein ablation and stab phlebectomies again.     Maeola Harman MD [facility]

## 2021-05-20 NOTE — Progress Notes (Signed)
3

## 2021-06-25 ENCOUNTER — Ambulatory Visit: Payer: BC Managed Care – PPO | Admitting: Allergy and Immunology

## 2021-08-13 ENCOUNTER — Other Ambulatory Visit: Payer: Self-pay

## 2021-08-13 ENCOUNTER — Ambulatory Visit: Payer: BC Managed Care – PPO | Admitting: Allergy and Immunology

## 2021-08-13 ENCOUNTER — Encounter: Payer: Self-pay | Admitting: Allergy and Immunology

## 2021-08-13 VITALS — BP 108/70 | HR 64 | Resp 14 | Ht 61.0 in | Wt 154.0 lb

## 2021-08-13 DIAGNOSIS — R0602 Shortness of breath: Secondary | ICD-10-CM

## 2021-08-13 DIAGNOSIS — Z7722 Contact with and (suspected) exposure to environmental tobacco smoke (acute) (chronic): Secondary | ICD-10-CM | POA: Diagnosis not present

## 2021-08-13 DIAGNOSIS — J3089 Other allergic rhinitis: Secondary | ICD-10-CM

## 2021-08-13 DIAGNOSIS — G43909 Migraine, unspecified, not intractable, without status migrainosus: Secondary | ICD-10-CM | POA: Diagnosis not present

## 2021-08-13 NOTE — Progress Notes (Signed)
?[place] ? ? ?Dear Terrilyn SaverJoanna Inman, ? ?Thank you for referring Erica Ho C Zara to [facility] [place] on 08/13/2021.  ? ?Below is a summation of this patient's evaluation and recommendations. ? ?Thank you for your referral. I will keep you informed about this patient's response to treatment.  ? ?If you have any questions please do not hesitate to contact me.  ? ?Sincerely, ? ?Erica PriestEric J. Miraj Truss, MD ?Allergy / Immunology ?[facility] [place] ? ? ?______________________________________________________________________ ? ? ? ?NEW PATIENT NOTE ? ?Referring Provider: Eber JonesInman, Joanna R, NP ?Primary Provider: Eber JonesInman, Joanna R, NP ?Date of office visit: 08/13/2021 ?   ?Subjective:  ? ?Chief Complaint:  Erica Ho (DOB: 10/01/1980) is a 41 y.o. female who presents to the clinic on 08/13/2021 with a chief complaint of migraines and Shortness of Breath ?.    ? ?HPI: Erica Ho presents to this clinic in evaluation of several issues. ? 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  About 4 times per day she will have an ocular migraine manifested as a big spot of patch work vision that last about 30 minutes.  This appears to be active even though she is using Topamax for over a year.  Interestingly, when she was using 20 mg of Xarelto every day in the treatment of her pulmonary embolus she did not have any migraine headaches.  At 10 mg a day of Xarelto she has migraine headache frequency as described above.  She does not consume any caffeine or chocolate. ? ?She also appears to have an issue with nasal congestion and sneezing and itchy red watery eyes.  One of the triggers for this issue is vape exposure. ? ?She also has an issue with intermittent shortness of breath.  Apparently I saw her years ago for this issue and it did not appear as though she  had asthma based upon pre and post bronchodilators pulmonary function testing and her lack of response to various controller agents used for asthma.  She still occasionally gets shortness of breath when she exerts herself.  Sometimes she will get shortness of breath when she is sitting.  These episodes lasts minutes and do not appear to have any precipitant and are not associated with any systemic or constitutional symptoms or other respiratory tract symptoms.  She has had a problem with palpitations in the past but that is not a particularly big issue at this point in time. ? ?Past Medical History:  ?Diagnosis Date  ? ABLATION SUPERFICAL VIENS LEFT LEG 11/2016  ? Anemia   ? RESOLVED  ? Anxiety   ? Arthritis   ? LOWER BACK  ? Blood dyscrasia   ? Headache   ? OCULAR MIGRAINES  ? Heart palpitations   ? a. 05/2016: event monitor showing PAC's with one episode of 4 beats NSVT  ? History of migraine headaches   ? 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 Varicose veins   ? ? ?Past Surgical History:  ?Procedure Laterality Date  ? CESAREAN SECTION    ? CHOLECYSTECTOMY    ? ENDOVENOUS ABLATION SAPHENOUS VEIN W/ LASER Left 12/15/2016  ? endovenous laser ablation L GSV and stab phlebectomy > 20 incisions left leg by Josephina GipJames Lawson MD   ? LOOP RECORDER INSERTION N/A 09/30/2017  ?  Procedure: LOOP RECORDER INSERTION;  Surgeon: Duke Salvia, MD;  Location: [facility] INVASIVE CV LAB;  Service: Cardiovascular;  Laterality: N/A;  ? TONSILLECTOMY    ? TUBAL LIGATION    ? WISDOM TOOTH EXTRACTION    ? ? ?Allergies as of 08/13/2021   ? ?   Reactions  ? Imitrex [sumatriptan] Palpitations  ? Cardizem [diltiazem] Other (See Comments)  ? Caused blood pressure to drop  ? Isovue [iopamidol] Other (See Comments)  ? Couldn't focus eyes well afterwards; things looked like they had shadows  ? Levofloxacin Other (See Comments)  ? Skin felt like it was on "fire"  ? Nsaids Other (See Comments)  ? Cannot take because she  is taking Xarelto now  ? Other Palpitations  ? STEROID INJECTION-PATIENT UNSURE OF NAME OF INJECTION- CAUSED RAPID HEART RATE  ? Tolmetin Other (See Comments)  ? Cannot take because she is taking Xarelto now  ? ?  ? ?  ?Medication List  ? ? ?clonazePAM 0.5 MG tablet ?Commonly known as: KLONOPIN ?Take 0.5 mg by mouth 2 (two) times daily. ?  ?ferrous sulfate 325 (65 FE) MG EC tablet ?Take 325 mg by mouth daily with breakfast. ?  ?fluticasone 50 MCG/ACT nasal spray ?Commonly known as: FLONASE ?Place into both nostrils. ?  ?magnesium oxide 400 (241.3 Mg) MG tablet ?Commonly known as: MAG-OX ?Take 1 tablet (400 mg total) by mouth daily. ?  ?rivaroxaban 10 MG Tabs tablet ?Commonly known as: XARELTO ?Take 10 mg by mouth daily. ?  ?topiramate 50 MG tablet ?Commonly known as: TOPAMAX ?Topamax 50 mg tablet ?  ? ?Review of systems negative except as noted in HPI / PMHx or noted below: ? ?Review of Systems  ?Constitutional: Negative.   ?HENT: Negative.    ?Eyes: Negative.   ?Respiratory: Negative.    ?Cardiovascular: Negative.   ?Gastrointestinal: Negative.   ?Genitourinary: Negative.   ?Musculoskeletal: Negative.   ?Skin: Negative.   ?Neurological: Negative.   ?Endo/Heme/Allergies: Negative.   ?Psychiatric/Behavioral: Negative.    ? ?Family History  ?Problem Relation Age of Onset  ? Diabetes Mother   ? Emphysema Mother   ? Cancer Father   ? Lupus Daughter   ? Clotting disorder Neg Hx   ? ? ?Social History  ? ?Socioeconomic History  ? Marital status: Married  ?  Spouse name: Not on file  ? Number of children: Not on file  ? Years of education: Not on file  ? Highest education level: Not on file  ?Occupational History  ? Occupation: Conservation officer, nature  ?Tobacco Use  ? Smoking status: Former  ?  Types: Cigarettes  ?  Quit date: 08/26/2011  ?  Years since quitting: 9.9  ? Smokeless tobacco: Never  ? Tobacco comments:  ?  Smoked about 12 years, off and on  ?Vaping Use  ? Vaping Use: Never used  ?Substance and Sexual Activity  ? Alcohol use: No   ?  Alcohol/week: 0.0 standard drinks  ? Drug use: No  ? Sexual activity: Yes  ?  Partners: Male  ?Other Topics Concern  ? Not on file  ?Social History Narrative  ? Not on file  ? ?Environmental and Social history ? ?Lives in a house with a dry environment, cat and dog located inside the household, no carpet in the bedroom, plastic on the bed, plastic on the pillow, exposure to vape material with inside the household, and employment as a Youth worker. ? ?Objective:  ? ?Vitals:  ? 08/13/21 1418  ?BP:  108/70  ?Pulse: 64  ?Resp: 14  ?SpO2: 98%  ? ?Height: 5\' 1"  (154.9 cm) ?Weight: 154 lb (69.9 kg) ? ?Physical Exam ?Constitutional:   ?   Appearance: She is not diaphoretic.  ?HENT:  ?   Head: Normocephalic.  ?   Right Ear: Tympanic membrane, ear canal and external ear normal.  ?   Left Ear: Tympanic membrane, ear canal and external ear normal.  ?   Nose: Nose normal. No mucosal edema or rhinorrhea.  ?   Mouth/Throat:  ?   Pharynx: Uvula midline. No oropharyngeal exudate.  ?Eyes:  ?   Conjunctiva/sclera: Conjunctivae normal.  ?Neck:  ?   Thyroid: No thyromegaly.  ?   Trachea: Trachea normal. No tracheal tenderness or tracheal deviation.  ?Cardiovascular:  ?   Rate and Rhythm: Normal rate and regular rhythm.  ?   Heart sounds: Normal heart sounds, S1 normal and S2 normal. No murmur heard. ?Pulmonary:  ?   Effort: No respiratory distress.  ?   Breath sounds: Normal breath sounds. No stridor. No wheezing or rales.  ?Lymphadenopathy:  ?   Head:  ?   Right side of head: No tonsillar adenopathy.  ?   Left side of head: No tonsillar adenopathy.  ?   Cervical: No cervical adenopathy.  ?Skin: ?   Findings: No erythema or rash.  ?   Nails: There is no clubbing.  ?Neurological:  ?   Mental Status: She is alert.  ? ? ?Diagnostics: Allergy skin tests were performed.  She did not demonstrate any hypersensitivity against a screening panel of aeroallergens or foods. ? ?Spirometry was performed and demonstrated an FEV1 of 3.02 @ 113  % of predicted. FEV1/FVC = 0.92 ? ?Results of a chest CT scan angio obtained 03 November 2016 identified the following: ? ?Cardiovascular: Satisfactory opacification of the pulmonary arteries ?to the segmen

## 2021-08-13 NOTE — Patient Instructions (Addendum)
?  1.  Allergen avoidance measures? ? ?2.  Eliminate indoor and car vape exposure ? ?3.  Flonase -1-2 sprays each nostril 3-7 times a week.  Takes days to work ? ?4.  Antihistamine if needed ? ?5.  Ajovy???  Emgality???  Botox injections??? ? ?6.  Consistent and progressive aerobic exercise program ? ?

## 2021-08-17 ENCOUNTER — Encounter: Payer: Self-pay | Admitting: Allergy and Immunology

## 2021-10-05 ENCOUNTER — Ambulatory Visit: Payer: BC Managed Care – PPO | Admitting: Podiatrist

## 2022-03-04 ENCOUNTER — Ambulatory Visit: Payer: BC Managed Care – PPO

## 2022-03-15 ENCOUNTER — Ambulatory Visit: Payer: BC Managed Care – PPO

## 2022-03-24 ENCOUNTER — Ambulatory Visit: Payer: BC Managed Care – PPO | Attending: Cardiology

## 2022-03-24 DIAGNOSIS — I351 Nonrheumatic aortic (valve) insufficiency: Secondary | ICD-10-CM

## 2022-03-24 DIAGNOSIS — I082 Rheumatic disorders of both aortic and tricuspid valves: Secondary | ICD-10-CM

## 2022-03-24 LAB — ECHOCARDIOGRAM COMPLETE
Area-P 1/2: 3.33 cm2
P 1/2 time: 562 msec
S' Lateral: 3.7 cm

## 2022-04-25 NOTE — Progress Notes (Signed)
Cardiology Office Note   Date:  04/26/2022   ID:  Erica Ho, DOB Jan 30, 1981, MRN [id number]  PCP:  Eber Jones, NP  Cardiologist:   Rollene Rotunda, MD  Referring:  Eber Jones, NP   Chief Complaint  Patient presents with   AI      History of Present Illness: Erica Ho is a 41 y.o. female who presents for evaluation of dizziness. She has had an extensive work up.  She has been seen by physicians at [facility] [place] and here.   She was seen by [facility] cardiology.  She had 30 day event monitor was found have PACs but no symptomatic dysrhythmias. There was sinus tachycardia noted. She did have an echocardiogram and it was normal.   At one point she had  loss of vision. She had an evaluation to include a CT which was essentially unremarkable. She was seeing neurology and had an MRI. In December 2017 she had shortness of breath and some chest discomfort and had a pulmonary embolism documented. She's been on anticoagulation. She saw cardiology after this and had a question of atrial fibrillation though no documentation. She wore an event monitor. She had a superficial thrombosis but was never been found to have a DVT. She's had no clotting disorders documented.   She has had chest pain and a negative POET (Plain Old Exercise Treadmill).  Work up at [facility] did not demonstrate an autonomic disorder.   She saw Dr. Graciela Husbands and had an event monitor placed.   She had PVCs and PACs.   Her device battery has expired.  We are following her rhythm with a Watch.   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  The patient denies any new symptoms such as neck or arm discomfort. There has been no new shortness of breath, PND or orthopnea. There have been no reported palpitations, presyncope or syncope.  She said several days ago she had 1 episode of epigastric discomfort that went through to her back that she not  had before.  This came and went spontaneously.  She is otherwise not had any new cardiovascular symptoms.   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 05/2016: event monitor showing PAC's with one episode of 4 beats NSVT   History of migraine headaches    PE (pulmonary thromboembolism) (HCC)    a. 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   Varicose veins     Past Surgical History:  Procedure Laterality Date   CESAREAN SECTION     CHOLECYSTECTOMY     ENDOVENOUS ABLATION SAPHENOUS VEIN W/ LASER Left 12/15/2016   endovenous laser ablation L GSV and stab phlebectomy > 20 incisions left leg by Josephina Gip MD    LOOP RECORDER INSERTION N/A 09/30/2017   Procedure: LOOP RECORDER INSERTION;  Surgeon: Duke Salvia, MD;  Location: [facility] INVASIVE CV LAB;  Service: Cardiovascular;  Laterality: N/A;   TONSILLECTOMY     TUBAL LIGATION     WISDOM TOOTH EXTRACTION       Current Outpatient Medications  Medication Sig Dispense Refill   clonazePAM (KLONOPIN) 0.5 MG  tablet Take 0.5 mg by mouth 2 (two) times daily.     ferrous sulfate 325 (65 FE) MG EC tablet Take 325 mg by mouth daily with breakfast.      fluticasone (FLONASE) 50 MCG/ACT nasal spray Place into both nostrils.     magnesium oxide (MAG-OX) 400 (241.3 Mg) MG tablet Take 1 tablet (400 mg total) by mouth daily.     rivaroxaban (XARELTO) 10 MG TABS tablet Take 10 mg by mouth daily.     topiramate (TOPAMAX) 50 MG tablet Topamax 50 mg tablet     No current facility-administered medications for this visit.    Allergies:   Imitrex [sumatriptan], Cardizem [diltiazem], Isovue [iopamidol], Levofloxacin, Nsaids, Other, and Tolmetin    ROS:  Please see the history of present illness.   Otherwise, review of systems are positive for none.   All other systems are reviewed and  negative.    PHYSICAL EXAM: VS:  BP 122/80   Pulse 67   Ht 5\' 1"  (1.549 m)   Wt 162 lb 3.2 oz (73.6 kg)   SpO2 99%   BMI 30.65 kg/m  , BMI Body mass index is 30.65 kg/m.  GENERAL:  Well appearing NECK:  No jugular venous distention, waveform within normal limits, carotid upstroke brisk and symmetric, no bruits, no thyromegaly LUNGS:  Clear to auscultation bilaterally CHEST:  Unremarkable HEART:  PMI not displaced or sustained,S1 and S2 within normal limits, no S3, no S4, no clicks, no rubs, no murmurs ABD:  Flat, positive bowel sounds normal in frequency in pitch, no bruits, no rebound, no guarding, no midline pulsatile mass, no hepatomegaly, no splenomegaly EXT:  2 plus pulses throughout, no edema, no cyanosis no clubbing   EKG:  EKG is  ordered.  Sinus rhythm, rate 67, axis within normal limits, intervals within normal limits, no acute ST-T wave changes.  Recent Labs: No results found for requested labs within last 365 days.    Lipid Panel    Component Value Date/Time   CHOL 173 08/30/2016 0000   TRIG 46 08/30/2016 0000   HDL 68 08/30/2016 0000   LDLCALC 96 08/30/2016 0000      Wt Readings from Last 3 Encounters:  04/26/22 162 lb 3.2 oz (73.6 kg)  08/13/21 154 lb (69.9 kg)  05/20/21 148 lb (67.1 kg)      Other studies Reviewed:  Additional studies/ records that were reviewed today include:   Echo Review of the above records demonstrates:    See elsewhere   ASSESSMENT AND PLAN:   THROMBOPHLEBITIS:    She has been on chronic anticoagulation per her hematologist.  Of asked her to check with her primary to see if they done a CBC recently and she is due to see them soon for blood work.   ARRHYTHMIA: She has her Watch and has not recorded any significant dysrhythmias.  No change in therapy.   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  No change in therapy.  I will follow with echo in 2 years.  Moderate.  This was on echo in October of this year.  I will follow this with an echo next  year.  Current medicines are reviewed at length with the patient today.  The patient does not have concerns regarding medicines.  The following changes have been made:  None  Labs/ tests ordered today include:   None  No orders of the defined types were placed in this encounter.    Disposition:   FU with me  in 24 months.   Signed, Rollene Rotunda, MD  04/26/2022 12:44 PM    [facility]

## 2022-04-26 ENCOUNTER — Ambulatory Visit: Payer: BC Managed Care – PPO | Attending: Cardiology | Admitting: Cardiology

## 2022-04-26 ENCOUNTER — Encounter: Payer: Self-pay | Admitting: Cardiology

## 2022-04-26 VITALS — BP 122/80 | HR 67 | Ht 61.0 in | Wt 162.2 lb

## 2022-04-26 DIAGNOSIS — I809 Phlebitis and thrombophlebitis of unspecified site: Secondary | ICD-10-CM

## 2022-04-26 DIAGNOSIS — I061 Rheumatic aortic insufficiency: Secondary | ICD-10-CM | POA: Diagnosis not present

## 2022-04-26 DIAGNOSIS — I499 Cardiac arrhythmia, unspecified: Secondary | ICD-10-CM

## 2022-04-26 DIAGNOSIS — I351 Nonrheumatic aortic (valve) insufficiency: Secondary | ICD-10-CM | POA: Diagnosis not present

## 2022-04-26 NOTE — Patient Instructions (Signed)
Medication Instructions:  Continue same medications *If you need a refill on your cardiac medications before your next appointment, please call your pharmacy*   Lab Work: None ordered   Testing/Procedures: Schedule Echo in 2 years   Follow-Up: At [facility], you and your health needs are our priority.  As part of our continuing mission to provide you with exceptional heart care, we have created designated Provider Care Teams.  These Care Teams include your primary Cardiologist (physician) and Advanced Practice Providers (APPs -  Physician Assistants and Nurse Practitioners) who all work together to provide you with the care you need, when you need it.  We recommend signing up for the patient portal called "MyChart".  Sign up information is provided on this After Visit Summary.  MyChart is used to connect with patients for Virtual Visits (Telemedicine).  Patients are able to view lab/test results, encounter notes, upcoming appointments, etc.  Non-urgent messages can be sent to your provider as well.   To learn more about what you can do with MyChart, go to [email or web address].    Your next appointment:  2 years        The format for your next appointment: Office    Provider:  Dr.Hochrein   Important Information About Sugar

## 2022-07-12 ENCOUNTER — Other Ambulatory Visit: Payer: Self-pay | Admitting: Student

## 2022-07-12 DIAGNOSIS — Z1231 Encounter for screening mammogram for malignant neoplasm of breast: Secondary | ICD-10-CM

## 2022-07-19 ENCOUNTER — Ambulatory Visit
Admission: RE | Admit: 2022-07-19 | Discharge: 2022-07-19 | Disposition: A | Discharge status: Home / Self Care | Payer: BC Managed Care – PPO | Source: Ambulatory Visit | Attending: Student | Admitting: Student

## 2022-07-19 DIAGNOSIS — Z1231 Encounter for screening mammogram for malignant neoplasm of breast: Secondary | ICD-10-CM

## 2022-08-13 ENCOUNTER — Encounter: Payer: Self-pay | Admitting: Cardiology

## 2022-08-13 MED ORDER — RIVAROXABAN 10 MG PO TABS: 10.0000 mg | ORAL_TABLET | Freq: Every day | ORAL | 0 refills | Status: AC

## 2022-10-06 ENCOUNTER — Telehealth: Payer: Self-pay | Admitting: *Deleted

## 2022-10-06 NOTE — Telephone Encounter (Signed)
S/w pt to schedule telephone clearance.    Patient Consent for Virtual Visit         Erica Ho has provided verbal consent on 10/06/2022 for a virtual visit (video or telephone).   CONSENT FOR VIRTUAL VISIT FOR:  Erica Ho  By participating in this virtual visit I agree to the following:  I hereby voluntarily request, consent and authorize [facility] and its employed or contracted physicians, physician assistants, nurse practitioners or other licensed health care professionals (the Practitioner), to provide me with telemedicine health care services (the "Services") as deemed necessary by the treating Practitioner. I acknowledge and consent to receive the Services by the Practitioner via telemedicine. I understand that the telemedicine visit will involve communicating with the Practitioner through live audiovisual communication technology and the disclosure of certain medical information by electronic transmission. I acknowledge that I have been given the opportunity to request an in-person assessment or other available alternative prior to the telemedicine visit and am voluntarily participating in the telemedicine visit.  I understand that I have the right to withhold or withdraw my consent to the use of telemedicine in the course of my care at any time, without affecting my right to future care or treatment, and that the Practitioner or I may terminate the telemedicine visit at any time. I understand that I have the right to inspect all information obtained and/or recorded in the course of the telemedicine visit and may receive copies of available information for a reasonable fee.  I understand that some of the potential risks of receiving the Services via telemedicine include:  Delay or interruption in medical evaluation due to technological equipment failure or disruption; Information transmitted may not be sufficient (e.g. poor resolution of images) to allow for  appropriate medical decision making by the Practitioner; and/or  In rare instances, security protocols could fail, causing a breach of personal health information.  Furthermore, I acknowledge that it is my responsibility to provide information about my medical history, conditions and care that is complete and accurate to the best of my ability. I acknowledge that Practitioner's advice, recommendations, and/or decision may be based on factors not within their control, such as incomplete or inaccurate data provided by me or distortions of diagnostic images or specimens that may result from electronic transmissions. I understand that the practice of medicine is not an exact science and that Practitioner makes no warranties or guarantees regarding treatment outcomes. I acknowledge that a copy of this consent can be made available to me via my patient portal [facility] MyChart), or I can request a printed copy by calling the office of [facility].    I understand that my insurance will be billed for this visit.   I have read or had this consent read to me. I understand the contents of this consent, which adequately explains the benefits and risks of the Services being provided via telemedicine.  I have been provided ample opportunity to ask questions regarding this consent and the Services and have had my questions answered to my satisfaction. I give my informed consent for the services to be provided through the use of telemedicine in my medical care

## 2022-10-06 NOTE — Telephone Encounter (Signed)
   Pre-operative Risk Assessment    Patient Name: Erica Ho  DOB: [birth year] MRN: [id number]      Request for Surgical Clearance    Procedure:   EXCISION OF RIGHT THUMB DIGITAL MUCOUS CYST  Date of Surgery:  Clearance TBD                                 Surgeon:  Waylan Rocher, MD Surgeon's Group or Practice Name:  [facility] Phone number:  [phone] Fax number:  [phone]   Type of Clearance Requested:   - Pharmacy:  Hold Rivaroxaban (Xarelto) NOT INDICATED HOW LONG   Type of Anesthesia:  Not Indicated   Additional requests/questions:    Erica Ho   10/06/2022, 11:09 AM

## 2022-10-06 NOTE — Telephone Encounter (Signed)
   Name: Erica Ho  DOB: [birth year]  MRN: [id number]  Primary Cardiologist: Rollene Rotunda, MD   Preoperative team, please contact this patient and set up a phone call appointment for further preoperative risk assessment. Please obtain consent and complete medication review. Thank you for your help.  I confirm that guidance regarding antiplatelet and oral anticoagulation therapy has been completed and, if necessary, noted below.  Xarelto prescribed by a noncardiology provider (hematology).  Recommendation for holding Xarelto deferred to prescribing provider.     Carlos Levering, NP 10/06/2022, 1:40 PM [facility]

## 2022-10-12 ENCOUNTER — Ambulatory Visit: Payer: BC Managed Care – PPO | Attending: Cardiology

## 2022-10-12 DIAGNOSIS — Z01810 Encounter for preprocedural cardiovascular examination: Secondary | ICD-10-CM

## 2022-10-12 NOTE — Progress Notes (Signed)
Virtual Visit via Telephone Note   Because of Erica Ho's co-morbid illnesses, she is at least at moderate risk for complications without adequate follow up.  This format is felt to be most appropriate for this patient at this time.  The patient did not have access to video technology/had technical difficulties with video requiring transitioning to audio format only (telephone).  All issues noted in this document were discussed and addressed.  No physical exam could be performed with this format.  Please refer to the patient's chart for her consent to telehealth for [facility].  Evaluation Performed:  Preoperative cardiovascular risk assessment _____________   Date:  10/12/2022   Patient ID:  Erica Ho, DOB Oct 18, 1980, MRN [id number] Patient Location:  Home Provider location:   Office  Primary Care Provider:  Alveria Apley, NP (Inactive) Primary Cardiologist:  Rollene Rotunda, MD  Chief Complaint / Patient Profile   42 y.o. y/o female with a h/o pulmonary embolism and remains on Xarelto, sinus tachycardia, and history of migraines who is pending excision of right thumb digital mucoid cyst and presents today for telephonic preoperative cardiovascular risk assessment.  History of Present Illness    Erica Ho is a 42 y.o. female who presents via audio/video conferencing for a telehealth visit today.  Pt was last seen in cardiology clinic on 04/26/2022 by Dr. Antoine Poche.  At that time Erica Ho was doing well .  The patient is now pending procedure as outlined above. Since her last visit, she states she has been doing well from a heart standpoint.  She states that Dr. Antoine Poche recently refilled her Xarelto and the surgeon needed guidance on holding parameters.  This was never sent to pharmacy because it was thought that is being handled by hematology.  Patient tells me today that she no longer sees hematology.  She remains very active and is still  working at [employer].  She has some running that she does for her job.  She scored well above a 4 METS on the DASI.  Bleeding risk for upcoming surgery is low.  Discussed holding Xarelto x 1 day prior to the procedure and restarting when the surgeon deems appropriate.  I will forward this recommendation onto the preop pharmacy team and Dr. Antoine Poche to make sure everyone is within agreement and I will let the patient know that if there are any changes to our recommendations I would personally give her a phone call.   Past Medical History    Past Medical History:  Diagnosis Date   ABLATION SUPERFICAL VIENS LEFT LEG 11/2016   Anemia    RESOLVED   Anxiety    Arthritis    LOWER BACK   Blood dyscrasia    Headache    OCULAR MIGRAINES   Heart palpitations    a. 05/2016: event monitor showing PAC's with one episode of 4 beats NSVT   History of migraine headaches    PE (pulmonary thromboembolism) (HCC)    a. 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   Varicose veins    Past Surgical History:  Procedure Laterality Date   CESAREAN SECTION     CHOLECYSTECTOMY     ENDOVENOUS ABLATION SAPHENOUS VEIN W/ LASER Left 12/15/2016   endovenous laser ablation L GSV and stab phlebectomy > 20 incisions left leg by Josephina Gip MD    LOOP RECORDER INSERTION N/A 09/30/2017   Procedure: LOOP RECORDER INSERTION;  Surgeon: Duke Salvia, MD;  Location: [facility] INVASIVE CV  LAB;  Service: Cardiovascular;  Laterality: N/A;   TONSILLECTOMY     TUBAL LIGATION     WISDOM TOOTH EXTRACTION      Allergies  Allergies  Allergen Reactions   Imitrex [Sumatriptan] Palpitations   Cardizem [Diltiazem] Other (See Comments)    Caused blood pressure to drop   Isovue [Iopamidol] Other (See Comments)    Couldn't focus eyes well afterwards; things looked like they had shadows   Levofloxacin Other (See Comments)    Skin felt like it was on "fire"   Nsaids Other (See Comments)    Cannot take because she is taking Xarelto  now    Other Palpitations    STEROID INJECTION-PATIENT UNSURE OF NAME OF INJECTION- CAUSED RAPID HEART RATE   Tolmetin Other (See Comments)    Cannot take because she is taking Xarelto now    Home Medications    Prior to Admission medications   Medication Sig Start Date End Date Taking? Authorizing Provider  clonazePAM (KLONOPIN) 0.5 MG tablet Take 0.5 mg by mouth 2 (two) times daily. 01/03/17   [provider]  doxycycline (VIBRAMYCIN) 100 MG capsule Take 100 mg by mouth 2 (two) times daily. For acne    [provider]  ferrous sulfate 325 (65 FE) MG EC tablet Take 325 mg by mouth daily with breakfast.     [provider]  fluticasone (FLONASE) 50 MCG/ACT nasal spray Place into both nostrils. 04/04/20   [provider]  magnesium oxide (MAG-OX) 400 (241.3 Mg) MG tablet Take 1 tablet (400 mg total) by mouth daily. 08/04/18   Duke Salvia, MD  rivaroxaban (XARELTO) 10 MG TABS tablet Take 1 tablet (10 mg total) by mouth daily. 08/13/22   Rollene Rotunda, MD    Physical Exam    Vital Signs:  Erica Ho does not have vital signs available for review today.  Given telephonic nature of communication, physical exam is limited. AAOx3. NAD. Normal affect.  Speech and respirations are unlabored.  Accessory Clinical Findings    None  Assessment & Plan    1.  Preoperative Cardiovascular Risk Assessment:  Erica Ho perioperative risk of a major cardiac event is 0.4% according to the Revised Cardiac Risk Index (RCRI).  Therefore, she is at low risk for perioperative complications.   Her functional capacity is excellent at 7.53 METs according to the [facility] Activity Status Index (DASI). Recommendations: According to ACC/AHA guidelines, no further cardiovascular testing needed.  The patient may proceed to surgery at acceptable risk.   Antiplatelet and/or Anticoagulation Recommendations:  Xarelto (Rivaroxaban) can be held for 1 days prior to surgery.   Please resume post op when felt to be safe.  Would also get PCP to weigh in.    The patient was advised that if she develops new symptoms prior to surgery to contact our office to arrange for a follow-up visit, and she verbalized understanding.  A copy of this note will be routed to requesting surgeon.  Time:   Today, I have spent 5 minutes with the patient with telehealth technology discussing medical history, symptoms, and management plan.     Sharlene Dory, PA-C  10/12/2022, 2:04 PM

## 2022-10-26 ENCOUNTER — Encounter (HOSPITAL_BASED_OUTPATIENT_CLINIC_OR_DEPARTMENT_OTHER): Payer: Self-pay | Admitting: Orthopedic Surgery

## 2022-12-07 ENCOUNTER — Other Ambulatory Visit: Payer: Self-pay

## 2022-12-07 ENCOUNTER — Encounter (HOSPITAL_BASED_OUTPATIENT_CLINIC_OR_DEPARTMENT_OTHER): Payer: Self-pay | Admitting: Orthopedic Surgery

## 2022-12-15 ENCOUNTER — Encounter (HOSPITAL_BASED_OUTPATIENT_CLINIC_OR_DEPARTMENT_OTHER)
Admission: RE | Disposition: A | Discharge status: Home / Self Care | Payer: Self-pay | Source: Home / Self Care | Attending: Orthopedic Surgery

## 2022-12-15 ENCOUNTER — Ambulatory Visit (HOSPITAL_BASED_OUTPATIENT_CLINIC_OR_DEPARTMENT_OTHER)
Admission: RE | Admit: 2022-12-15 | Discharge: 2022-12-15 | Disposition: A | Discharge status: Home / Self Care | Payer: BC Managed Care – PPO | Attending: Orthopedic Surgery | Admitting: Orthopedic Surgery

## 2022-12-15 ENCOUNTER — Encounter (HOSPITAL_BASED_OUTPATIENT_CLINIC_OR_DEPARTMENT_OTHER): Payer: Self-pay | Admitting: Orthopedic Surgery

## 2022-12-15 DIAGNOSIS — R2231 Localized swelling, mass and lump, right upper limb: Secondary | ICD-10-CM | POA: Diagnosis not present

## 2022-12-15 HISTORY — PX: MASS EXCISION: SHX2000

## 2022-12-15 SURGERY — MINOR EXCISION OF MASS
Anesthesia: LOCAL | Site: Thumb | Laterality: Right

## 2022-12-15 MED ORDER — LIDOCAINE HCL (PF) 1 % IJ SOLN
INTRAMUSCULAR | Status: AC
  Filled 2022-12-15: qty 30

## 2022-12-15 MED ORDER — BUPIVACAINE HCL (PF) 0.25 % IJ SOLN
INTRAMUSCULAR | Status: AC
  Filled 2022-12-15: qty 30

## 2022-12-15 MED ORDER — LIDOCAINE HCL 1 % IJ SOLN
INTRAMUSCULAR | Status: DC | PRN
  Administered 2022-12-15: 5 mL

## 2022-12-15 MED ORDER — 0.9 % SODIUM CHLORIDE (POUR BTL) OPTIME
TOPICAL | Status: DC | PRN
  Administered 2022-12-15: 100 mL

## 2022-12-15 MED ORDER — BUPIVACAINE HCL (PF) 0.25 % IJ SOLN
INTRAMUSCULAR | Status: DC | PRN
  Administered 2022-12-15: 5 mL

## 2022-12-15 SURGICAL SUPPLY — 39 items
APL PRP STRL LF DISP 70% ISPRP (MISCELLANEOUS) ×1
BLADE SURG 15 STRL LF DISP TIS (BLADE) ×1 IMPLANT
BLADE SURG 15 STRL SS (BLADE) ×1
BNDG CMPR 5X3 KNIT ELC UNQ LF (GAUZE/BANDAGES/DRESSINGS) ×1
BNDG CMPR 9X4 STRL LF SNTH (GAUZE/BANDAGES/DRESSINGS) ×1
BNDG ELASTIC 3INX 5YD STR LF (GAUZE/BANDAGES/DRESSINGS) ×1 IMPLANT
BNDG ESMARK 4X9 LF (GAUZE/BANDAGES/DRESSINGS) ×1 IMPLANT
BNDG GAUZE DERMACEA FLUFF 4 (GAUZE/BANDAGES/DRESSINGS) ×1 IMPLANT
BNDG GZE DERMACEA 4 6PLY (GAUZE/BANDAGES/DRESSINGS)
BNDG PLASTER X FAST 3X3 WHT LF (CAST SUPPLIES) IMPLANT
BNDG PLSTR 9X3 FST ST WHT (CAST SUPPLIES)
CHLORAPREP W/TINT 26 (MISCELLANEOUS) ×1 IMPLANT
CORD BIPOLAR FORCEPS 12FT (ELECTRODE) ×1 IMPLANT
COVER BACK TABLE 60X90IN (DRAPES) ×1 IMPLANT
CUFF TOURN SGL QUICK 18X4 (TOURNIQUET CUFF) IMPLANT
CUFF TOURN SGL QUICK 24 (TOURNIQUET CUFF)
CUFF TRNQT CYL 24X4X16.5-23 (TOURNIQUET CUFF) IMPLANT
DRAPE EXTREMITY T 121X128X90 (DISPOSABLE) ×1 IMPLANT
DRAPE SURG 17X23 STRL (DRAPES) ×1 IMPLANT
GAUZE XEROFORM 1X8 LF (GAUZE/BANDAGES/DRESSINGS) ×1 IMPLANT
GLOVE BIO SURGEON STRL SZ7 (GLOVE) ×1 IMPLANT
GLOVE BIOGEL PI IND STRL 7.0 (GLOVE) ×1 IMPLANT
GOWN STRL REUS W/ TWL LRG LVL3 (GOWN DISPOSABLE) ×2 IMPLANT
GOWN STRL REUS W/TWL LRG LVL3 (GOWN DISPOSABLE) ×2
NDL HYPO 25X1 1.5 SAFETY (NEEDLE) IMPLANT
NEEDLE HYPO 25X1 1.5 SAFETY (NEEDLE) ×1
NS IRRIG 1000ML POUR BTL (IV SOLUTION) ×1 IMPLANT
PACK BASIN DAY SURGERY FS (CUSTOM PROCEDURE TRAY) ×1 IMPLANT
PAD CAST 3X4 CTTN HI CHSV (CAST SUPPLIES) IMPLANT
PADDING CAST COTTON 3X4 STRL (CAST SUPPLIES)
SHEET MEDIUM DRAPE 40X70 STRL (DRAPES) ×1 IMPLANT
SLEEVE SCD COMPRESS KNEE MED (STOCKING) IMPLANT
SUT ETHILON 4 0 PS 2 18 (SUTURE) ×1 IMPLANT
SUT MNCRL AB 3-0 PS2 18 (SUTURE) ×1 IMPLANT
SUT VIC AB 4-0 PS2 18 (SUTURE) IMPLANT
SYR BULB EAR ULCER 3OZ GRN STR (SYRINGE) ×1 IMPLANT
SYR CONTROL 10ML LL (SYRINGE) IMPLANT
TOWEL GREEN STERILE FF (TOWEL DISPOSABLE) ×2 IMPLANT
UNDERPAD 30X36 HEAVY ABSORB (UNDERPADS AND DIAPERS) ×1 IMPLANT

## 2022-12-15 NOTE — Op Note (Signed)
Date of Surgery: 12/15/2022  INDICATIONS: Patient is a 42 y.o.-year-old female with a mass at the dorsal radial aspect of the right thumb around the DIP joint.  The mass has been present for a year and is increasingly more bothersome.  She had the area aspirated by another physician with reported return of clear, mucinous fluid consistent with a digital mucous cyst.  Risks, benefits, and alternatives to surgery were again discussed with the patient in the preoperative area. The patient wishes to proceed with surgery.  Informed consent was signed after our discussion.   PREOPERATIVE DIAGNOSIS:  Right thumb digital mucous cyst  POSTOPERATIVE DIAGNOSIS: Same.  PROCEDURE:  Excision of right thumb digital mucous cyst Debridement of right thumb distal phalangeal osteophyte   SURGEON: Waylan Rocher, M.D.  ASSIST: None  ANESTHESIA:  Local  IV FLUIDS AND URINE: See anesthesia.  ESTIMATED BLOOD LOSS: <5 mL.  IMPLANTS: * No implants in log *   DRAINS: None  COMPLICATIONS: None  DESCRIPTION OF PROCEDURE: The patient was met in the preoperative holding area where the surgical site was marked and the informed consent form was signed.  The patient was then brought back to the operating room and remained on the stretcher.  A hand table was placed adjacent to the operative extremity and locked into place.  A formal timeout was performed to confirm that this was the correct patient, surgical side, surgical site, and surgical procedure.  All were present and in agreement. Following formal timeout, a local block was performed using 10 mL of an equal mixture of 1% plain lidocaine and 0.25% plain marcaine.  The right upper extremity was then prepped and draped in the usual and sterile fashion.    Following a second formal timeout a Penrose drain was wrapped around the base of the thumb, pulled taut, and clamped with a hemostat.  I made a longitudinal incision directly over the lesion at the level of  the IP joint.  The skin was incised.  Full-thickness skin flaps were elevated.  There was a large cyst at the ulnar aspect of the EPL tendon.  The cyst appeared to be coming from the IP joint.  A capsulectomy at the ulnar aspect of the thumb IP joint was performed.  Within this cystic mass, there was a solid, round, osseous fragment that was several millimeters in diameter.  This seems to be the solid mass that the patient was able to feel and was the most bothersome for her.  This was excised.  EPL tendon insertion on the distal phalanx was intact.  There were some small osteophyte from the dorsal base of the distal phalanx which was debrided using a rongeur.  At this point, the wound was thoroughly irrigated with copious sterile saline.  It was closed in a combination of simple interrupted and horizontal mattress fashion using a 4-0 Vicryl Rapide suture.  The Penrose drain was then removed.  Hemostasis was achieved with direct pressure over the wound.  The thumb was then dressed with Xeroform, folded 4 x 4's, Kling wrap, and a loose Coban.  The thumb was warm and well-perfused with brisk capillary refill within the procedure.  All counts were correct x 2 at the end of the procedure.  The patient was then taken to the PACU in stable condition.   POSTOPERATIVE PLAN: Patient will be discharged home with appropriate pain medication and discharge instructions.  I will see her back in 10 to 14 days for her first postop visit.  Waylan Rocher, MD 9:13 AM

## 2022-12-15 NOTE — Discharge Instructions (Signed)
 Erica Ho, M.D. Hand Surgery  POST-OPERATIVE DISCHARGE INSTRUCTIONS   PRESCRIPTIONS: - You may have been given a prescription to be taken as directed for post-operative pain control.  You may also take over the counter ibuprofen/aleve and tylenol for pain. Take this as directed on the packaging. Do not exceed 3000 mg tylenol/acetaminophen in 24 hours.  Ibuprofen 600-800 mg (3-4) tablets by mouth every 6 hours as needed for pain.   OR  Aleve 2 tablets by mouth every 12 hours (twice daily) as needed for pain.   AND/OR  Tylenol 1000 mg (2 tablets) every 8 hours as needed for pain.  - Please use your pain medication carefully, as refills are limited and you may not be provided with one.  As stated above, please use over the counter pain medicine - it will also be helpful with decreasing your swelling.    ANESTHESIA: -After your surgery, post-surgical discomfort or pain is likely. This discomfort can last several days to a few weeks. At certain times of the day your discomfort may be more intense.   Did you receive a nerve block?   - A nerve block can provide pain relief for one hour to two days after your surgery. As long as the nerve block is working, you will experience little or no sensation in the area the surgeon operated on.  - As the nerve block wears off, you will begin to experience pain or discomfort. It is very important that you begin taking your prescribed pain medication before the nerve block fully wears off. Treating your pain at the first sign of the block wearing off will ensure your pain is better controlled and more tolerable when full-sensation returns. Do not wait until the pain is intolerable, as the medicine will be less effective. It is better to treat pain in advance than to try and catch up.   General Anesthesia:  If you did not receive a nerve block during your surgery, you will need to start taking your pain medication shortly after your surgery and  should continue to do so as prescribed by your surgeon.     ICE AND ELEVATION: - You may use ice for the first 48-72 hours, but it is not critical.   - Motion of your fingers is very important to decrease the swelling.  - Elevation, as much as possible for the next 48 hours, is critical for decreasing swelling as well as for pain relief. Elevation means when you are seated or lying down, you hand should be at or above your heart. When walking, the hand needs to be at or above the level of your elbow.  - If the bandage gets too tight, it may need to be loosened. Please contact our office and we will instruct you in how to do this.    SURGICAL BANDAGES:  - Keep your dressing and/or splint clean and dry at all times.  You can remove your dressing 7 days from now and change with a dry dressing or Band-Aids as needed thereafter. - You may place a plastic bag over your bandage to shower, but be careful, do not get your bandages wet.  - After the bandages have been removed, it is OK to get the stitches wet in a shower or with hand washing. Do Not soak or submerge the wound yet. Please do not use lotions or creams on the stitches.      HAND THERAPY:  - You may not need any. If you   do, we will begin this at your follow up visit in the clinic.    ACTIVITY AND WORK: - You are encouraged to move any fingers which are not in the bandage.  - Light use of the fingers is allowed to assist the other hand with daily hygiene and eating, but strong gripping or lifting is often uncomfortable and should be avoided.  - You might miss a variable period of time from work and hopefully this issue has been discussed prior to surgery. You may not do any heavy work with your affected hand for about 2 weeks.    [facility] Second Floor, [address] [place], [place] [address] [phone]  

## 2022-12-15 NOTE — Interval H&P Note (Signed)
History and Physical Interval Note:  12/15/2022 8:24 AM  Erica Ho  has presented today for surgery, with the diagnosis of Right thumb digital mucous cyst.  The various methods of treatment have been discussed with the patient and family. After consideration of risks, benefits and other options for treatment, the patient has consented to  Procedure(s) with comments: MINOR EXCISION OF RIGHT THUMB DIGITAL MUCOUSCYST (Right) - 45 as a surgical intervention.  The patient's history has been reviewed, patient examined, no change in status, stable for surgery.  I have reviewed the patient's chart and labs.  Questions were answered to the patient's satisfaction.     Meylin Stenzel Othel Dicostanzo

## 2022-12-15 NOTE — H&P (Signed)
HAND SURGERY   HPI: Patient is a 42 y.o. female who presents with a mass at the dorsal radial aspect of the right thumb.  The mass is been about a year or so.  Has not changed in size but is becoming increasingly more bothersome.  Shares that she had a cyst in a similar location aspirated by another physician.  We discussed conservative management with observation versus surgical excision patient wishes to proceed with.  Patient denies any changes to their medical history or new systemic symptoms today.    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 05/2016: event monitor showing PAC's with one episode of 4 beats NSVT   History of migraine headaches    PE (pulmonary thromboembolism) (HCC)    a. 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   Varicose veins    Past Surgical History:  Procedure Laterality Date   CESAREAN SECTION     CHOLECYSTECTOMY     ENDOVENOUS ABLATION SAPHENOUS VEIN W/ LASER Left 12/15/2016   endovenous laser ablation L GSV and stab phlebectomy > 20 incisions left leg by Josephina Gip MD    LOOP RECORDER INSERTION N/A 09/30/2017   Procedure: LOOP RECORDER INSERTION;  Surgeon: Duke Salvia, MD;  Location: [facility] INVASIVE CV LAB;  Service: Cardiovascular;  Laterality: N/A;   TONSILLECTOMY     TUBAL LIGATION     WISDOM TOOTH EXTRACTION     Social History   Socioeconomic History   Marital status: Married    Spouse name: Not on file   Number of children: Not on file   Years of education: Not on file   Highest education level: Not on file  Occupational History   Occupation: Conservation officer, nature  Tobacco Use   Smoking status: Former    Current packs/day: 0.00    Types: Cigarettes    Quit date: 08/26/2011    Years since quitting: 11.3   Smokeless tobacco: Never   Tobacco comments:    Smoked about 12 years,  off and on  Vaping Use   Vaping status: Never Used  Substance and Sexual Activity   Alcohol use: No    Alcohol/week: 0.0 standard drinks of alcohol   Drug use: No   Sexual activity: Yes    Partners: Male  Other Topics Concern   Not on file  Social History Narrative   Not on file   Social Determinants of Health   Financial Resource Strain: Not on file  Food Insecurity: Not on file  Transportation Needs: Not on file  Physical Activity: Not on file  Stress: Not on file  Social Connections: Not on file   Family History  Problem Relation Age of Onset   Diabetes Mother    Emphysema Mother    Cancer Father    Lupus Daughter    Breast cancer Maternal Grandmother    Clotting disorder Neg Hx    - negative except otherwise stated in the family history section Allergies  Allergen Reactions   Imitrex [Sumatriptan] Palpitations   Cardizem [Diltiazem] Other (See Comments)    Caused blood pressure to drop   Isovue [Iopamidol] Other (See Comments)    Couldn't focus eyes well afterwards; things looked like they had shadows   Levofloxacin Other (See Comments)  Skin felt like it was on "fire"   Nsaids Other (See Comments)    Cannot take because she is taking Xarelto now    Other Palpitations    STEROID INJECTION-PATIENT UNSURE OF NAME OF INJECTION- CAUSED RAPID HEART RATE   Tolmetin Other (See Comments)    Cannot take because she is taking Xarelto now   Prior to Admission medications   Medication Sig Start Date End Date Taking? Authorizing Provider  clonazePAM (KLONOPIN) 0.5 MG tablet Take 0.5 mg by mouth 2 (two) times daily. 01/03/17  Yes [provider]  doxycycline (VIBRAMYCIN) 100 MG capsule Take 100 mg by mouth 2 (two) times daily. For acne   Yes [provider]  ferrous sulfate 325 (65 FE) MG EC tablet Take 325 mg by mouth daily with breakfast.    Yes [provider]  magnesium oxide (MAG-OX) 400 (241.3 Mg) MG tablet Take 1 tablet (400 mg total) by  mouth daily. 08/04/18  Yes Duke Salvia, MD  rivaroxaban (XARELTO) 10 MG TABS tablet Take 1 tablet (10 mg total) by mouth daily. 08/13/22  Yes Rollene Rotunda, MD  fluticasone (FLONASE) 50 MCG/ACT nasal spray Place into both nostrils. 04/04/20   [provider]   No results found. - Positive ROS: All other systems have been reviewed and were otherwise negative with the exception of those mentioned in the HPI and as above.  Physical Exam: General: No acute distress, resting comfortably Cardiovascular: BUE warm and well perfused, normal rate Respiratory: Normal WOB on RA Skin: Warm and dry Neurologic: Sensation intact distally Psychiatric: Patient is at baseline mood and affect  Right upper Extremity  Small, subcentimeter mass at the dorsal radial aspect of the thumb around the level of the IP joint.  Mass is firm, round, and mobile.  She is flexion range of motion of the thumb at the MCP and IP joint.  There are no changes to the nail plate.  Sensations intact light touch and.  Her hand is warm and well-perfused with cap refill.   Assessment: 42 year old female with a mass of the dorsal right radial aspect of the right thumb IP joint.  Plan: OR today for excision of the right dorsal thumb mass.. We again reviewed the risks of surgery which include, but are not limited to, bleeding, infection, damage to neurovascular structures, persistent symptoms, recurrence, delayed wound healing, stiffness, need for additional surgery.  Informed consent was signed.  All questions were answered.   Marlyne Beards, M.D. [facility] 8:21 AM

## 2022-12-16 ENCOUNTER — Encounter (HOSPITAL_BASED_OUTPATIENT_CLINIC_OR_DEPARTMENT_OTHER): Payer: Self-pay | Admitting: Orthopedic Surgery

## 2022-12-27 ENCOUNTER — Encounter: Payer: Self-pay | Admitting: Cardiology

## 2022-12-30 ENCOUNTER — Ambulatory Visit: Payer: BC Managed Care – PPO | Attending: Cardiovascular Disease | Admitting: Cardiovascular Disease

## 2022-12-30 ENCOUNTER — Encounter: Payer: Self-pay | Admitting: Cardiovascular Disease

## 2022-12-30 VITALS — BP 120/82 | HR 85 | Ht 61.0 in | Wt 172.4 lb

## 2022-12-30 DIAGNOSIS — M546 Pain in thoracic spine: Secondary | ICD-10-CM

## 2022-12-30 NOTE — Telephone Encounter (Signed)
Spoke with pt, she will see dr o'neal,dod, today at 2:30 p.

## 2022-12-30 NOTE — Telephone Encounter (Signed)
Spoke with pt, she reports having a squeezing pain that woke her Saturday morning and this morning. The pain starts in her back, wraps around to the front and then goes down the left arm. It will last 3-5 minutes and then will stop and she will not have it again during the day. She also reports nausea with the discomfort. She went to the [place] ER Saturday and reports her troponin was normal and they said it was a muscle spasm. There is nothing that makes the discomfort better or worse. She reports her medical doctor has done an ECG today and she is waiting for those results. Discussed with dr hochrein, will try to get the patient in this week as she will be gone next week.

## 2022-12-30 NOTE — Progress Notes (Signed)
Cardiology Office Note:   Date:  12/30/2022  NAME:  PORSHIA Ho    MRN: [id number] DOB:  [birth year]   PCP:  Erskine Emery, NP  Cardiologist:  Rollene Rotunda, MD  Electrophysiologist:  None   Referring MD: Erskine Emery, NP   Chief Complaint  Patient presents with   Back Pain    History of Present Illness:   Erica Ho is a 42 y.o. female with a hx of thrombophlebitis, PE who is being seen today for the evaluation of acute back pain.  She reports Saturday she woke up with intense pain under her left shoulder blade.  She describes a squeezing pain that would last 3 to 5 minutes.  She reports the pain will go into her left arm.  She reports activity would not make it worse or make it better.  She reports it was completely go away in 3 to 5 minutes.  She went to [facility] and ruled out for an acute coronary syndrome over the weekend.  She had recurrent symptoms this week and was seen by her primary care physician.  She tells me she had a CT PE study that was negative.  She reports she is not having it today.  Her EKG is normal.  Her primary care physician was concerned about possibly cardiac etiology.  She is not tender to examination with palpation.  Her medical history is significant for PE.  She is on low-dose Xarelto.  She has never had a heart attack or stroke.  She reports no increased exercise.  No change in other activity patterns.  She denies fevers or chills.  EKG is normal.  We discussed this is likely noncardiac.  Past Medical History: Past Medical History:  Diagnosis Date   ABLATION SUPERFICAL VIENS LEFT LEG 11/2016   Anemia    RESOLVED   Anxiety    Arthritis    LOWER BACK   Blood dyscrasia    Headache    OCULAR MIGRAINES   Heart palpitations    a. 05/2016: event monitor showing PAC's with one episode of 4 beats NSVT   History of migraine headaches    PE (pulmonary thromboembolism) (HCC)    a. 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   Varicose veins      Past Surgical History: Past Surgical History:  Procedure Laterality Date   CESAREAN SECTION     CHOLECYSTECTOMY     ENDOVENOUS ABLATION SAPHENOUS VEIN W/ LASER Left 12/15/2016   endovenous laser ablation L GSV and stab phlebectomy > 20 incisions left leg by Josephina Gip MD    LOOP RECORDER INSERTION N/A 09/30/2017   Procedure: LOOP RECORDER INSERTION;  Surgeon: Duke Salvia, MD;  Location: [facility] INVASIVE CV LAB;  Service: Cardiovascular;  Laterality: N/A;   MASS EXCISION Right 12/15/2022   Procedure: MINOR EXCISION OF RIGHT THUMB DIGITAL MUCOUSCYST;  Surgeon: Marlyne Beards, MD;  Location:  SURGERY CENTER;  Service: Orthopedics;  Laterality: Right;  43   TONSILLECTOMY     TUBAL LIGATION     WISDOM TOOTH EXTRACTION      Current Medications: Current Meds  Medication Sig   clonazePAM (KLONOPIN) 0.5 MG tablet Take 0.5 mg by mouth 2 (two) times daily.   doxycycline (VIBRAMYCIN) 100 MG capsule Take 100 mg by mouth 2 (two) times daily. For acne   ferrous sulfate 325 (65 FE) MG EC tablet Take 325 mg by mouth daily with breakfast.    fluticasone (FLONASE) 50 MCG/ACT nasal spray Place  into both nostrils.   magnesium oxide (MAG-OX) 400 (241.3 Mg) MG tablet Take 1 tablet (400 mg total) by mouth daily.   rivaroxaban (XARELTO) 10 MG TABS tablet Take 1 tablet (10 mg total) by mouth daily.   topiramate (TOPAMAX) 25 MG tablet Take 25 mg by mouth daily. Starts today     Allergies:    Imitrex [sumatriptan], Cardizem [diltiazem], Isovue [iopamidol], Levofloxacin, Nsaids, Other, and Tolmetin   Social History: Social History   Socioeconomic History   Marital status: Married    Spouse name: Not on file   Number of children: 2   Years of education: Not on file   Highest education level: Not on file  Occupational History   Occupation: Conservation officer, nature  Tobacco Use   Smoking status: Former    Current packs/day: 0.00    Types: Cigarettes    Quit date: 08/26/2011    Years since quitting:  11.3   Smokeless tobacco: Never   Tobacco comments:    Smoked about 12 years, off and on  Vaping Use   Vaping status: Never Used  Substance and Sexual Activity   Alcohol use: No    Alcohol/week: 0.0 standard drinks of alcohol   Drug use: No   Sexual activity: Yes    Partners: Male  Other Topics Concern   Not on file  Social History Narrative   Not on file   Social Determinants of Health   Financial Resource Strain: Not on file  Food Insecurity: Not on file  Transportation Needs: Not on file  Physical Activity: Not on file  Stress: Not on file  Social Connections: Not on file     Family History: The patient's family history includes Breast cancer in her maternal grandmother; Cancer in her father; Diabetes in her mother; Emphysema in her mother; Lupus in her daughter. There is no history of Clotting disorder.  ROS:   All other ROS reviewed and negative. Pertinent positives noted in the HPI.     EKGs/Labs/Other Studies Reviewed:   The following studies were personally reviewed by me today:  EKG:  EKG is ordered today.    EKG Interpretation Date/Time:  Thursday December 30 2022 14:44:58 EDT Ventricular Rate:  75 PR Interval:  158 QRS Duration:  84 QT Interval:  384 QTC Calculation: 428 R Axis:   68  Text Interpretation: Normal sinus rhythm Normal ECG Confirmed by Lennie Odor [phone]) on 12/30/2022 2:48:29 PM   TTE 03/24/2022  1. GLS -19.6. Left ventricular ejection fraction, by estimation, is 60 to  65%. The left ventricle has normal function. The left ventricle has no  regional wall motion abnormalities. Left ventricular diastolic parameters  were normal.   2. Right ventricular systolic function is normal. The right ventricular  size is normal. There is normal pulmonary artery systolic pressure.   3. The mitral valve is normal in structure. Trivial mitral valve  regurgitation. No evidence of mitral stenosis.   4. AI -Vena Contracta 0.3, P1/2t . The aortic  valve is normal in  structure. There is mild thickening of the aortic valve. Aortic valve  regurgitation is mild. No aortic stenosis is present.   5. The inferior vena cava is normal in size with greater than 50%  respiratory variability, suggesting right atrial pressure of 3 mmHg.   Recent Labs: No results found for requested labs within last 365 days.   Recent Lipid Panel    Component Value Date/Time   CHOL 173 08/30/2016 0000   TRIG 46 08/30/2016 0000  HDL 68 08/30/2016 0000   LDLCALC 96 08/30/2016 0000    Physical Exam:   VS:  BP 120/82 (BP Location: Left Arm, Patient Position: Sitting, Cuff Size: Normal)   Pulse 85   Ht 5\' 1"  (1.549 m)   Wt 172 lb 6.4 oz (78.2 kg)   LMP 12/07/2022 (Exact Date)   SpO2 99%   BMI 32.57 kg/m    Wt Readings from Last 3 Encounters:  12/30/22 172 lb 6.4 oz (78.2 kg)  12/07/22 164 lb 14.5 oz (74.8 kg)  04/26/22 162 lb 3.2 oz (73.6 kg)    General: Well nourished, well developed, in no acute distress Head: Atraumatic, normal size  Eyes: PEERLA, EOMI  Neck: Supple, no JVD Endocrine: No thryomegaly Cardiac: Normal S1, S2; RRR; no murmurs, rubs, or gallops Lungs: Clear to auscultation bilaterally, no wheezing, rhonchi or rales  Abd: Soft, nontender, no hepatomegaly  Ext: No edema, pulses 2+ Musculoskeletal: No deformities, BUE and BLE strength normal and equal Skin: Warm and dry, no rashes   Neuro: Alert and oriented to person, place, time, and situation, CNII-XII grossly intact, no focal deficits  Psych: Normal mood and affect   ASSESSMENT:   Erica Ho is a 42 y.o. female who presents for the following: 1. Acute left-sided thoracic back pain     PLAN:   1. Acute left-sided thoracic back pain -Describes acute back pain under the left shoulder blade.  Described as squeezing that goes in the left arm.  Does not appear to be chest pain.  Seen in the ER with negative workup for acute coronary syndrome.  EKG is normal today.  CV exam  is normal.  She tells me she had a CT PE study that was negative today.  Overall I suspect this is musculoskeletal in nature.  I recommended ice and heat as needed as well as ibuprofen.  This should help.  She can see [place] back as needed for this.  She follows with Dr. Antoine Poche every 2 years.  Disposition: Return if symptoms worsen or fail to improve.  Medication Adjustments/Labs and Tests Ordered: Current medicines are reviewed at length with the patient today.  Concerns regarding medicines are outlined above.  Orders Placed This Encounter  Procedures   EKG 12-Lead   No orders of the defined types were placed in this encounter.  Patient Instructions  Medication Instructions:  Your physician recommends that you continue on your current medications as directed. Please refer to the Current Medication list given to you today.  *If you need a refill on your cardiac medications before your next appointment, please call your pharmacy*   Lab Work: None   Testing/Procedures: None   Follow-Up: At [facility], you and your health needs are our priority.  As part of our continuing mission to provide you with exceptional heart care, we have created designated Provider Care Teams.  These Care Teams include your primary Cardiologist (physician) and Advanced Practice Providers (APPs -  Physician Assistants and Nurse Practitioners) who all work together to provide you with the care you need, when you need it.   Your next appointment:    As needed  Provider:   Rollene Rotunda, MD     Time Spent with Patient: I have spent a total of 25 minutes with patient reviewing hospital notes, telemetry, EKGs, labs and examining the patient as well as establishing an assessment and plan that was discussed with the patient.  > 50% of time was spent in direct patient  care.  Signed, Lenna Gilford. Flora Lipps, MD, [facility]  [facility]  [address] [place], [place] [address] [phone]  12/30/2022 3:17 PM

## 2022-12-30 NOTE — Patient Instructions (Signed)
Medication Instructions:  Your physician recommends that you continue on your current medications as directed. Please refer to the Current Medication list given to you today.  *If you need a refill on your cardiac medications before your next appointment, please call your pharmacy*   Lab Work: None   Testing/Procedures: None   Follow-Up: At [facility], you and your health needs are our priority.  As part of our continuing mission to provide you with exceptional heart care, we have created designated Provider Care Teams.  These Care Teams include your primary Cardiologist (physician) and Advanced Practice Providers (APPs -  Physician Assistants and Nurse Practitioners) who all work together to provide you with the care you need, when you need it.  Your next appointment:    As needed  Provider:   Minus Breeding, MD

## 2022-12-30 NOTE — Telephone Encounter (Signed)
Pt is returning call. She states she had the pain again this morning. She states has subsided now but she is still nauseous.  Please advise what pt should do.

## 2023-06-06 ENCOUNTER — Encounter: Payer: Self-pay | Admitting: Cardiology

## 2023-06-06 NOTE — Progress Notes (Signed)
 Cardiology Office Note    Date:  06/08/2023  ID:  Erica Ho, DOB 1980-08-07, MRN [id number] PCP:  Erica Angeline FALCON, NP  Cardiologist:  Erica Schilling, MD  Electrophysiologist:  None   Chief Complaint: Palpitations   History of Present Illness: .    Erica Ho is a 43 y.o. female with visit-pertinent history of atrial flutter/fib, history of PE, GERD and PACs.  She initially presented with dizziness following extensive workup at [facility].  She was previously seen at [facility] cardiology and had a 30-day event monitor that showed PACs and no significant arrhythmia.  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   In 04/2016 due to chest pain and dyspnea a CTA was performed and showed a PE, she was started on anticoagulation.  She did negative poet for chest pain.  She previously had several heart monitors and ILR that showed no atrial fibrillation.  She has previously not tolerated beta-blocker or calcium channel blocker.  She has previously evaluated at [facility] which showed no evidence of an autonomic disorder.  She was last seen by Dr. Schilling in 03/2022, she remained stable from a cardiac standpoint.  On 12/30/2022 she was evaluated by Dr. Barbaraann for intense pain under her left shoulder blade, described as a squeezing pain that would last 3 to 5 minutes.  She went to [facility] and was ruled out for an acute coronary syndrome that weekend.  However she had recurrent symptoms that week and was seen by her PCP.  Per reports she had a CT PE study that was negative.  It was felt to be musculoskeletal in nature.  On 06/06/2023 patient notified the office that she was sitting at work and had a different feeling of palpitations in her normal.  She checked her watch and her heart rate went up to over 170 bpm.  Per patient reports she presented to a walk-in clinic, her EKG was normal and her heart rate decreased to 120 bpm.  Today she  presents regarding palpitations.  She reports that on Friday she was sitting at work when she had a feeling as her heart was beating increasingly hard followed by fast heartbeats.  She checked her watch which indicated that her heart rate was overhead and 70 bpm.  She notes that as the day went on it slowly started to decrease, however when she went to bed that night it was still around 100 bpm.  When she woke up the next morning her heart rate was back in her normal range around 70 bpm.  She denies any further recurrence of new palpitations or increased heart rate.  She notes this is similar to how she felt in 2019, she has been compliant with her Xarelto .  She reports that she has been trying to decrease her use of Klonopin and has been having stress with work.  She reports some baseline shortness of breath which she attributes to deconditioning and intermittent chest discomfort attributed to musculoskeletal pain that is overall unchanged.  ROS: .   Today she denies lower extremity edema, fatigue, palpitations, melena, hematuria, hemoptysis, diaphoresis, weakness, presyncope, syncope, orthopnea, and PND.  All other systems are reviewed and otherwise negative. Studies Reviewed: Erica Ho    EKG:  EKG is ordered today, personally reviewed, demonstrating  EKG Interpretation Date/Time:  Wednesday June 08 2023 08:11:15 EST Ventricular Rate:  67 PR Interval:  160 QRS Duration:  80 QT Interval:  384 QTC Calculation: 405 R Axis:   67  Text Interpretation: Normal sinus rhythm Low voltage QRS When compared with ECG of 30-Dec-2022 14:44, No significant change was found Confirmed by Erica Ho [phone]) on 06/08/2023 8:12:54 AM   CV Studies:  Cardiac Studies & Procedures     STRESS TESTS  EXERCISE TOLERANCE TEST (ETT) 09/16/2016  Narrative  Blood pressure demonstrated a normal response to exercise.  There was no ST segment deviation noted during stress.  Negative exercise stress test for ischemia at an  adequate heart rate of 94% MPHR  Good exercise tolerance at 11 mets.  This is a low risk study.  ECHOCARDIOGRAM  ECHOCARDIOGRAM COMPLETE 03/24/2022  Narrative ECHOCARDIOGRAM REPORT    Patient Name:   Erica Ho Date of Exam: 03/24/2022 Medical Rec #:  [id number]        Height:       61.0 in Accession #:    [id number]       Weight:       154.0 lb Date of Birth:  1980/07/21        BSA:          1.690 m Patient Age:    41 years         BP:           108/70 mmHg Patient Gender: F                HR:           72 bpm. Exam Location:    Procedure: 2D Echo, Cardiac Doppler, Color Doppler and Strain Analysis  Indications:    Aortic valve insufficiency, etiology of cardiac valve disease unspecified [I35.1 (ICD-10-CM)]  History:        Patient has prior history of Echocardiogram examinations, most recent 03/24/2021. Arrythmias:Atrial Fibrillation; Signs/Symptoms:Precordial chest pain and Dizziness/Lightheadedness.  Sonographer:    Erica Ho RDCS Referring Phys: [id number] Erica Ho  IMPRESSIONS   1. GLS -19.6. Left ventricular ejection fraction, by estimation, is 60 to 65%. The left ventricle has normal function. The left ventricle has no regional wall motion abnormalities. Left ventricular diastolic parameters were normal. 2. Right ventricular systolic function is normal. The right ventricular size is normal. There is normal pulmonary artery systolic pressure. 3. The mitral valve is normal in structure. Trivial mitral valve regurgitation. No evidence of mitral stenosis. 4. AI -Vena Contracta 0.3, P1/2t . The aortic valve is normal in structure. There is mild thickening of the aortic valve. Aortic valve regurgitation is mild. No aortic stenosis is present. 5. The inferior vena cava is normal in size with greater than 50% respiratory variability, suggesting right atrial pressure of 3 mmHg.  FINDINGS Left Ventricle: GLS -19.6. Left ventricular ejection fraction, by  estimation, is 60 to 65%. The left ventricle has normal function. The left ventricle has no regional wall motion abnormalities. The left ventricular internal cavity size was normal in size. There is no left ventricular hypertrophy. Left ventricular diastolic parameters were normal.  Right Ventricle: The right ventricular size is normal. No increase in right ventricular wall thickness. Right ventricular systolic function is normal. There is normal pulmonary artery systolic pressure. The tricuspid regurgitant velocity is 2.39 m/s, and with an assumed right atrial pressure of 3 mmHg, the estimated right ventricular systolic pressure is 25.8 mmHg.  Left Atrium: Left atrial size was normal in size.  Right Atrium: Right atrial size was normal in size.  Pericardium: There is no evidence of pericardial effusion.  Mitral Valve:  The mitral valve is normal in structure. Trivial mitral valve regurgitation. No evidence of mitral valve stenosis.  Tricuspid Valve: The tricuspid valve is normal in structure. Tricuspid valve regurgitation is mild . No evidence of tricuspid stenosis.  Aortic Valve: AI -Vena Contracta 0.3, P1/2t . The aortic valve is normal in structure. There is mild thickening of the aortic valve. Aortic valve regurgitation is mild. Aortic regurgitation PHT measures 562 msec. No aortic stenosis is present.  Pulmonic Valve: The pulmonic valve was normal in structure. Pulmonic valve regurgitation is not visualized. No evidence of pulmonic stenosis.  Aorta: The aortic root is normal in size and structure.  Venous: The inferior vena cava is normal in size with greater than 50% respiratory variability, suggesting right atrial pressure of 3 mmHg.  IAS/Shunts: No atrial level shunt detected by color flow Doppler.   LEFT VENTRICLE PLAX 2D LVIDd:         5.10 cm   Diastology LVIDs:         3.70 cm   LV e' medial:    6.31 cm/s LV PW:         0.80 cm   LV E/e' medial:  16.5 LV IVS:         0.80 cm   LV e' lateral:   11.50 cm/s LVOT diam:     1.90 cm   LV E/e' lateral: 9.0 LV SV:         72 LV SV Index:   43 LVOT Area:     2.84 cm   RIGHT VENTRICLE             IVC RV S prime:     12.40 cm/s  IVC diam: 1.60 cm TAPSE (M-mode): 2.8 cm  LEFT ATRIUM             Index        RIGHT ATRIUM           Index LA diam:        3.60 cm 2.13 cm/m   RA Area:     13.60 cm LA Vol (A2C):   55.0 ml 32.54 ml/m  RA Volume:   33.30 ml  19.70 ml/m LA Vol (A4C):   40.0 ml 23.67 ml/m LA Biplane Vol: 49.6 ml 29.35 ml/m AORTIC VALVE LVOT Vmax:   113.00 cm/s LVOT Vmean:  81.800 cm/s LVOT VTI:    0.255 m AI PHT:      562 msec  AORTA Ao Root diam: 2.50 cm Ao Asc diam:  3.10 cm Ao Desc diam: 2.10 cm  MITRAL VALVE                TRICUSPID VALVE MV Area (PHT): 3.33 cm     TR Peak grad:   22.8 mmHg MV Decel Time: 228 msec     TR Vmax:        239.00 cm/s MV E velocity: 104.00 cm/s MV A velocity: 84.70 cm/s   SHUNTS MV E/A ratio:  1.23         Systemic VTI:  0.26 m Systemic Diam: 1.90 cm  Lamar Fitch MD Electronically signed by Lamar Fitch MD Signature Date/Time: 03/24/2022/5:17:17 PM    Final   MONITORS  CARDIAC EVENT MONITOR 12/31/2016  Narrative Indication:palpitations  Duration: s30d  Findings Symptoms Flutters sinus and occasionally PVC or PACs  Chet pain assoc with sinus  Shortness of breath assoc w Sinus  Seen and reviewed with pt by Dr [facility]  Current Reported Medications:.    Current Meds  Medication Sig   clonazePAM (KLONOPIN) 0.5 MG tablet Take 0.5 mg by mouth 2 (two) times daily.   doxycycline (VIBRAMYCIN) 100 MG capsule Take 100 mg by mouth 2 (two) times daily. For acne   ferrous sulfate 325 (65 FE) MG EC tablet Take 325 mg by mouth daily with breakfast.    fluticasone (FLONASE) 50 MCG/ACT nasal spray Place into both nostrils.   magnesium  oxide (MAG-OX) 400 (241.3 Mg) MG tablet Take 1 tablet (400 mg total) by mouth daily.    rivaroxaban  (XARELTO ) 10 MG TABS tablet Take 1 tablet (10 mg total) by mouth daily.    Physical Exam:    VS:  BP 124/62   Pulse 78   Ht 5' 1 (1.549 m)   Wt 174 lb 6.4 oz (79.1 kg)   SpO2 100%   BMI 32.95 kg/m    Wt Readings from Last 3 Encounters:  06/08/23 174 lb 6.4 oz (79.1 kg)  12/30/22 172 lb 6.4 oz (78.2 kg)  12/07/22 164 lb 14.5 oz (74.8 kg)    GEN: Well nourished, well developed in no acute distress NECK: No JVD; No carotid bruits CARDIAC: RRR, no murmurs, rubs, gallops RESPIRATORY:  Clear to auscultation without rales, wheezing or rhonchi  ABDOMEN: Soft, non-tender, non-distended EXTREMITIES:  No edema; No acute deformity   Asessement and Plan:.    Palpitations/PACs/?hx of atrial flutter: History of PVCs/PACs, she previously had ILR that showed no atrial fibrillation.  She has previously not tolerated beta-blocker or calcium channel blockers.  Patient reports that on Friday she had a sensation of heart palpitations described as a knocking sensation on her chest followed by increased heart rate up to 170 bpm.  Notes her heart rate slowly decreased as the day went on and by Saturday morning was back to her normal heart rate around 70 bpm.  She denies any further recurrence, notes that when her heart rate was extremely high she did become slightly lightheaded.  EKG today indicates normal sinus rhythm at 67 bpm.  Given singular episode of palpitations through shared decision making deferred cardiac monitor at this time, discussed if repeat palpitations can consider monitor. Check CBC, BMET, Mag and TSH.   Hx of PE/Thrombophlebitis: She has been on chronic anticoagulation per her hematologist.  Patient reports that she has been compliant with her Xarelto  although is concerned that her episode of palpitations may be related to possible PE.  She denies any changes in her breathing, notes she has some baseline dyspnea on exertion that she attributes to deconditioning.  She denies any  new chest pain, notes that she has some intermittent chest pain that is felt to be musculoskeletal in nature.  Given her history we will check a D-dimer today, discussed with patient that if result is elevated she would be directed to the emergency department for further evaluation.  AI: Noted to have mild AI on echo on 04/04/2022.  Given increased palpitations will check echocardiogram.    Disposition: F/u with Torra Pala, NP in 6 weeks or sooner if needed.   Signed, Jenney Brester D Jamarious Febo, NP

## 2023-06-06 NOTE — Telephone Encounter (Signed)
 Patient identification verified by 2 forms. Shirl Ellen, RN     Called and spoke to patient  Patient states:  - On 1/3 HR was 170bpm based on report from her apple watch. Checked HR due to experiencing palpitations.  - Pt visited walk in clinic. EKG was normal and HR dropped to 120s once at clinic.  - Baseline resting HR is 70s - Compliant with Xarelto  prescription   Patient denies:  - Palpitations since Friday. NO SOB, chest pain, vision changes or headache, fatigue, one-sided weakness or difficulty with speech.              Interventions/Plan: Patient not currently on rate control agent. OV scheduled with APP for 06/08/23 at 0800. Routing to primary cardiologist.    Reviewed ED warning signs/precautions  Patient agrees with plan, no questions at this time

## 2023-06-08 ENCOUNTER — Encounter: Payer: Self-pay | Admitting: Cardiology

## 2023-06-08 ENCOUNTER — Ambulatory Visit: Payer: 59 | Attending: Cardiology | Admitting: Cardiology

## 2023-06-08 VITALS — BP 124/62 | HR 78 | Ht 61.0 in | Wt 174.4 lb

## 2023-06-08 DIAGNOSIS — R002 Palpitations: Secondary | ICD-10-CM | POA: Diagnosis not present

## 2023-06-08 DIAGNOSIS — Z86711 Personal history of pulmonary embolism: Secondary | ICD-10-CM

## 2023-06-08 DIAGNOSIS — R000 Tachycardia, unspecified: Secondary | ICD-10-CM | POA: Diagnosis not present

## 2023-06-08 DIAGNOSIS — I809 Phlebitis and thrombophlebitis of unspecified site: Secondary | ICD-10-CM | POA: Diagnosis not present

## 2023-06-08 DIAGNOSIS — I351 Nonrheumatic aortic (valve) insufficiency: Secondary | ICD-10-CM

## 2023-06-08 NOTE — Patient Instructions (Signed)
 Medication Instructions:  No changes *If you need a refill on your cardiac medications before your next appointment, please call your pharmacy*  Lab Work: Today we are going to draw CBC, Bmet, Mag, TSH, and D-Dimer If you have labs (blood work) drawn today and your tests are completely normal, you will receive your results only by: MyChart Message (if you have MyChart) OR A paper copy in the mail If you have any lab test that is abnormal or we need to change your treatment, we will call you to review the results.  Testing/Procedures: Your physician has requested that you have an echocardiogram. Echocardiography is a painless test that uses sound waves to create images of your heart. It provides your doctor with information about the size and shape of your heart and how well your heart's chambers and valves are working. This procedure takes approximately one hour. There are no restrictions for this procedure. Please do NOT wear cologne, perfume, aftershave, or lotions (deodorant is allowed). Please arrive 15 minutes prior to your appointment time.  Please note: We ask at that you not bring children with you during ultrasound (echo/ vascular) testing. Due to room size and safety concerns, children are not allowed in the ultrasound rooms during exams. Our front office staff cannot provide observation of children in our lobby area while testing is being conducted. An adult accompanying a patient to their appointment will only be allowed in the ultrasound room at the discretion of the ultrasound technician under special circumstances. We apologize for any inconvenience.  Follow-Up: At [facility], you and your health needs are our priority.  As part of our continuing mission to provide you with exceptional heart care, we have created designated Provider Care Teams.  These Care Teams include your primary Cardiologist (physician) and Advanced Practice Providers (APPs -  Physician Assistants and  Nurse Practitioners) who all work together to provide you with the care you need, when you need it.  We recommend signing up for the patient portal called MyChart.  Sign up information is provided on this After Visit Summary.  MyChart is used to connect with patients for Virtual Visits (Telemedicine).  Patients are able to view lab/test results, encounter notes, upcoming appointments, etc.  Non-urgent messages can be sent to your provider as well.   To learn more about what you can do with MyChart, go to [email or web address].    Your next appointment:   6 week(s)  Provider:   Katlyn West, NP

## 2023-06-09 LAB — BASIC METABOLIC PANEL
BUN/Creatinine Ratio: 15 (ref 9–23)
BUN: 11 mg/dL (ref 6–24)
CO2: 24 mmol/L (ref 20–29)
Calcium: 9.8 mg/dL (ref 8.7–10.2)
Chloride: 100 mmol/L (ref 96–106)
Creatinine, Ser: 0.71 mg/dL (ref 0.57–1.00)
Glucose: 81 mg/dL (ref 70–99)
Potassium: 4.7 mmol/L (ref 3.5–5.2)
Sodium: 140 mmol/L (ref 134–144)
eGFR: 107 mL/min/{1.73_m2} (ref >59)

## 2023-06-09 LAB — TSH: TSH: 2.08 u[IU]/mL (ref 0.450–4.500)

## 2023-06-09 LAB — CBC
Hematocrit: 39.2 % (ref 34.0–46.6)
Hemoglobin: 12.7 g/dL (ref 11.1–15.9)
MCH: 28.5 pg (ref 26.6–33.0)
MCHC: 32.4 g/dL (ref 31.5–35.7)
MCV: 88 fL (ref 79–97)
Platelets: 272 10*3/uL (ref 150–450)
RBC: 4.46 x10E6/uL (ref 3.77–5.28)
RDW: 14 % (ref 11.7–15.4)
WBC: 6.2 10*3/uL (ref 3.4–10.8)

## 2023-06-09 LAB — SPECIMEN STATUS REPORT

## 2023-06-09 LAB — MAGNESIUM: Magnesium: 2.2 mg/dL (ref 1.6–2.3)

## 2023-06-09 LAB — D-DIMER, QUANTITATIVE: D-DIMER: <0.2 mg{FEU}/L (ref 0.00–0.49)

## 2023-06-20 ENCOUNTER — Telehealth: Payer: Self-pay

## 2023-06-20 DIAGNOSIS — R002 Palpitations: Secondary | ICD-10-CM

## 2023-06-20 NOTE — Telephone Encounter (Signed)
Please let Ms. Farar know that before placing another loop recorder we need to try to catch an episode on a cardiac monitor. Lets have her wear a 30 day monitor for palpitations, please remind her to press the alert on the monitor when she has palpitations.   Reather Littler NP  Called to discuss with patient possibility of 30 day monitor, Patient made me aware that they are allergic to the adhesives the monitor uses. Discussed with Reather Littler the possibility of starting a beta-blocker, patient notes history of hypotension on beta blockers or CCB, deferred repeat trial.  Patient requested to go back to see Dr. Graciela Husbands to discuss the loop recorder being re done. Patient recalled last time they had talked to Dr. Graciela Husbands he stated that they may need to redo another one if patient symptoms re occur. [facility] gave the okay to send a referral over to Dr. Graciela Husbands so that they may discuss their options together.

## 2023-06-22 ENCOUNTER — Other Ambulatory Visit: Payer: Self-pay | Admitting: Medical Genetics

## 2023-06-28 ENCOUNTER — Telehealth: Payer: Self-pay

## 2023-06-28 ENCOUNTER — Ambulatory Visit: Payer: 59 | Attending: Cardiology

## 2023-06-28 DIAGNOSIS — R002 Palpitations: Secondary | ICD-10-CM | POA: Diagnosis not present

## 2023-06-28 LAB — ECHOCARDIOGRAM COMPLETE
P 1/2 time: 514 ms
S' Lateral: 3.2 cm

## 2023-06-28 NOTE — Telephone Encounter (Signed)
Called patient advised of below they verbalized understanding.

## 2023-06-28 NOTE — Telephone Encounter (Signed)
-----   Message from Rip Harbour sent at 06/28/2023  1:22 PM EST ----- Please let Ms. Erica Ho know that her echo showed normal heart squeeze and function, EF 60 to 65%. There is mild to moderate leaking of the aortic valve. Overall similar to echo from 2023. We will discuss further at follow up.

## 2023-07-01 ENCOUNTER — Ambulatory Visit: Payer: Self-pay | Admitting: Cardiology

## 2023-07-11 NOTE — Progress Notes (Deleted)
 Cardiology Office Note    Date:  07/12/2023  ID:  JAQUELYNE FIRKUS, DOB [birth year], MRN [id number] PCP:  Erskine Emery, NP  Cardiologist:  Rollene Rotunda, MD  Electrophysiologist:  None   Chief Complaint: ***  History of Present Illness: .    Erica Ho is a 43 y.o. female with visit-pertinent history of atrial flutter/fib, history of PE, GERD and PACs.  She initially presented with dizziness following extensive workup at [facility], [facility] and [facility].  She was previously seen at [facility] cardiology and had a 30-day event monitor that showed PACs and no significant arrhythmia.  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   In 04/2016 due to chest pain and dyspnea a CTA was performed and showed a PE, she was started on anticoagulation.  She did negative poet for chest pain.  She previously had several heart monitors and ILR that showed no atrial fibrillation.  She has previously not tolerated beta-blocker or calcium channel blocker.  She has previously evaluated at [facility] which showed no evidence of an autonomic disorder.  She was last seen by Dr. Antoine Poche in 03/2022, she remained stable from a cardiac standpoint.  On 12/30/2022 she was evaluated by Dr. Flora Lipps for intense pain under her left shoulder blade, described as a squeezing pain that would last 3 to 5 minutes.  She went to [facility] and was ruled out for an acute coronary syndrome that weekend.  However she had recurrent symptoms that week and was seen by her PCP.  Per reports she had a CT PE study that was negative.  It was felt to be musculoskeletal in nature.  On 06/06/2023 patient notified the office that she was sitting at work and had a different feeling of palpitations in her normal.  She checked her watch and her heart rate went up to over 170 bpm.  Per patient reports she presented to a walk-in clinic, her EKG was normal and her heart rate decreased to 120 bpm.  Patient was seen in  clinic on 06/08/2023 regarding her palpitations.  She reported that the prior Friday she was sitting at work when she had a feeling that her heart was beating increasingly hard followed by fast heartbeats.  She checked her Apple Watch which indicated that her heart rate was over 170 bpm, she noted that as the day went on it slowly started to decrease.  However when she went to bed that night it was still around 100 bpm.  When she woke up the next morning her heart rate was back in her normal range around 70 bpm.  She reported that it was similar to how she felt in 2019, she reported compliance with her Xarelto.  Patient's labs were unremarkable including D-dimer and TSH.  Patient then reported another episode, cardiac monitor was offered, patient declined as she notes that every time she wears a cardiac monitor she has an allergic reaction.  Patient requested referral back to Dr. Graciela Husbands who she previously followed for her loop recorder, referral placed.  Echocardiogram on 06/28/2023 indicated LVEF of 60 to 65%, no RWMA, diastolic parameters were normal, right ventricular systolic function and size was normal.  There was normal pulmonary artery pressures.  There is mild thickening of the aortic valve with mild to moderate aortic valve regurgitation.  There is no aortic valve stenosis present.  Today she presents for follow-up.  She reports that she   Palpitations/PACs/?hx of atrial  flutter: History of PVCs/PACs, previous stent previously had ILR that showed no evidence of atrial fibrillation.  She has previously not tolerated beta-blocker or calcium channel blockers.  At last office visit patient reported palpitations similar to her episodes in 2019, she noted increased heart rate up to 170 bpm.  She reports that her heart rate slowly decreased throughout the day.  She currently still has a loop recorder implanted however the battery has run out.  Patient requested referral back to Dr. 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 Patient reports that she has been compliant with her Xarelto.  AI: Noted to be mild to moderate on echo on 06/28/2023.  There is no evidence of stenosis.    Labwork independently reviewed:   ROS: .   *** denies chest pain, shortness of breath, lower extremity edema, fatigue, palpitations, melena, hematuria, hemoptysis, diaphoresis, weakness, presyncope, syncope, orthopnea, and PND.  All other systems are reviewed and otherwise negative.  Studies Reviewed: Marland Kitchen    EKG:  EKG is not ordered today.   CV Studies:  Cardiac Studies & Procedures   ______________________________________________________________________________________________   STRESS TESTS  EXERCISE TOLERANCE TEST (ETT) 09/16/2016  Narrative  Blood pressure demonstrated a normal response to exercise.  There was no ST segment deviation noted during stress.  Negative exercise stress test for ischemia at an adequate heart rate of 94% MPHR  Good exercise tolerance at 11 mets.  This is a low risk study.   ECHOCARDIOGRAM  ECHOCARDIOGRAM COMPLETE 06/28/2023  Narrative ECHOCARDIOGRAM REPORT    Patient Name:   Erica Ho Date of Exam: 06/28/2023 Medical Rec #:  [id number]        Height:       61.0 in Accession #:    [id number]       Weight:       174.4 lb Date of Birth:  Apr 24, 1981        BSA:          1.782 m Patient Age:    43 years         BP:           124/62 mmHg Patient Gender: F                HR:           73 bpm. Exam Location:  [facility]  Procedure: 2D Echo, Cardiac Doppler, Color Doppler and Strain Analysis  Indications:    Palpitations [R00.2 (ICD-10-CM)]  History:        Patient has prior history of Echocardiogram examinations, most recent 03/24/2022. Arrythmias:Atrial Fibrillation; Signs/Symptoms:Chest Pain.  Sonographer:    Louie Boston RDCS Referring Phys: [id number] [facility]  D Taheem Fricke  IMPRESSIONS   1. Left ventricular ejection fraction, by estimation, is 60 to 65%. The left ventricle has normal function. The left ventricle has no regional wall motion abnormalities. Left ventricular diastolic parameters were normal. The average left ventricular global longitudinal strain is 23.3 %. The global longitudinal strain is normal. 2. Right ventricular systolic function is normal. The right ventricular size is normal. There is normal pulmonary artery systolic pressure. 3. The mitral valve is normal in structure. Trivial mitral valve regurgitation. No evidence of mitral stenosis. 4. VC 4 mm. The aortic valve is normal in structure. There is mild thickening of the aortic valve. Aortic valve regurgitation is mild to moderate. No aortic stenosis is present. 5. Aortic Normal DTA. 6. The  inferior vena cava is normal in size with greater than 50% respiratory variability, suggesting right atrial pressure of 3 mmHg.  FINDINGS Left Ventricle: Left ventricular ejection fraction, by estimation, is 60 to 65%. The left ventricle has normal function. The left ventricle has no regional wall motion abnormalities. The average left ventricular global longitudinal strain is 23.3 %. The global longitudinal strain is normal. The left ventricular internal cavity size was normal in size. There is no left ventricular hypertrophy. Left ventricular diastolic parameters were normal. Normal left ventricular filling pressure.  Right Ventricle: The right ventricular size is normal. No increase in right ventricular wall thickness. Right ventricular systolic function is normal. There is normal pulmonary artery systolic pressure. The tricuspid regurgitant velocity is 2.25 m/s, and with an assumed right atrial pressure of 3 mmHg, the estimated right ventricular systolic pressure is 23.2 mmHg.  Left Atrium: Left atrial size was normal in size.  Right Atrium: Right atrial size was normal in size.  Pericardium:  There is no evidence of pericardial effusion.  Mitral Valve: The mitral valve is normal in structure. Trivial mitral valve regurgitation. No evidence of mitral valve stenosis.  Tricuspid Valve: The tricuspid valve is normal in structure. Tricuspid valve regurgitation is mild . No evidence of tricuspid stenosis.  Aortic Valve: VC 4 mm. The aortic valve is normal in structure. There is mild thickening of the aortic valve. Aortic valve regurgitation is mild to moderate. Aortic regurgitation PHT measures 514 msec. No aortic stenosis is present.  Pulmonic Valve: The pulmonic valve was normal in structure. Pulmonic valve regurgitation is mild. No evidence of pulmonic stenosis.  Aorta: Normal DTA, the aortic root and ascending aorta are structurally normal, with no evidence of dilitation and the aortic arch was not well visualized.  Venous: A normal flow pattern is recorded from the right upper pulmonary vein. The inferior vena cava is normal in size with greater than 50% respiratory variability, suggesting right atrial pressure of 3 mmHg.  IAS/Shunts: No atrial level shunt detected by color flow Doppler.   LEFT VENTRICLE PLAX 2D LVIDd:         4.80 cm   Diastology LVIDs:         3.20 cm   LV e' medial:    9.46 cm/s LV PW:         0.80 cm   LV E/e' medial:  11.4 LV IVS:        0.80 cm   LV e' lateral:   14.50 cm/s LVOT diam:     1.80 cm   LV E/e' lateral: 7.4 LV SV:         61 LV SV Index:   34        2D Longitudinal Strain LVOT Area:     2.54 cm  2D Strain GLS Avg:     23.3 %   RIGHT VENTRICLE             IVC RV Basal diam:  2.60 cm     IVC diam: 1.80 cm RV S prime:     12.10 cm/s TAPSE (M-mode): 2.3 cm  LEFT ATRIUM             Index        RIGHT ATRIUM           Index LA diam:        3.30 cm 1.85 cm/m   RA Area:     14.00 cm LA Vol (A2C):   55.1 ml 30.92  ml/m  RA Volume:   32.80 ml  18.41 ml/m LA Vol (A4C):   51.6 ml 28.96 ml/m LA Biplane Vol: 55.7 ml 31.26 ml/m AORTIC  VALVE LVOT Vmax:   120.50 cm/s LVOT Vmean:  76.800 cm/s LVOT VTI:    0.239 m AI PHT:      514 msec  AORTA Ao Root diam: 2.90 cm Ao Asc diam:  3.00 cm Ao Desc diam: 2.40 cm  MV E velocity: 108.00 cm/s  TRICUSPID VALVE MV A velocity: 99.60 cm/s   TR Peak grad:   20.2 mmHg MV E/A ratio:  1.08         TR Vmax:        225.00 cm/s  SHUNTS Systemic VTI:  0.24 m Systemic Diam: 1.80 cm  Norman Herrlich MD Electronically signed by Norman Herrlich MD Signature Date/Time: 06/28/2023/12:43:05 PM    Final    MONITORS  CARDIAC EVENT MONITOR 12/31/2016  Narrative Indication:palpitations  Duration: s30d  Findings Symptoms Flutters sinus and occasionally PVC or PACs  Chet pain assoc with sinus  Shortness of breath assoc w Sinus  Seen and reviewed with pt by Dr [facility]       ______________________________________________________________________________________________       Current Reported Medications:.    No outpatient medications have been marked as taking for the 07/13/23 encounter (Appointment) with Rip Harbour, NP.    Physical Exam:    VS:  There were no vitals taken for this visit.   Wt Readings from Last 3 Encounters:  06/08/23 174 lb 6.4 oz (79.1 kg)  12/30/22 172 lb 6.4 oz (78.2 kg)  12/07/22 164 lb 14.5 oz (74.8 kg)    GEN: Well nourished, well developed in no acute distress NECK: No JVD; No carotid bruits CARDIAC: ***RRR, no murmurs, rubs, gallops RESPIRATORY:  Clear to auscultation without rales, wheezing or rhonchi  ABDOMEN: Soft, non-tender, non-distended EXTREMITIES:  No edema; No acute deformity   Asessement and Plan:.     ***     Disposition: F/u with ***  Signed, Rip Harbour, NP

## 2023-07-13 ENCOUNTER — Ambulatory Visit: Payer: 59 | Admitting: Cardiology

## 2023-07-13 ENCOUNTER — Telehealth: Payer: Self-pay | Admitting: Cardiology

## 2023-07-13 DIAGNOSIS — I351 Nonrheumatic aortic (valve) insufficiency: Secondary | ICD-10-CM

## 2023-07-13 DIAGNOSIS — Z86711 Personal history of pulmonary embolism: Secondary | ICD-10-CM

## 2023-07-13 DIAGNOSIS — R000 Tachycardia, unspecified: Secondary | ICD-10-CM

## 2023-07-13 DIAGNOSIS — R002 Palpitations: Secondary | ICD-10-CM

## 2023-07-13 NOTE — Telephone Encounter (Signed)
Called and spoke with patient regarding appointment this afternoon.  On chart review she had messaged office requesting if appointment would be needed as she has follow-up with Dr. Graciela Husbands regarding palpitations.  Patient has no new concerns or complaints today, reviewed her echocardiogram over the phone, all questions answered.  Given intermittent palpitations and plans to undergo possible loop recorder reimplantation patient prefers to follow-up with Dr. Graciela Husbands.  Will cancel appointment today, she has appointment with Dr. Graciela Husbands scheduled on 08/15/2023.  Patient will call to reschedule appointment with general cardiology if needed.

## 2023-08-14 ENCOUNTER — Other Ambulatory Visit: Payer: Self-pay

## 2023-08-14 ENCOUNTER — Emergency Department (HOSPITAL_COMMUNITY)

## 2023-08-14 ENCOUNTER — Encounter (HOSPITAL_COMMUNITY): Payer: Self-pay

## 2023-08-14 ENCOUNTER — Emergency Department (HOSPITAL_COMMUNITY)
Admission: EM | Admit: 2023-08-14 | Discharge: 2023-08-14 | Disposition: A | Discharge status: Home / Self Care | Attending: Emergency Medicine | Admitting: Emergency Medicine

## 2023-08-14 DIAGNOSIS — R42 Dizziness and giddiness: Secondary | ICD-10-CM | POA: Diagnosis present

## 2023-08-14 DIAGNOSIS — H81399 Other peripheral vertigo, unspecified ear: Secondary | ICD-10-CM | POA: Insufficient documentation

## 2023-08-14 LAB — CBC
HCT: 36.8 % (ref 36.0–46.0)
Hemoglobin: 12.2 g/dL (ref 12.0–15.0)
MCH: 28.8 pg (ref 26.0–34.0)
MCHC: 33.2 g/dL (ref 30.0–36.0)
MCV: 87 fL (ref 80.0–100.0)
Platelets: 246 10*3/uL (ref 150–400)
RBC: 4.23 MIL/uL (ref 3.87–5.11)
RDW: 14.3 % (ref 11.5–15.5)
WBC: 6.4 10*3/uL (ref 4.0–10.5)
nRBC: 0 % (ref 0.0–0.2)

## 2023-08-14 LAB — BASIC METABOLIC PANEL
Anion gap: 11 (ref 5–15)
BUN: 11 mg/dL (ref 6–20)
CO2: 24 mmol/L (ref 22–32)
Calcium: 9.1 mg/dL (ref 8.9–10.3)
Chloride: 105 mmol/L (ref 98–111)
Creatinine, Ser: 0.69 mg/dL (ref 0.44–1.00)
GFR, Estimated: >60 mL/min (ref >60)
Glucose, Bld: 100 mg/dL — ABNORMAL HIGH (ref 70–99)
Potassium: 3.6 mmol/L (ref 3.5–5.1)
Sodium: 140 mmol/L (ref 135–145)

## 2023-08-14 LAB — TROPONIN I (HIGH SENSITIVITY)
Troponin I (High Sensitivity): <2 ng/L (ref <18)
Troponin I (High Sensitivity): <2 ng/L (ref <18)

## 2023-08-14 LAB — HCG, SERUM, QUALITATIVE: Preg, Serum: NEGATIVE

## 2023-08-14 MED ORDER — GADOBUTROL 1 MMOL/ML IV SOLN
8.0000 mL | Freq: Once | INTRAVENOUS | Status: AC | PRN
  Administered 2023-08-14: 8 mL via INTRAVENOUS

## 2023-08-14 MED ORDER — MECLIZINE HCL 25 MG PO TABS: 25.0000 mg | ORAL_TABLET | Freq: Three times a day (TID) | ORAL | 0 refills | Status: AC | PRN

## 2023-08-14 NOTE — ED Notes (Signed)
 Patient transported to X-ray

## 2023-08-14 NOTE — ED Provider Notes (Signed)
 [place] EMERGENCY DEPARTMENT AT [facility] Provider Note   CSN: [id number] Arrival date & time: 08/14/23  1126     History  Chief Complaint  Patient presents with   Dizziness   Palpitations   Chest Pain    Erica Ho is a 43 y.o. female.   Dizziness Associated symptoms: chest pain and palpitations   Palpitations Associated symptoms: chest pain and dizziness   Chest Pain Associated symptoms: dizziness and palpitations   Patient presents with episode of dizziness.  Feels that the room was spinning around.  Had some palpitations.  Some chest pain with it.  Lasted around 10 minutes.  Had another episode a few days ago while she was standing.  Felt a little lightheaded but definitely felt that the the room was moving.  Did have some nausea.  Due to have a loop recorder placed by Dr. Graciela Husbands tomorrow.  No other numbness or weakness. Is on anticoagulation for previous pulmonary embolism.  Reportedly no clear cause of the clot was found.    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 05/2016: event monitor showing PAC's with one episode of 4 beats NSVT   History of migraine headaches    PE (pulmonary thromboembolism) (HCC)    a. 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 Authorizing Provider  meclizine (ANTIVERT) 25 MG tablet Take 1 tablet (25 mg total) by mouth 3 (three) times daily as needed for dizziness. 08/14/23  Yes Benjiman Core, MD  clonazePAM (KLONOPIN) 0.5 MG tablet Take 0.5 mg by mouth 2 (two) times daily. 01/03/17   [provider]  doxycycline (VIBRAMYCIN) 100 MG capsule Take 100 mg by mouth 2 (two) times daily. For acne    [provider]  ferrous  sulfate 325 (65 FE) MG EC tablet Take 325 mg by mouth daily with breakfast.     [provider]  fluticasone (FLONASE) 50 MCG/ACT nasal spray Place into both nostrils. 04/04/20   [provider]  magnesium oxide (MAG-OX) 400 (241.3 Mg) MG tablet Take 1 tablet (400 mg total) by mouth daily. 08/04/18   Duke Salvia, MD  rivaroxaban (XARELTO) 10 MG TABS tablet Take 1 tablet (10 mg total) by mouth daily. 08/13/22   Rollene Rotunda, MD  topiramate (TOPAMAX) 25 MG tablet Take 25 mg by mouth daily. Starts today Patient not taking: Reported on 06/08/2023    [provider]      Allergies    Imitrex [sumatriptan], Cardizem [diltiazem], Isovue [iopamidol], Levofloxacin, Nsaids, Other, and Tolmetin    Review of Systems   Review of Systems  Cardiovascular:  Positive for chest pain and palpitations.  Neurological:  Positive for dizziness.    Physical Exam Updated Vital Signs BP 111/74 (BP Location: Right Arm)   Pulse 66   Temp 98.1 F (36.7 C) (Oral)   Resp 16   Ht 5\' 1"  (1.549 m)   Wt 78.9 kg   LMP 08/08/2023 (Approximate)   SpO2 100%   BMI 32.88 kg/m  Physical Exam Vitals reviewed.  HENT:     Head: Atraumatic.  Eyes:  Comments: Mild nystagmus with end gaze.  Cardiovascular:     Rate and Rhythm: Normal rate and regular rhythm.  Pulmonary:     Breath sounds: No wheezing or rhonchi.  Chest:     Chest wall: No tenderness.  Abdominal:     Palpations: There is no hepatomegaly.  Musculoskeletal:     Right lower leg: No edema.     Left lower leg: No edema.  Skin:    General: Skin is warm.  Neurological:     Mental Status: She is alert.     Comments: Finger-nose intact bilaterally.  Symmetric face.  Mild nystagmus with gaze to left and right.  Conjugate eye movements.     ED Results / Procedures / Treatments   Labs (all labs ordered are listed, but only abnormal results are displayed) Labs Reviewed  BASIC METABOLIC PANEL - Abnormal; Notable for the  following components:      Result Value   Glucose, Bld 100 (*)    All other components within normal limits  CBC  HCG, SERUM, QUALITATIVE  TROPONIN I (HIGH SENSITIVITY)  TROPONIN I (HIGH SENSITIVITY)    EKG None  Radiology MR BRAIN WO CONTRAST Result Date: 08/14/2023 CLINICAL DATA:  Vertigo, central EXAM: MRI HEAD WITHOUT CONTRAST MRA HEAD WITHOUT CONTRAST MRA NECK WITHOUT AND WITH CONTRAST TECHNIQUE: Multiplanar, multi-echo pulse sequences of the brain and surrounding structures were acquired without intravenous contrast. Angiographic images of the Circle of Willis were acquired using MRA technique without intravenous contrast. Angiographic images of the neck were acquired using MRA technique without and with intravenous contrast. Carotid stenosis measurements (when applicable) are obtained utilizing NASCET criteria, using the distal internal carotid diameter as the denominator. CONTRAST:  8mL GADAVIST GADOBUTROL 1 MMOL/ML IV SOLN COMPARISON:  Head CT 11/14/2021 FINDINGS: MRI HEAD FINDINGS Brain: Diffusion imaging does not show any acute or subacute infarction or other cause of restricted diffusion. The brain appears otherwise normal without evidence of old stroke, mass, hemorrhage, hydrocephalus or extra-axial collection. Vascular: Major vessels at the base of the brain show flow. Skull and upper cervical spine: Negative Sinuses/Orbits: Clear/normal Other: None MRA HEAD FINDINGS Both internal carotid arteries are widely patent into the brain. No siphon stenosis. The anterior and middle cerebral vessels are patent without proximal stenosis, aneurysm or vascular malformation. Both vertebral arteries are widely patent to the basilar. No basilar stenosis. Posterior circulation branch vessels appear normal. MRA NECK FINDINGS Aortic arch: Normal Right carotid system: Normal.  Normal carotid bifurcation. Left carotid system: Normal.  Normal carotid bifurcation. Vertebral arteries: Both vertebral arteries  are normal, widely patent to the basilar artery. Other: None IMPRESSION: 1. Normal MRI of the brain. No acute or reversible finding. 2. Normal intracranial MR angiography of the large and medium size vessels. 3. Normal MR angiography of the large and medium size vessels of the neck. Electronically Signed   By: Paulina Fusi M.D.   On: 08/14/2023 15:18   MR Angiogram Neck W or Wo Contrast Result Date: 08/14/2023 CLINICAL DATA:  Vertigo, central EXAM: MRI HEAD WITHOUT CONTRAST MRA HEAD WITHOUT CONTRAST MRA NECK WITHOUT AND WITH CONTRAST TECHNIQUE: Multiplanar, multi-echo pulse sequences of the brain and surrounding structures were acquired without intravenous contrast. Angiographic images of the Circle of Willis were acquired using MRA technique without intravenous contrast. Angiographic images of the neck were acquired using MRA technique without and with intravenous contrast. Carotid stenosis measurements (when applicable) are obtained utilizing NASCET criteria, using the distal internal carotid diameter as the denominator.  CONTRAST:  8mL GADAVIST GADOBUTROL 1 MMOL/ML IV SOLN COMPARISON:  Head CT 11/14/2021 FINDINGS: MRI HEAD FINDINGS Brain: Diffusion imaging does not show any acute or subacute infarction or other cause of restricted diffusion. The brain appears otherwise normal without evidence of old stroke, mass, hemorrhage, hydrocephalus or extra-axial collection. Vascular: Major vessels at the base of the brain show flow. Skull and upper cervical spine: Negative Sinuses/Orbits: Clear/normal Other: None MRA HEAD FINDINGS Both internal carotid arteries are widely patent into the brain. No siphon stenosis. The anterior and middle cerebral vessels are patent without proximal stenosis, aneurysm or vascular malformation. Both vertebral arteries are widely patent to the basilar. No basilar stenosis. Posterior circulation branch vessels appear normal. MRA NECK FINDINGS Aortic arch: Normal Right carotid system:  Normal.  Normal carotid bifurcation. Left carotid system: Normal.  Normal carotid bifurcation. Vertebral arteries: Both vertebral arteries are normal, widely patent to the basilar artery. Other: None IMPRESSION: 1. Normal MRI of the brain. No acute or reversible finding. 2. Normal intracranial MR angiography of the large and medium size vessels. 3. Normal MR angiography of the large and medium size vessels of the neck. Electronically Signed   By: Paulina Fusi M.D.   On: 08/14/2023 15:18   MR ANGIO HEAD WO CONTRAST Result Date: 08/14/2023 CLINICAL DATA:  Vertigo, central EXAM: MRI HEAD WITHOUT CONTRAST MRA HEAD WITHOUT CONTRAST MRA NECK WITHOUT AND WITH CONTRAST TECHNIQUE: Multiplanar, multi-echo pulse sequences of the brain and surrounding structures were acquired without intravenous contrast. Angiographic images of the Circle of Willis were acquired using MRA technique without intravenous contrast. Angiographic images of the neck were acquired using MRA technique without and with intravenous contrast. Carotid stenosis measurements (when applicable) are obtained utilizing NASCET criteria, using the distal internal carotid diameter as the denominator. CONTRAST:  8mL GADAVIST GADOBUTROL 1 MMOL/ML IV SOLN COMPARISON:  Head CT 11/14/2021 FINDINGS: MRI HEAD FINDINGS Brain: Diffusion imaging does not show any acute or subacute infarction or other cause of restricted diffusion. The brain appears otherwise normal without evidence of old stroke, mass, hemorrhage, hydrocephalus or extra-axial collection. Vascular: Major vessels at the base of the brain show flow. Skull and upper cervical spine: Negative Sinuses/Orbits: Clear/normal Other: None MRA HEAD FINDINGS Both internal carotid arteries are widely patent into the brain. No siphon stenosis. The anterior and middle cerebral vessels are patent without proximal stenosis, aneurysm or vascular malformation. Both vertebral arteries are widely patent to the basilar. No  basilar stenosis. Posterior circulation branch vessels appear normal. MRA NECK FINDINGS Aortic arch: Normal Right carotid system: Normal.  Normal carotid bifurcation. Left carotid system: Normal.  Normal carotid bifurcation. Vertebral arteries: Both vertebral arteries are normal, widely patent to the basilar artery. Other: None IMPRESSION: 1. Normal MRI of the brain. No acute or reversible finding. 2. Normal intracranial MR angiography of the large and medium size vessels. 3. Normal MR angiography of the large and medium size vessels of the neck. Electronically Signed   By: Paulina Fusi M.D.   On: 08/14/2023 15:18   DG Chest 2 View Result Date: 08/14/2023 CLINICAL DATA:  Chest pain. EXAM: CHEST - 2 VIEW COMPARISON:  06/15/2023.  CT, 12/30/2022. FINDINGS: Normal heart, mediastinum and hila. Loop recorder projects over cardiac silhouette, stable. Clear lungs.  No pleural effusion or pneumothorax. Skeletal structures are unremarkable. IMPRESSION: No active cardiopulmonary disease. Electronically Signed   By: Amie Portland M.D.   On: 08/14/2023 12:04    Procedures Procedures    Medications Ordered in ED Medications  gadobutrol (GADAVIST) 1 MMOL/ML injection 8 mL (8 mLs Intravenous Contrast Given 08/14/23 1438)    ED Course/ Medical Decision Making/ A&P                                 Medical Decision Making Amount and/or Complexity of Data Reviewed Labs: ordered. Radiology: ordered.  Risk Prescription drug management.   Patient with vertiginous symptoms.  Last around 10 minutes.  Improved now.  Has been feeling some palpitations recently.  However has had previous hypercoagulability with pulmonary embolisms and is on blood thinners.  I think with the previous clots and puts her at higher risk for central cause of the vertigo.  Will get basic blood work.  However I think will benefit from MRI to evaluate both the vessels and to the brain.  Symptoms improved and not a TNK candidate at this  time.  MRI negative.  Likely peripheral vertigo.  Can follow-up with PCP.  Will discharge home        Final Clinical Impression(s) / ED Diagnoses Final diagnoses:  Peripheral vertigo, unspecified laterality    Rx / DC Orders ED Discharge Orders          Ordered    meclizine (ANTIVERT) 25 MG tablet  3 times daily PRN        08/14/23 1540              Benjiman Core, MD 08/14/23 2140

## 2023-08-14 NOTE — ED Notes (Signed)
Patient placed onto cardiac monitor

## 2023-08-14 NOTE — ED Notes (Signed)
 Pt returned from MRI

## 2023-08-14 NOTE — ED Triage Notes (Signed)
 Pt bib [place] ems from home c.o sudden onset of dizziness (felt the room spinning), palpitations and chest pain that radiates to her back. This happened around 10 am lasting about 10 mins. Pt reports a similar episode last Wednesday but not as severe. Pt is scheduled to get a loop recorder placed tomorrow with Dr Graciela Husbands due to intermittent palpations lately.

## 2023-08-14 NOTE — ED Notes (Signed)
 Patient transported to MRI

## 2023-08-15 ENCOUNTER — Encounter: Payer: Self-pay | Admitting: Internal Medicine

## 2023-08-15 ENCOUNTER — Other Ambulatory Visit: Payer: Self-pay | Admitting: Family

## 2023-08-15 ENCOUNTER — Ambulatory Visit: Payer: 59 | Attending: Internal Medicine | Admitting: Internal Medicine

## 2023-08-15 VITALS — BP 108/66 | HR 71 | Ht 61.0 in | Wt 177.0 lb

## 2023-08-15 DIAGNOSIS — R002 Palpitations: Secondary | ICD-10-CM | POA: Diagnosis not present

## 2023-08-15 DIAGNOSIS — Z1231 Encounter for screening mammogram for malignant neoplasm of breast: Secondary | ICD-10-CM

## 2023-08-15 NOTE — Patient Instructions (Signed)
 Medication Instructions:  Your physician recommends that you continue on your current medications as directed. Please refer to the Current Medication list given to you today.  *If you need a refill on your cardiac medications before your next appointment, please call your pharmacy*   Lab Work: None ordered.  If you have labs (blood work) drawn today and your tests are completely normal, you will receive your results only by: MyChart Message (if you have MyChart) OR A paper copy in the mail If you have any lab test that is abnormal or we need to change your treatment, we will call you to review the results.   Testing/Procedures: None ordered.    Follow-Up: At [facility], you and your health needs are our priority.  As part of our continuing mission to provide you with exceptional heart care, we have created designated Provider Care Teams.  These Care Teams include your primary Cardiologist (physician) and Advanced Practice Providers (APPs -  Physician Assistants and Nurse Practitioners) who all work together to provide you with the care you need, when you need it.  We recommend signing up for the patient portal called "MyChart".  Sign up information is provided on this After Visit Summary.  MyChart is used to connect with patients for Virtual Visits (Telemedicine).  Patients are able to view lab/test results, encounter notes, upcoming appointments, etc.  Non-urgent messages can be sent to your provider as well.   To learn more about what you can do with MyChart, go to [email or web address].    Your next appointment:  12 months       Implantable Loop Recorder Removal, Care After This sheet gives you information about how to care for yourself after your procedure. Your health care provider may also give you more specific instructions. If you have problems or questions, contact your health care provider. What can I expect after the procedure? After the procedure, it is  common to have: Soreness or discomfort near the incision. Some swelling or bruising near the incision.  Follow these instructions at home: Incision care  Monitor your cardiac device site for redness, swelling, and drainage. Call the device clinic at [phone] if you experience these symptoms or fever/chills.  You may shower tomorrow with the large bandage in place.                                                                                                                                                                                     Keep the large square bandage on your site until Thursday and then you may remove it yourself.         Keep the steri-strips underneath in  place.  They will usually fall off on their own, or may be removed       after 10 days. Pat dry.   Avoid lotions, ointments, or perfumes over your incision until it is well-healed.  Please do not submerge in water until your site is completely healed.   If your wound site starts to bleed apply pressure.       If you have any questions/concerns please call the device clinic at [phone].  Activity  Return to your normal activities.  Contact a health care provider if: You have redness, swelling, or pain around your incision. You have a fever.      Implantable Loop Recorder Placement, Care After This sheet gives you information about how to care for yourself after your procedure. Your health care provider may also give you more specific instructions. If you have problems or questions, contact your health care provider. What can I expect after the procedure? After the procedure, it is common to have: Soreness or discomfort near the incision. Some swelling or bruising near the incision.  Follow these instructions at home: Incision care  Monitor your cardiac device site for redness, swelling, and drainage. Call the device clinic at [phone] if you experience these symptoms or  fever/chills.  Keep the large square bandage on your site until Thursday and then you may remove it yourself. Keep the steri-strips underneath in place.   They will usually fall off on their own, or may be removed after 10 days. Pat dry.    You may shower tomorrow with the large bandage in place.  Avoid lotions, ointments, or perfumes over your incision until it is well-healed.  Please do not submerge in water until your site is completely healed.   Your device is MRI compatible.   Remote monitoring is used to monitor your cardiac device from home. This monitoring is scheduled every month by our office. It allows [place] to keep an eye on the function of your device to ensure it is working properly.  If your wound site starts to bleed apply pressure.    For help with the monitor please call [employer] Monitor Support Specialist directly at [phone].    If you have any questions/concerns please call the device clinic at [phone].  Activity  Return to your normal activities.  General instructions Follow instructions from your health care provider about how to manage your implantable loop recorder and transmit the information. Learn how to activate a recording if this is necessary for your type of device. You may go through a metal detection gate, and you may let someone hold a metal detector over your chest. Show your ID card if needed. Do not have an MRI unless you check with your health care provider first. Take over-the-counter and prescription medicines only as told by your health care provider. Keep all follow-up visits as told by your health care provider. This is important. Contact a health care provider if: You have redness, swelling, or pain around your incision. You have a fever. You have pain that is not relieved by your pain medicine. You have triggered your device because of fainting (syncope) or because of a heartbeat that feels like it is racing, slow, fluttering, or  skipping (palpitations). Get help right away if you have: Chest pain. Difficulty breathing. Summary After the procedure, it is common to have soreness or discomfort near the incision. Change your dressing as told by your health care provider. Follow instructions from your health care provider  about how to manage your implantable loop recorder and transmit the information. Keep all follow-up visits as told by your health care provider. This is important. This information is not intended to replace advice given to you by your health care provider. Make sure you discuss any questions you have with your health care provider. Document Released: 04/28/2015 Document Revised: 07/02/2017 Document Reviewed: 07/02/2017 Elsevier Patient Education  2020 [employer].

## 2023-08-15 NOTE — Progress Notes (Signed)
 ELECTROPHYSIOLOGY CONSULT NOTE  Patient ID: Erica Ho, MRN: [id number], DOB/AGE: [birth year] 43 y.o. Admit date: (Not on file) Date of Consult: 08/15/2023  Primary Physician: Erskine Emery, NP Primary Cardiologist: [name] is a 43 y.o. female who is being seen today for the evaluation of PALPIATIONS at the request of Erica Ho [facility].    HPI Erica Ho is a 43 y.o. female seen to reestablish after many years.  She has been extensively worked up at [facility], seen at [facility], seen here with complaints of palpitations  Event recorder previously implanted--now on past EOL.  To her 165 or so symptom episodes.  Many of them are reviewed.  All of them show gradual changes in rate there is a couple where the change in rate was not detected so always always a fast rate but there were P waves evident.  In the last couple of months, after having however few palpitations for period of time, she ended up in the [place] urgent care with a different type of episode where she felt like her heart was pounding for a few beats prior to it racing.  She was given carotid massage with some improvement.  She is here to have her loop recorder explanted and a new one implanted  The patient denies chest pain, shortness of breath, nocturnal dyspnea, orthopnea or peripheral edema.  There have been no syncope.  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 05/2016: event monitor showing PAC's with one episode of 4  beats NSVT   History of migraine headaches    PE (pulmonary thromboembolism) (HCC)    a. 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   Varicose veins       Surgical History:  Past Surgical History:  Procedure Laterality Date   CESAREAN SECTION     CHOLECYSTECTOMY     ENDOVENOUS ABLATION SAPHENOUS VEIN W/ LASER Left 12/15/2016   endovenous laser ablation L GSV and stab phlebectomy > 20 incisions left leg by Josephina Gip MD    LOOP RECORDER INSERTION N/A 09/30/2017   Procedure: LOOP RECORDER INSERTION;  Surgeon: Duke Salvia, MD;  Location: [facility] INVASIVE CV LAB;  Service: Cardiovascular;  Laterality: N/A;   MASS EXCISION Right 12/15/2022   Procedure: MINOR EXCISION OF RIGHT THUMB DIGITAL MUCOUSCYST;  Surgeon: Marlyne Beards, MD;  Location: [facility];  Service: Orthopedics;  Laterality: Right;  43   TONSILLECTOMY     TUBAL LIGATION     WISDOM TOOTH EXTRACTION       Home Meds: Current Meds  Medication Sig   clonazePAM (KLONOPIN) 0.5 MG tablet Take 0.5 mg by mouth 2 (two) times daily.   doxycycline (VIBRAMYCIN) 100 MG capsule Take 100 mg by mouth 2 (two) times daily. For  acne   ferrous sulfate 325 (65 FE) MG EC tablet Take 325 mg by mouth daily with breakfast.    fluticasone (FLONASE) 50 MCG/ACT nasal spray Place into both nostrils.   magnesium oxide (MAG-OX) 400 (241.3 Mg) MG tablet Take 1 tablet (400 mg total) by mouth daily.   meclizine (ANTIVERT) 25 MG tablet Take 1 tablet (25 mg total) by mouth 3 (three) times daily as needed for dizziness.   rivaroxaban (XARELTO) 10 MG TABS tablet Take 1 tablet (10 mg total) by mouth daily.   topiramate (TOPAMAX) 25 MG tablet Take 25 mg by mouth daily. Starts today    Allergies:  Allergies  Allergen Reactions   Imitrex [Sumatriptan] Palpitations   Cardizem [Diltiazem] Other (See Comments)    Caused blood pressure to drop   Isovue [Iopamidol] Other (See Comments)    Couldn't focus eyes well afterwards; things looked like they had  shadows   Levofloxacin Other (See Comments)    Skin felt like it was on "fire"   Nsaids Other (See Comments)    Cannot take because she is taking Xarelto now    Other Palpitations    STEROID INJECTION-PATIENT UNSURE OF NAME OF INJECTION- CAUSED RAPID HEART RATE   Tolmetin Other (See Comments)    Cannot take because she is taking Xarelto now    Social History   Socioeconomic History   Marital status: Married    Spouse name: Not on file   Number of children: 2   Years of education: Not on file   Highest education level: Not on file  Occupational History   Occupation: Conservation officer, nature  Tobacco Use   Smoking status: Former    Current packs/day: 0.00    Types: Cigarettes    Quit date: 08/26/2011    Years since quitting: 11.9   Smokeless tobacco: Never   Tobacco comments:    Smoked about 12 years, off and on  Vaping Use   Vaping status: Never Used  Substance and Sexual Activity   Alcohol use: No    Alcohol/week: 0.0 standard drinks of alcohol   Drug use: No   Sexual activity: Yes    Partners: Male  Other Topics Concern   Not on file  Social History Narrative   Not on file   Social Drivers of Health   Financial Resource Strain: Not on file  Food Insecurity: Not on file  Transportation Needs: Not on file  Physical Activity: Not on file  Stress: Not on file  Social Connections: Not on file  Intimate Partner Violence: Not on file     Family History  Problem Relation Age of Onset   Diabetes Mother    Emphysema Mother    Cancer Father    Lupus Daughter    Breast cancer Maternal Grandmother    Clotting disorder Neg Hx      ROS:  Please see the history of present illness.     All other systems reviewed and negative.    Physical Exam: Blood pressure 108/66, pulse 71, height 5\' 1"  (1.549 m), weight 177 lb (80.3 kg), last menstrual period 08/08/2023. General: Well developed, well nourished female in no acute distress. Head: Normocephalic, atraumatic, sclera non-icteric, no  xanthomas, nares are without discharge. EENT: normal  Lymph Nodes:  none Neck: Negative for carotid bruits. JVD not elevated. Back:without scoliosis kyphosis Lungs: Clear bilaterally to auscultation without wheezes, rales, or rhonchi. Breathing is unlabored. Heart: RRR with S1 S2. No murmur . No rubs, or gallops appreciated. Abdomen: Soft,  non-tender, non-distended with normoactive bowel sounds. No hepatomegaly. No rebound/guarding. No obvious abdominal masses. Msk:  Strength and tone appear normal for age. Extremities: No clubbing or cyanosis. No  edema.  Distal pedal pulses are 2+ and equal bilaterally. Skin: Warm and Dry Neuro: Alert and oriented X 3. CN III-XII intact Grossly normal sensory and motor function . Psych:  Responds to questions appropriately with a normal affect.   Unable to palpate the monitor.  Efforts to identify under echo were also unsuccessful.     EKG: Sinus at 71 Interval 16/09/39   Assessment and Plan:  Palpitations with previously identified sinus mechanism but now with perhaps a different mechanism  Previously implanted loop recorder at EOL  DVT/PE on chronic anticoagulation   The patient is here for monitor.  Also unsealed when explanted.  Been unable to palpate,  able to identify echocardiographically.  JENELLE DRENNON [id number]  [id number]  Pre op Dx previously implanted loop recorder at EOS Post op Dx Same  Procedure  Loop Recorder explantation and reimplantation DESCRIPTION OF PROCEDURE:  Informed written consent was obtained.   Time Out Completed with RN   After routine prep and drape of the left parasternal area, a small incision was created.  The previously implanted loop was retrieved.  Through the same incision the next loop recorder was inserted A Medtronic LINQ Reveal Loop Recorder  Serial Number  [id number] was inserted.    SteriStrip dressing was  applied.  The patient tolerated the procedure without apparent  complication.  EBL < 10cc      Sherryl Manges, MD 08/15/2023 9:50 AM    Sherryl Manges

## 2023-09-02 ENCOUNTER — Telehealth: Payer: Self-pay

## 2023-09-02 NOTE — Telephone Encounter (Signed)
 Alert received from [employer] for 1 Symptom event on 09/01/23 at 18:00, EGM c/w SR with atrial driven tachycardia up to 150's, Symptom Details: Heart pounding  A little active  Fast heart rate. Implant indication of Palpitations, first Symptom event, routed to clinic for review.   Routing to Dr. Graciela Husbands to review since first symptom since implant on 08/15/23.

## 2023-09-19 ENCOUNTER — Ambulatory Visit

## 2023-09-19 DIAGNOSIS — R002 Palpitations: Secondary | ICD-10-CM

## 2023-09-20 LAB — CUP PACEART REMOTE DEVICE CHECK
Date Time Interrogation Session: 20250421103624
Implantable Pulse Generator Implant Date: 20250317

## 2023-10-25 ENCOUNTER — Ambulatory Visit (INDEPENDENT_AMBULATORY_CARE_PROVIDER_SITE_OTHER)

## 2023-10-25 DIAGNOSIS — R002 Palpitations: Secondary | ICD-10-CM | POA: Diagnosis not present

## 2023-10-25 LAB — CUP PACEART REMOTE DEVICE CHECK
Date Time Interrogation Session: 20250526233559
Implantable Pulse Generator Implant Date: 20250317

## 2023-10-30 ENCOUNTER — Ambulatory Visit: Payer: Self-pay | Admitting: Cardiology

## 2023-11-08 ENCOUNTER — Encounter

## 2023-11-09 NOTE — Addendum Note (Signed)
 Addended by: Edra Govern D on: 11/09/2023 12:59 PM   Modules accepted: Orders

## 2023-11-09 NOTE — Progress Notes (Signed)
 Care[facility] Summary Report / Loop Recorder

## 2023-11-21 ENCOUNTER — Ambulatory Visit (HOSPITAL_BASED_OUTPATIENT_CLINIC_OR_DEPARTMENT_OTHER): Admitting: Radiology

## 2023-11-21 ENCOUNTER — Ambulatory Visit (HOSPITAL_BASED_OUTPATIENT_CLINIC_OR_DEPARTMENT_OTHER)
Admission: EM | Admit: 2023-11-21 | Discharge: 2023-11-21 | Disposition: A | Discharge status: Home / Self Care | Attending: Family Medicine | Admitting: Family Medicine

## 2023-11-21 ENCOUNTER — Encounter (HOSPITAL_BASED_OUTPATIENT_CLINIC_OR_DEPARTMENT_OTHER): Payer: Self-pay | Admitting: Emergency Medicine

## 2023-11-21 DIAGNOSIS — M79605 Pain in left leg: Secondary | ICD-10-CM

## 2023-11-21 DIAGNOSIS — M7989 Other specified soft tissue disorders: Secondary | ICD-10-CM

## 2023-11-21 DIAGNOSIS — Z86711 Personal history of pulmonary embolism: Secondary | ICD-10-CM

## 2023-11-21 NOTE — Discharge Instructions (Signed)
 Ultrasound is negative for any DVTs nor superficial thrombi.  Use OTC ibuprofen, as directed on the package, as needed for pain.  Encouraged to reconnect with her vein specialist for further workup.  I believe this is probably a mild phlebitis or inflammation of a varicose vein.  I believe ibuprofen would be helpful.  Follow-up as needed.

## 2023-11-21 NOTE — ED Triage Notes (Signed)
 Pt reports she has a history of superficial clots, she noticed 2 days ago that she has a knot to the back of her left leg.

## 2023-11-21 NOTE — ED Provider Notes (Signed)
 [facility]    CSN: [id number] Arrival date & time: 11/21/23  1029      History   Chief Complaint Chief Complaint  Patient presents with   leg knot    HPI Erica Ho is a 43 y.o. female.   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  She has had a pulmonary embolus in the past.  She is on Xarelto  10 mg daily.  At 1 point she was on Xarelto  20 mg daily but her dose was reduced.  She noticed pain and swelling in her left leg near the popliteal fossa medially.  It is a nodule or not and it is tender to touch and it hurts when she walks.  She thinks it is a superficial clot.     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 05/2016: event monitor showing PAC's with one episode of 4 beats NSVT   History of migraine headaches    PE (pulmonary thromboembolism) (HCC)    a. 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    Varicose veins     Patient Active Problem List   Diagnosis Date Noted   Aortic valve regurgitation 04/26/2021   Precordial chest pain 06/13/2019   Educated about COVID-19 virus infection 06/13/2019   Thrombophlebitis 05/03/2018   Varicose veins of left lower extremity with complications 11/30/2016   Family history of autoimmune disorder 06/21/2016   History of pulmonary embolism 05/26/2016   Panic disorder 05/19/2016   Anemia 05/17/2016   History of migraine headaches 05/04/2016   Acute pulmonary embolism (HCC) 05/03/2016   Palpitations 05/03/2016   Ocular migraine 04/20/2016   Gastroesophageal reflux disease without esophagitis 04/20/2016   Varicose veins of left lower extremity with pain 09/18/2015   Atrial fibrillation (HCC) 06/09/2015   Hyperthyroidism 06/09/2015   Electrolyte disorder 06/09/2015   Dizziness 06/09/2015   Sinus  tachycardia 06/09/2015   Atrial flutter (HCC) 06/09/2015    Past Surgical History:  Procedure Laterality Date   CESAREAN SECTION     CHOLECYSTECTOMY     ENDOVENOUS ABLATION SAPHENOUS VEIN W/ LASER Left 12/15/2016   endovenous laser ablation L GSV and stab phlebectomy > 20 incisions left leg by Lynwood Collum MD    LOOP RECORDER INSERTION N/A 09/30/2017   Procedure: LOOP RECORDER INSERTION;  Surgeon: Fernande Elspeth JAYSON, MD;  Location: [facility] INVASIVE CV LAB;  Service: Cardiovascular;  Laterality: N/A;   MASS EXCISION Right 12/15/2022   Procedure: MINOR EXCISION OF RIGHT THUMB DIGITAL MUCOUSCYST;  Surgeon: Romona Harari, MD;  Location: [facility];  Service: Orthopedics;  Laterality: Right;  43   TONSILLECTOMY     TUBAL LIGATION     WISDOM TOOTH EXTRACTION      OB History   No obstetric history on file.      Home Medications    Prior to Admission medications   Medication Sig Start Date End Date Taking? Authorizing Provider  rivaroxaban  (XARELTO ) 10 MG TABS tablet Take 1 tablet (10 mg total) by mouth daily. 08/13/22  Yes Lavona Lynwood, MD  topiramate (TOPAMAX) 25 MG tablet Take 25 mg by mouth daily. Starts today   Yes [provider]  clonazePAM (KLONOPIN) 0.5 MG tablet Take 0.5 mg  by mouth 2 (two) times daily. 01/03/17   [provider]  doxycycline (VIBRAMYCIN) 100 MG capsule Take 100 mg by mouth 2 (two) times daily. For acne    [provider]  ferrous sulfate 325 (65 FE) MG EC tablet Take 325 mg by mouth daily with breakfast.     [provider]  fluticasone (FLONASE) 50 MCG/ACT nasal spray Place into both nostrils. 04/04/20   [provider]  magnesium  oxide (MAG-OX) 400 (241.3 Mg) MG tablet Take 1 tablet (400 mg total) by mouth daily. 08/04/18   Fernande Elspeth BROCKS, MD  meclizine  (ANTIVERT ) 25 MG tablet Take 1 tablet (25 mg total) by mouth 3 (three) times daily as needed for dizziness. 08/14/23   Patsey Lot, MD    Family  History Family History  Problem Relation Age of Onset   Diabetes Mother    Emphysema Mother    Cancer Father    Lupus Daughter    Breast cancer Maternal Grandmother    Clotting disorder Neg Hx     Social History Social History   Tobacco Use   Smoking status: Former    Current packs/day: 0.00    Types: Cigarettes    Quit date: 08/26/2011    Years since quitting: 12.2   Smokeless tobacco: Never   Tobacco comments:    Smoked about 12 years, off and on  Vaping Use   Vaping status: Never Used  Substance Use Topics   Alcohol use: No    Alcohol/week: 0.0 standard drinks of alcohol   Drug use: No     Allergies   Imitrex [sumatriptan], Cardizem  [diltiazem ], Isovue  [iopamidol ], Levofloxacin, Nsaids, Other, and Tolmetin   Review of Systems Review of Systems  Constitutional:  Negative for fever.  Respiratory:  Negative for cough.   Cardiovascular:  Negative for chest pain.  Gastrointestinal:  Negative for abdominal pain, constipation, diarrhea, nausea and vomiting.  Musculoskeletal:  Positive for myalgias (Pain and swelling in the left posterior leg). Negative for arthralgias and back pain.  Skin:  Negative for color change and rash.  Neurological:  Negative for syncope.  All other systems reviewed and are negative.    Physical Exam Triage Vital Signs ED Triage Vitals  Encounter Vitals Group     BP 11/21/23 1312 123/79     Girls Systolic BP Percentile --      Girls Diastolic BP Percentile --      Boys Systolic BP Percentile --      Boys Diastolic BP Percentile --      Pulse Rate 11/21/23 1312 64     Resp 11/21/23 1312 18     Temp 11/21/23 1312 97.8 F (36.6 C)     Temp Source 11/21/23 1312 Oral     SpO2 11/21/23 1312 98 %     Weight --      Height --      Head Circumference --      Peak Flow --      Pain Score 11/21/23 1311 2     Pain Loc --      Pain Education --      Exclude from Growth Chart --    No data found.  Updated Vital Signs BP 123/79 (BP  Location: Right Arm)   Pulse 64   Temp 97.8 F (36.6 C) (Oral)   Resp 18   LMP 10/31/2023   SpO2 98%   Visual Acuity Right Eye Distance:   Left Eye Distance:   Bilateral Distance:  Right Eye Near:   Left Eye Near:    Bilateral Near:     Physical Exam Vitals and nursing note reviewed.  Constitutional:      General: She is not in acute distress.    Appearance: She is well-developed. She is not ill-appearing or toxic-appearing.  HENT:     Head: Normocephalic and atraumatic.     Right Ear: External ear normal.     Left Ear: External ear normal.     Nose: Nose normal.     Mouth/Throat:     Lips: Pink.     Mouth: Mucous membranes are moist.   Eyes:     Conjunctiva/sclera: Conjunctivae normal.     Pupils: Pupils are equal, round, and reactive to light.    Cardiovascular:     Rate and Rhythm: Normal rate and regular rhythm.     Heart sounds: S1 normal and S2 normal. No murmur heard. Pulmonary:     Effort: Pulmonary effort is normal. No respiratory distress.     Breath sounds: Normal breath sounds. No decreased breath sounds, wheezing, rhonchi or rales.   Musculoskeletal:        General: No swelling.     Right hip: Normal.     Left hip: Normal.     Right upper leg: Normal.     Left upper leg: Normal.     Right knee: Normal.     Left knee: Swelling (At the popliteal fossa, medially.  A 2 cm ropey hard tender area under the skin.) present.     Right lower leg: Normal.     Left lower leg: Normal.   Skin:    General: Skin is warm and dry.     Capillary Refill: Capillary refill takes less than 2 seconds.     Findings: No rash.   Neurological:     Mental Status: She is alert and oriented to person, place, and time.   Psychiatric:        Mood and Affect: Mood normal.      UC Treatments / Results  Labs (all labs ordered are listed, but only abnormal results are displayed) Labs Reviewed - No data to display  EKG   Radiology US  Venous Img Lower Unilateral  Left (DVT) Result Date: 11/21/2023 CLINICAL DATA:  Left lower extremity pain. EXAM: LEFT LOWER EXTREMITY VENOUS DOPPLER ULTRASOUND TECHNIQUE: Gray-scale sonography with graded compression, as well as color Doppler and duplex ultrasound were performed to evaluate the lower extremity deep venous systems from the level of the common femoral vein and including the common femoral, femoral, profunda femoral, popliteal and calf veins including the posterior tibial, peroneal and gastrocnemius veins when visible. The superficial great saphenous vein was also interrogated. Spectral Doppler was utilized to evaluate flow at rest and with distal augmentation maneuvers in the common femoral, femoral and popliteal veins. COMPARISON:  None Available. FINDINGS: Contralateral Common Femoral Vein: Respiratory phasicity is normal and symmetric with the symptomatic side. No evidence of thrombus. Normal compressibility. Common Femoral Vein: No evidence of thrombus. Normal compressibility, respiratory phasicity and response to augmentation. Saphenofemoral Junction: No evidence of thrombus. Normal compressibility and flow on color Doppler imaging. Profunda Femoral Vein: No evidence of thrombus. Normal compressibility and flow on color Doppler imaging. Femoral Vein: No evidence of thrombus. Normal compressibility, respiratory phasicity and response to augmentation. Popliteal Vein: No evidence of thrombus. Normal compressibility, respiratory phasicity and response to augmentation. Calf Veins: No evidence of thrombus. Normal compressibility and flow on color Doppler imaging. Superficial  Great Saphenous Vein: No evidence of thrombus. Normal compressibility. Venous Reflux:  None. Other Findings: No evidence of superficial thrombophlebitis or abnormal fluid collection. IMPRESSION: No evidence of left lower extremity deep venous thrombosis. Electronically Signed   By: Marcey Moan M.D.   On: 11/21/2023 14:22    Procedures Procedures  (including critical care time)  Medications Ordered in UC Medications - No data to display  Initial Impression / Assessment and Plan / UC Course  I have reviewed the triage vital signs and the nursing notes.  Pertinent labs & imaging results that were available during my care of the patient were reviewed by me and considered in my medical decision making (see chart for details).  Plan of Care: Leg pain: Ultrasound was negative for any DVTs nor superficial thrombi.  This could be mild phlebitis.  Use OTC ibuprofen or acetaminophen , as directed on the package, as needed for pain and inflammation.  Make an appointment and follow-up with vein specialist.  Follow-up here as needed.  I reviewed the plan of care with the patient and/or the patient's guardian.  The patient and/or guardian had time to ask questions and acknowledged that the questions were answered.  I provided instruction on symptoms or reasons to return here or to go to an ER, if symptoms/condition did not improve, worsened or if new symptoms occurred.  Final Clinical Impressions(s) / UC Diagnoses   Final diagnoses:  Personal history of pulmonary embolism  Left leg swelling  Left leg pain     Discharge Instructions      Ultrasound is negative for any DVTs nor superficial thrombi.  Use OTC ibuprofen, as directed on the package, as needed for pain.  Encouraged to reconnect with her vein specialist for further workup.  I believe this is probably a mild phlebitis or inflammation of a varicose vein.  I believe ibuprofen would be helpful.  Follow-up as needed.     ED Prescriptions   None    PDMP not reviewed this encounter.   Ival Domino, FNP 11/21/23 1434

## 2023-11-22 ENCOUNTER — Other Ambulatory Visit: Payer: Self-pay

## 2023-11-22 ENCOUNTER — Telehealth: Payer: Self-pay

## 2023-11-22 DIAGNOSIS — I83812 Varicose veins of left lower extremities with pain: Secondary | ICD-10-CM

## 2023-11-22 NOTE — Telephone Encounter (Signed)
 Pt has been having ongoing swelling in LLE for a few years. She started to have acute pain in LLE 3 days ago and went to [facility] DVT study. She is calling to schedule f/u with MD to discuss next steps, as they discussed previously doing another SV ablation with stab phlebectomies at her last visit. Pt has been scheduled for reflux study and to see MD.

## 2023-11-23 ENCOUNTER — Telehealth: Payer: Self-pay | Admitting: *Deleted

## 2023-11-23 NOTE — Telephone Encounter (Signed)
 Device alert received from [employer]:  Alert remote transmission: Symptom Symptom activation 6/24 @ 07:48, EGM c/w NCT, rate ~150 - route to triage Follow up as scheduled  ____________________________________________________________________________________________  [place] and spoke with patient regarding transmission reviewed  Patient denied experiencing any symptoms at time of call  Patient stated she was sitting in her recliner petting her dog at time of event and said it felt like her heart was trying to jump out of my chest.  Patient explained that she has been on Cardizem  and Metoprolol  per Dr. Lavona in the past, but that both medications caused her BP to drop too much so they were discontinued.  Routing to MD for review and awareness   EGM BELOW IS NOT THE COMPLETE RECORDING FROM BEGINNING BUT DOES INCLUDE PRESENTING RHYTHM, THE TRANSITION INTO FASTER RHYTHM, SYMPTOM ACTIVATION, AND END OF RECORDING. SEE CARELINK FOR COMPLETE RECORDING IF DESIRED

## 2023-11-24 ENCOUNTER — Ambulatory Visit

## 2023-11-24 DIAGNOSIS — R000 Tachycardia, unspecified: Secondary | ICD-10-CM

## 2023-11-24 LAB — CUP PACEART REMOTE DEVICE CHECK
Date Time Interrogation Session: 20250625235058
Implantable Pulse Generator Implant Date: 20250317

## 2023-11-27 ENCOUNTER — Ambulatory Visit: Payer: Self-pay | Admitting: Cardiology

## 2023-11-28 ENCOUNTER — Encounter

## 2023-12-09 ENCOUNTER — Ambulatory Visit (INDEPENDENT_AMBULATORY_CARE_PROVIDER_SITE_OTHER)
Admission: RE | Admit: 2023-12-09 | Discharge: 2023-12-09 | Disposition: A | Discharge status: Home / Self Care | Source: Ambulatory Visit | Attending: Family | Admitting: Family

## 2023-12-09 DIAGNOSIS — Z1231 Encounter for screening mammogram for malignant neoplasm of breast: Secondary | ICD-10-CM | POA: Diagnosis not present

## 2023-12-15 NOTE — Telephone Encounter (Signed)
 Contacted pt, verified pt's name and DOB.  Pt states that she is trying to figure out why her visit for 10/12/23 has not been billed to her insurance  She states that she called billing department and was told that she did not come to the appointment.  Please review and advise.  Thank you, [place], RN

## 2023-12-15 NOTE — Telephone Encounter (Signed)
 Patient said that she was seen in the clinic on 10/12/23. She said she paid her $80 copay. She said the visit hasn't been filed with insurance and she wants it filed with insurance because the $80 goes toward her out of pocket max for the year. Patient is requesting to speak to someone regarding this. Thanks.   Call back: [phone]

## 2023-12-16 ENCOUNTER — Encounter

## 2023-12-19 NOTE — Progress Notes (Signed)
 Care[facility] Summary Report / Loop Recorder

## 2023-12-21 ENCOUNTER — Telehealth: Payer: Self-pay

## 2023-12-21 ENCOUNTER — Ambulatory Visit (HOSPITAL_COMMUNITY)
Admission: RE | Admit: 2023-12-21 | Discharge: 2023-12-21 | Disposition: A | Discharge status: Home / Self Care | Source: Ambulatory Visit | Attending: Vascular Surgery | Admitting: Vascular Surgery

## 2023-12-21 DIAGNOSIS — I83812 Varicose veins of left lower extremities with pain: Secondary | ICD-10-CM | POA: Diagnosis not present

## 2023-12-21 NOTE — Telephone Encounter (Signed)
   Pre-operative Risk Assessment    Patient Name: Erica Ho  DOB: 1980-12-03 MRN: [id number]   Date of last office visit: 08/15/23 Date of next office visit:   Request for Surgical Clearance    Procedure:  Dental Extraction - Amount of Teeth to be Pulled:  Single tooth extraction with bone grafting, mandibular cyst biopsy   Date of Surgery:  Clearance TBD                                 Surgeon:   Surgeon's Group or Practice Name:  [facility]  Phone number:  [phone] Fax number:  [phone]   Type of Clearance Requested:   - Medical  - Pharmacy:  Hold Rivaroxaban  (Xarelto ) Not indicated    Type of Anesthesia:  conscious sedation with intravenous midazolam and fentanyl    Additional requests/questions:    Bonney Rebeca Blight   12/21/2023, 5:22 PM

## 2023-12-26 ENCOUNTER — Ambulatory Visit (INDEPENDENT_AMBULATORY_CARE_PROVIDER_SITE_OTHER)

## 2023-12-26 DIAGNOSIS — R000 Tachycardia, unspecified: Secondary | ICD-10-CM

## 2023-12-26 LAB — CUP PACEART REMOTE DEVICE CHECK
Date Time Interrogation Session: 20250727235914
Implantable Pulse Generator Implant Date: 20250317

## 2023-12-26 NOTE — Telephone Encounter (Signed)
 Pt tells me that she may not be using the dental office that sent the request. She may be looking at another dental office. Pt and I agreed that she will call, back once she has decided on which dental office and we will proceed from there. If she does change DDS offices we will need a new request from the new office as well.

## 2023-12-26 NOTE — Telephone Encounter (Signed)
 Patient with diagnosis of atrial flutter and prior PE on Xarelto  for anticoagulation.    Procedure:  Dental Extraction - Amount of Teeth to be Pulled:  Single tooth extraction with bone grafting, mandibular cyst biopsy    Date of Surgery:  Clearance TBD                                CHA2DS2-VASc Score = 1   This indicates a 0.6% annual risk of stroke. 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  No recent flutter per Loop recorder; patient is on PE prophylactic dose of Xarelto .  CrCl 100 (with adjusted body weight) Platelet count 246  Patient has not had an Afib/aflutter ablation within the last 3 months or DCCV within the last 30 days  Patient does not require pre-op antibiotics for dental procedure.  Per office protocol, patient can hold Xarelto  for 2 days prior to procedure.   Patient will not need bridging with Lovenox (enoxaparin) around procedure.  **This guidance is not considered finalized until pre-operative APP has relayed final recommendations.** considered finalized until pre-operative APP has relayed final recommendations.**

## 2023-12-26 NOTE — Telephone Encounter (Signed)
   Name: Erica Ho  DOB: [birth year]  MRN: [id number]  Primary Cardiologist: Lynwood Schilling, MD   Preoperative team, please contact this patient and set up a phone call appointment for further preoperative risk assessment. Please obtain consent and complete medication review. Thank you for your help.  I confirm that guidance regarding antiplatelet and oral anticoagulation therapy has been completed and, if necessary, noted below.  Per Pharm D, patient  may hold Xarelto  for 2 days prior to procedure. Patient will not need bridging with Lovenox around procedure.      I also confirmed the patient resides in the state of [place] . As per [facility] Medical Board telemedicine laws, the patient must reside in the state in which the provider is licensed.    Barnie Hila, NP 12/26/2023, 1:29 PM [facility]

## 2024-01-01 ENCOUNTER — Ambulatory Visit: Payer: Self-pay | Admitting: Cardiology

## 2024-01-02 ENCOUNTER — Encounter

## 2024-01-03 ENCOUNTER — Telehealth: Payer: Self-pay

## 2024-01-03 NOTE — Telephone Encounter (Signed)
   Pre-operative Risk Assessment    Patient Name: Erica Ho  DOB: [birth year] MRN: [id number]   Date of last office visit: 08/15/23 STEVEN KLEIN, MD Date of next office visit: NONE  Request for Surgical Clearance    Procedure:  CORONECTOMY OF TOOTH 17 W/ BIOPSY OF LESION AND BONE GRAFT (INTRABONY MANDIBLE)  Date of Surgery:  Clearance TBD                                Surgeon:  NOT INDICATED Surgeon's Group or Practice Name:  [facility] Phone number:  [phone] Fax number:  [phone]   Type of Clearance Requested:   - Medical  - Pharmacy:  Hold Rivaroxaban  (Xarelto ) 48 HOURS BEFORE, RESTARTED 24 HOURS AFTER   Type of Anesthesia:  Local    Additional requests/questions:    Signed, Lucie DELENA Ku   01/03/2024, 5:34 PM

## 2024-01-04 NOTE — Telephone Encounter (Signed)
 Pharmacy please advise on holding Xarelto  for 48 hours prior to CORONECTOMY OF TOOTH 17 W/ BIOPSY OF LESION AND BONE GRAFT (INTRABONY MANDIBLE)  scheduled for TBD. Last labs 08/14/2023 Thank you.

## 2024-01-05 NOTE — Telephone Encounter (Signed)
 Patient is following up requesting updates + to schedule pre-op appointment. Unsure whether this needs to be virtual/office, or if appointment is needed. Please advise.

## 2024-01-06 NOTE — Telephone Encounter (Signed)
 Pt called office requesting an update.

## 2024-01-06 NOTE — Telephone Encounter (Signed)
 Pt was updated regarding preop clearance

## 2024-01-13 ENCOUNTER — Telehealth: Payer: Self-pay

## 2024-01-13 NOTE — Telephone Encounter (Signed)
   Name: Erica Ho  DOB: Jun 14, 1980  MRN: [id number]  Primary Cardiologist: Lynwood Schilling, MD   Preoperative team, please contact this patient and set up a phone call appointment for further preoperative risk assessment. Please obtain consent and complete medication review. Thank you for your help.  I confirm that guidance regarding antiplatelet and oral anticoagulation therapy has been completed and, if necessary, noted below.   Patient does not require pre-op antibiotics for dental procedure.   Per office protocol, patient can hold Xarelto  for 2 days prior to procedure.   Patient will not need bridging with Lovenox (enoxaparin) around procedure.   I also confirmed the patient resides in the state of [place] . As per [facility] Medical Board telemedicine laws, the patient must reside in the state in which the provider is licensed.   Lamarr Satterfield, NP 01/13/2024, 9:44 AM [facility]

## 2024-01-13 NOTE — Telephone Encounter (Signed)
 Patient with diagnosis of atrial fibrillation and prior PE (2017) on Xarelto  for anticoagulation.    Procedure:  CORONECTOMY OF TOOTH 17 W/ BIOPSY OF LESION AND BONE GRAFT (INTRABONY MANDIBLE)   Date of Surgery:  Clearance TBD   CHA2DS2-VASc Score = 1   This indicates a 0.6% annual risk of stroke. The patient's score is based upon: CHF History: 0 HTN History: 0 Diabetes History: 0 Stroke History: 0 Vascular Disease History: 0 Age Score: 0 Gender Score: 1      CrCl > 100 Platelet count 246  Patient has not had an Afib/aflutter ablation within the last 3 months or DCCV within the last 30 days  Patient does not require pre-op antibiotics for dental procedure.  Per office protocol, patient can hold Xarelto  for 2 days prior to procedure.   Patient will not need bridging with Lovenox (enoxaparin) around procedure.  **This guidance is not considered finalized until pre-operative APP has relayed final recommendations.**

## 2024-01-13 NOTE — Telephone Encounter (Signed)
 Preop tele appt now scheduled, med rec and consent done. Provider use slot OK per APP Lamarr Satterfield

## 2024-01-13 NOTE — Telephone Encounter (Signed)
  Patient Consent for Virtual Visit        Erica Ho has provided verbal consent on 01/13/2024 for a virtual visit (video or telephone).   CONSENT FOR VIRTUAL VISIT FOR:  Erica Ho  By participating in this virtual visit I agree to the following:  I hereby voluntarily request, consent and authorize [facility] and its employed or contracted physicians, physician assistants, nurse practitioners or other licensed health care professionals (the Practitioner), to provide me with telemedicine health care services (the "Services) as deemed necessary by the treating Practitioner. I acknowledge and consent to receive the Services by the Practitioner via telemedicine. I understand that the telemedicine visit will involve communicating with the Practitioner through live audiovisual communication technology and the disclosure of certain medical information by electronic transmission. I acknowledge that I have been given the opportunity to request an in-person assessment or other available alternative prior to the telemedicine visit and am voluntarily participating in the telemedicine visit.  I understand that I have the right to withhold or withdraw my consent to the use of telemedicine in the course of my care at any time, without affecting my right to future care or treatment, and that the Practitioner or I may terminate the telemedicine visit at any time. I understand that I have the right to inspect all information obtained and/or recorded in the course of the telemedicine visit and may receive copies of available information for a reasonable fee.  I understand that some of the potential risks of receiving the Services via telemedicine include:  Delay or interruption in medical evaluation due to technological equipment failure or disruption; Information transmitted may not be sufficient (e.g. poor resolution of images) to allow for appropriate medical decision making by the  Practitioner; and/or  In rare instances, security protocols could fail, causing a breach of personal health information.  Furthermore, I acknowledge that it is my responsibility to provide information about my medical history, conditions and care that is complete and accurate to the best of my ability. I acknowledge that Practitioner's advice, recommendations, and/or decision may be based on factors not within their control, such as incomplete or inaccurate data provided by me or distortions of diagnostic images or specimens that may result from electronic transmissions. I understand that the practice of medicine is not an exact science and that Practitioner makes no warranties or guarantees regarding treatment outcomes. I acknowledge that a copy of this consent can be made available to me via my patient portal [facility] MyChart), or I can request a printed copy by calling the office of  [facility].    I understand that my insurance will be billed for this visit.   I have read or had this consent read to me. I understand the contents of this consent, which adequately explains the benefits and risks of the Services being provided via telemedicine.  I have been provided ample opportunity to ask questions regarding this consent and the Services and have had my questions answered to my satisfaction. I give my informed consent for the services to be provided through the use of telemedicine in my medical care

## 2024-01-16 ENCOUNTER — Ambulatory Visit: Attending: Cardiology | Admitting: Emergency Medicine

## 2024-01-16 DIAGNOSIS — Z01810 Encounter for preprocedural cardiovascular examination: Secondary | ICD-10-CM | POA: Diagnosis not present

## 2024-01-16 NOTE — Progress Notes (Signed)
 Virtual Visit via Telephone Note   Because of Erica Ho co-morbid illnesses, she is at least at moderate risk for complications without adequate follow up.  This format is felt to be most appropriate for this patient at this time.  Due to technical limitations with video connection (technology), today's appointment will be conducted as an audio only telehealth visit, and Erica Ho verbally agreed to proceed in this manner.   All issues noted in this document were discussed and addressed.  No physical exam could be performed with this format.  Evaluation Performed:  Preoperative cardiovascular risk assessment _____________   Date:  01/16/2024   Patient ID:  Erica Ho, DOB [birth year], MRN [id number] Patient Location:  Home Provider location:   Office  Primary Care Provider:  Silvano Angeline FALCON, NP Primary Cardiologist:  Lynwood Schilling, MD  Chief Complaint / Patient Profile   43 y.o. y/o female with a h/o atrial flutter/fib, history of PE, GERD, PACs, aortic insufficiency who is pending coronectomy of tooth 17 with biopsy of lesion and bone graft on date TBD with oral and facial surgery and presents today for telephonic preoperative cardiovascular risk assessment.  History of Present Illness    Erica Ho is a 43 y.o. female who presents via audio/video conferencing for a telehealth visit today.  Pt was last seen in cardiology clinic on 08/15/2023 by Dr. Fernande.  At that time Erica Ho was doing well.  The patient is now pending procedure as outlined above. Since her last visit, she denies chest pain, shortness of breath, lower extremity edema, fatigue, melena, hematuria, hemoptysis, diaphoresis, weakness, presyncope, syncope, orthopnea, and PND.  Today patient is doing well overall.  She is without acute cardiovascular concerns or complaints today.  She reports occasional palpitations which has been chronic with no acute changes.  She stays relatively active  without exertional or anginal symptoms.  Overall she is able to complete greater than 4 METS.  Past Medical History    Past Medical History:  Diagnosis Date   ABLATION SUPERFICAL VIENS LEFT LEG 11/2016   Anemia    RESOLVED   Anxiety    Arthritis    LOWER BACK   Blood dyscrasia    Headache    OCULAR MIGRAINES   Heart palpitations    a. 05/2016: event monitor showing PAC's with one episode of 4 beats NSVT   History of migraine headaches    PE (pulmonary thromboembolism) (HCC)    a. 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    Varicose veins    Past Surgical History:  Procedure Laterality Date   CESAREAN SECTION     CHOLECYSTECTOMY     ENDOVENOUS ABLATION SAPHENOUS VEIN W/ LASER Left 12/15/2016   endovenous laser ablation L GSV and stab phlebectomy > 20 incisions left leg by Lynwood Collum MD    LOOP RECORDER INSERTION N/A 09/30/2017   Procedure: LOOP RECORDER INSERTION;  Surgeon: Fernande Elspeth JAYSON, MD;  Location: [facility] INVASIVE CV LAB;  Service: Cardiovascular;  Laterality: N/A;   MASS EXCISION Right 12/15/2022   Procedure: MINOR EXCISION OF RIGHT THUMB DIGITAL MUCOUSCYST;  Surgeon: Romona Harari, MD;  Location: [facility];  Service: Orthopedics;  Laterality: Right;  43   TONSILLECTOMY     TUBAL LIGATION     WISDOM TOOTH EXTRACTION      Allergies  Allergies  Allergen Reactions   Imitrex [Sumatriptan] Palpitations   Cardizem  [Diltiazem ] Other (See Comments)    Caused blood pressure to drop  Isovue  [Iopamidol ] Other (See Comments)    Couldn't focus eyes well afterwards; things looked like they had shadows   Levofloxacin Other (See Comments)    Skin felt like it was on fire   Nsaids Other (See Comments)    Cannot take because she is taking Xarelto  now    Other Palpitations    STEROID INJECTION-PATIENT UNSURE OF NAME OF INJECTION- CAUSED RAPID HEART RATE   Tolmetin Other (See Comments)    Cannot take because she is taking Xarelto  now    Home Medications    Prior  to Admission medications   Medication Sig Start Date End Date Taking? Authorizing Provider  clonazePAM (KLONOPIN) 0.5 MG tablet Take 0.5 mg by mouth 2 (two) times daily. 01/03/17   [provider]  doxycycline (VIBRAMYCIN) 100 MG capsule Take 100 mg by mouth 2 (two) times daily. For acne    [provider]  ferrous sulfate 325 (65 FE) MG EC tablet Take 325 mg by mouth daily with breakfast.     [provider]  fluticasone (FLONASE) 50 MCG/ACT nasal spray Place into both nostrils. 04/04/20   [provider]  magnesium  oxide (MAG-OX) 400 (241.3 Mg) MG tablet Take 1 tablet (400 mg total) by mouth daily. 08/04/18   Fernande Elspeth BROCKS, MD  meclizine  (ANTIVERT ) 25 MG tablet Take 1 tablet (25 mg total) by mouth 3 (three) times daily as needed for dizziness. 08/14/23   Patsey Lot, MD  rivaroxaban  (XARELTO ) 10 MG TABS tablet Take 1 tablet (10 mg total) by mouth daily. 08/13/22   Lavona Agent, MD  topiramate (TOPAMAX) 25 MG tablet Take 25 mg by mouth daily. Starts today    [provider]    Physical Exam    Vital Signs:  [name] does not have vital signs available for review today.  Given telephonic nature of communication, physical exam is limited. AAOx3. NAD. Normal affect.  Speech and respirations are unlabored.  Accessory Clinical Findings    None  Assessment & Plan    1.  Preoperative Cardiovascular Risk Assessment: According to the Revised Cardiac Risk Index (RCRI), her Perioperative Risk of Major Cardiac Event is (%): 0.4. Her Functional Capacity in METs is: 5.62 according to the Duke Activity Status Index (DASI). Therefore, based on ACC/AHA guidelines, patient would be at acceptable risk for the planned procedure without further cardiovascular testing.  The patient was advised that if she develops new symptoms prior to surgery to contact our office to arrange for a follow-up visit, and she verbalized understanding.  Patient does not  require pre-op antibiotics for dental procedure.   Per office protocol, patient can hold Xarelto  for 2 days prior to procedure.   Patient will not need bridging with Lovenox (enoxaparin) around procedure.  A copy of this note will be routed to requesting surgeon.  Time:   Today, I have spent 8 minutes with the patient with telehealth technology discussing medical history, symptoms, and management plan.     Lum LITTIE Louis, NP  01/16/2024, 10:59 AM

## 2024-01-26 ENCOUNTER — Ambulatory Visit

## 2024-01-26 DIAGNOSIS — R002 Palpitations: Secondary | ICD-10-CM | POA: Diagnosis not present

## 2024-01-27 LAB — CUP PACEART REMOTE DEVICE CHECK
Date Time Interrogation Session: 20250827234654
Implantable Pulse Generator Implant Date: 20250317

## 2024-01-29 ENCOUNTER — Ambulatory Visit: Payer: Self-pay | Admitting: Cardiology

## 2024-02-01 ENCOUNTER — Ambulatory Visit: Attending: Vascular Surgery | Admitting: Vascular Surgery

## 2024-02-01 ENCOUNTER — Encounter: Payer: Self-pay | Admitting: Vascular Surgery

## 2024-02-01 VITALS — BP 143/86 | HR 84 | Temp 98.2°F | Resp 20 | Ht 61.0 in | Wt 169.2 lb

## 2024-02-01 DIAGNOSIS — I83812 Varicose veins of left lower extremities with pain: Secondary | ICD-10-CM | POA: Diagnosis not present

## 2024-02-01 NOTE — Progress Notes (Signed)
 Patient ID: Erica Ho, female   DOB: 27-Jan-1981, 43 y.o.   MRN: [id number]  Reason for Consult: Follow-up   Referred by Silvano Angeline FALCON, NP  Subjective:     HPI:  Erica Ho is a 43 y.o. female with history of thrombophlebitis in the past and ultimately underwent left greater saphenous vein ablation with stab phlebectomy for greater than 20 sites in 2018.  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  She also has an area of the left medial ankle that has persistently been painful since the initial procedure.  She has been compliant with thigh-high compression stockings since her initial evaluation and was wearing them on the visit time today.  She maintains on Xarelto  for history of PE and previously was followed by Dr. Freddie currently Xarelto  managed by Dr. Lavona.  She complains of leg swelling and pain over the varicosities that worsens throughout the day despite the use of compression stockings.  She has not had any bleeding from the varicosities and no recent issues with thrombophlebitis.  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 05/2016: event monitor showing PAC's with one episode of 4 beats NSVT   History of migraine headaches    PE (pulmonary thromboembolism) (HCC)    a. 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    Varicose veins    Family History  Problem Relation Age of Onset   Diabetes Mother    Emphysema Mother    Cancer Father    Lupus Daughter    Breast cancer Maternal Grandmother    Clotting disorder Neg Hx    Past Surgical History:  Procedure Laterality Date   CESAREAN SECTION     CHOLECYSTECTOMY     ENDOVENOUS ABLATION SAPHENOUS VEIN W/ LASER Left 12/15/2016   endovenous laser ablation L GSV and stab phlebectomy > 20  incisions left leg by Lynwood Collum MD    LOOP RECORDER INSERTION N/A 09/30/2017   Procedure: LOOP RECORDER INSERTION;  Surgeon: Fernande Elspeth JAYSON, MD;  Location: [facility] INVASIVE CV LAB;  Service: Cardiovascular;  Laterality: N/A;   MASS EXCISION Right 12/15/2022   Procedure: MINOR EXCISION OF RIGHT THUMB DIGITAL MUCOUSCYST;  Surgeon: Romona Harari, MD;  Location: [facility];  Service: Orthopedics;  Laterality: Right;  43   TONSILLECTOMY     TUBAL LIGATION     WISDOM TOOTH EXTRACTION      Short Social History:  Social History   Tobacco Use   Smoking status: Former    Current packs/day: 0.00    Types: Cigarettes    Quit date: 08/26/2011    Years since quitting: 12.4   Smokeless tobacco: Never   Tobacco comments:    Smoked about 12 years, off and on  Substance Use Topics   Alcohol use: No    Alcohol/week: 0.0 standard drinks of alcohol    Allergies  Allergen Reactions   Imitrex [Sumatriptan] Palpitations   Cardizem  [Diltiazem ] Other (See Comments)    Caused blood pressure to drop   Isovue  [Iopamidol ] Other (See Comments)    Couldn't focus eyes well afterwards; things looked like they had shadows   Levofloxacin Other (See Comments)    Skin felt like  it was on fire   Nsaids Other (See Comments)    Cannot take because she is taking Xarelto  now    Other Palpitations    STEROID INJECTION-PATIENT UNSURE OF NAME OF INJECTION- CAUSED RAPID HEART RATE   Tolmetin Other (See Comments)    Cannot take because she is taking Xarelto  now    Current Outpatient Medications  Medication Sig Dispense Refill   clonazePAM (KLONOPIN) 0.5 MG tablet Take 0.5 mg by mouth 2 (two) times daily.     doxycycline (VIBRAMYCIN) 100 MG capsule Take 100 mg by mouth 2 (two) times daily. For acne     ferrous sulfate 325 (65 FE) MG EC tablet Take 325 mg by mouth daily with breakfast.      fluticasone (FLONASE) 50 MCG/ACT nasal spray Place into both nostrils.     magnesium  oxide (MAG-OX) 400  (241.3 Mg) MG tablet Take 1 tablet (400 mg total) by mouth daily.     meclizine  (ANTIVERT ) 25 MG tablet Take 1 tablet (25 mg total) by mouth 3 (three) times daily as needed for dizziness. 20 tablet 0   rivaroxaban  (XARELTO ) 10 MG TABS tablet Take 1 tablet (10 mg total) by mouth daily. 30 tablet 0   topiramate (TOPAMAX) 25 MG tablet Take 25 mg by mouth daily. Starts today     No current facility-administered medications for this visit.    Review of Systems  Constitutional:  Constitutional negative. HENT: HENT negative.  Eyes: Eyes negative.  Respiratory: Respiratory negative.  Cardiovascular: Positive for leg swelling.  GI: Gastrointestinal negative.  Musculoskeletal: Positive for leg pain.  Neurological: Neurological negative. Hematologic: Hematologic/lymphatic negative.  Psychiatric: Psychiatric negative.        Objective:  Objective   Vitals:   02/01/24 1540  BP: (!) 143/86  Pulse: 84  Resp: 20  Temp: 98.2 F (36.8 C)  TempSrc: Temporal  SpO2: 98%  Weight: 169 lb 3.2 oz (76.7 kg)  Height: 5' 1 (1.549 m)   Body mass index is 31.97 kg/m.  Physical Exam HENT:     Head: Normocephalic.     Nose: Nose normal.  Eyes:     Pupils: Pupils are equal, round, and reactive to light.  Cardiovascular:     Rate and Rhythm: Normal rate.     Pulses: Normal pulses.  Pulmonary:     Effort: Pulmonary effort is normal.  Abdominal:     General: Abdomen is flat.  Musculoskeletal:        General: Normal range of motion.     Cervical back: Normal range of motion.     Right lower leg: No edema.     Left lower leg: Edema present.  Skin:    General: Skin is warm.     Capillary Refill: Capillary refill takes less than 2 seconds.  Neurological:     General: No focal deficit present.     Mental Status: She is alert.  Psychiatric:        Mood and Affect: Mood normal.        Thought Content: Thought content normal.        Judgment: Judgment normal.     Data: LEFT           Reflux NoRefluxReflux TimeDiameter cmsComments                                        Yes                                                 +--------------+---------+------+-----------+------------+-----------------  ----+  CFV          no                                                            +--------------+---------+------+-----------+------------+-----------------  ----+  FV mid                  yes   >1 second                                     +--------------+---------+------+-----------+------------+-----------------  ----+  Popliteal    no                                                            +--------------+---------+------+-----------+------------+-----------------  ----+  GSV at [facility]              yes    >500 ms      0.67                            +--------------+---------+------+-----------+------------+-----------------  ----+  GSV prox thigh          yes    >500 ms      0.88                            +--------------+---------+------+-----------+------------+-----------------  ----+  GSV mid thigh           yes    >500 ms      0.30    mid thigh branch  with                                                     reflux >574ms           +--------------+---------+------+-----------+------------+-----------------  ----+  GSV dist thigh                                      NWV                     +--------------+---------+------+-----------+------------+-----------------  ----+  GSV at knee                                         NWV                     +--------------+---------+------+-----------+------------+-----------------  ----+  GSV prox calf                               0.13                            +--------------+---------+------+-----------+------------+-----------------  ----+  GSV mid calf            yes    >500 ms      0.13                             +--------------+---------+------+-----------+------------+-----------------  ----+  SSV at [facility]    no                            0.17                            +--------------+---------+------+-----------+------------+-----------------  ----+  SSV prox calf                                       NWV                     +--------------+---------+------+-----------+------------+-----------------  ----+         Summary:  Left:  - No evidence of deep vein thrombosis seen in the left lower extremity,  from the common femoral through the popliteal veins.  - No evidence of superficial venous thrombosis in the left lower  extremity.  - The deep venous system is incompetent at the mid femoral vein.  - The great saphenous vein is incompetent in the visualized segments.  - The small saphenous vein is competent.      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  We have discussed proceeding with accessory saphenous vein ablation as well as stab phlebectomy between 10 and 20 sites.  We discussed the risk particularly with her history of DVT and the need to hold Xarelto  for 48 hours prior to the procedure and 24 hours post procedure.  We discussed the need for continued compression stockings.  She demonstrates good understanding we will get her scheduled in the near future.     Penne Lonni Colorado MD [facility]

## 2024-02-02 NOTE — Progress Notes (Unsigned)
 Cardiology Office Note:   Date:  02/03/2024  ID:  Erica Ho, DOB September 18, 1980, MRN [id number] PCP: Silvano Angeline FALCON, NP  [facility] Providers Cardiologist:  Lynwood Schilling, MD {  History of Present Illness:   Erica Ho is a 43 y.o. female who I saw previously for evaluation of dizziness.  She has had an extensive work up.  She has been seen by physicians at [facility], [place] and here.   She was seen by [facility] cardiology.  She had 30 day event monitor was found have PACs but no symptomatic dysrhythmias. There was sinus tachycardia noted. She did have an echocardiogram and it was normal.   At one point she had  loss of vision. She had an evaluation to include a CT which was essentially unremarkable. She was seeing neurology and had an MRI. In December 2017 she had shortness of breath and some chest discomfort and had a pulmonary embolism documented. She's been on anticoagulation. She saw cardiology after this and had a question of atrial fibrillation though no documentation. She wore an event monitor. She had a superficial thrombosis but was never been found to have a DVT. She's had no clotting disorders documented.   She has had chest pain and a negative POET (Plain Old Exercise Treadmill).  Work up at [facility] did not demonstrate an autonomic disorder.   She saw Dr. Fernande and had an event monitor placed.   She had PVCs and PACs.   Her device battery has expired.  We are following her rhythm with a Watch.   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    Since I last saw her she had vertigo she has had 3 times.  She actually was in the emergency room in March and had an MRI that was unremarkable.  Her implanted monitor was not working at that time and she has since had the battery replaced.  I have seen most recent interrogations and there have been no dysrhythmias but she has not had any of these vertiginous symptoms since then.  She is having some fatigue with activities but  she does not sleep very well.  I did note that her monitor demonstrates 0.3% PVCs and she is not really feeling a lot of palpitations.  She is not having any new chest pressure, neck or arm discomfort.  She has had some weight gain.  She has had no new edema.  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  ROS: As stated in the HPI and negative for all other systems.  Studies Reviewed:    EKG:     08/15/2023 sinus rhythm, rate 71, axis within normal limits, intervals within normal limits, no acute ST-T wave changes.  Risk Assessment/Calculations:              Physical Exam:   VS:  BP 114/62   Pulse 82   Ht 5' 1 (1.549 m)   Wt 169 lb (76.7 kg)   SpO2 98%   BMI 31.93 kg/m    Wt Readings from Last 3 Encounters:  02/03/24 169 lb (76.7 kg)  02/01/24 169 lb 3.2 oz (76.7 kg)  08/15/23 177 lb (80.3 kg)     GEN: Well nourished, well developed in no acute distress NECK: No JVD; No carotid bruits CARDIAC: RRR, 2 out of 6 brief apical systolic diastolic murmur, no systolic murmurs, rubs, gallops RESPIRATORY:  Clear to auscultation without rales, wheezing or rhonchi  ABDOMEN: Soft, non-tender, non-distended EXTREMITIES:  No edema; No deformity   ASSESSMENT AND PLAN:   THROMBOPHLEBITIS:    She will continue with anticoagulation.  She had blood work done earlier this year.  She is not anemic.  She is tolerating this.  No change in therapy.   ARRHYTHMIA:   She has had no significant dysrhythmias on her implanted monitor.  No change in therapy.   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  I will listen to her murmur again in a year and would likely plan an echocardiogram for January 2027.     Follow up with me in 1 year  Signed, Lynwood Schilling, MD

## 2024-02-03 ENCOUNTER — Encounter: Payer: Self-pay | Admitting: Cardiology

## 2024-02-03 ENCOUNTER — Ambulatory Visit: Attending: Cardiology | Admitting: Cardiology

## 2024-02-03 VITALS — BP 114/62 | HR 82 | Ht 61.0 in | Wt 169.0 lb

## 2024-02-03 DIAGNOSIS — I351 Nonrheumatic aortic (valve) insufficiency: Secondary | ICD-10-CM

## 2024-02-03 NOTE — Patient Instructions (Signed)

## 2024-02-03 NOTE — Progress Notes (Signed)
 Remote Loop Recorder Transmission

## 2024-02-06 ENCOUNTER — Encounter

## 2024-02-09 NOTE — Progress Notes (Unsigned)
 Remote Loop Recorder Transmission

## 2024-02-15 ENCOUNTER — Telehealth: Payer: Self-pay

## 2024-02-15 ENCOUNTER — Encounter: Payer: Self-pay | Admitting: *Deleted

## 2024-02-15 NOTE — Telephone Encounter (Signed)
 See MyChart message

## 2024-02-16 ENCOUNTER — Other Ambulatory Visit: Payer: Self-pay | Admitting: *Deleted

## 2024-02-16 DIAGNOSIS — I83892 Varicose veins of left lower extremities with other complications: Secondary | ICD-10-CM

## 2024-02-27 ENCOUNTER — Ambulatory Visit (INDEPENDENT_AMBULATORY_CARE_PROVIDER_SITE_OTHER)

## 2024-02-27 DIAGNOSIS — R002 Palpitations: Secondary | ICD-10-CM

## 2024-02-27 LAB — CUP PACEART REMOTE DEVICE CHECK
Date Time Interrogation Session: 20250928235059
Implantable Pulse Generator Implant Date: 20250317

## 2024-02-29 NOTE — Progress Notes (Signed)
 Remote Loop Recorder Transmission

## 2024-03-01 ENCOUNTER — Ambulatory Visit: Payer: Self-pay | Admitting: Cardiology

## 2024-03-01 NOTE — Progress Notes (Signed)
 Remote Loop Recorder Transmission

## 2024-03-06 ENCOUNTER — Telehealth: Payer: Self-pay

## 2024-03-06 NOTE — Telephone Encounter (Signed)
 Alert received from [employer] for 1 new symptom activation 10/4 @ 09:00,  EGM c/w SR/SB with change to ST in the 140's.  See patients message below for + symptoms.

## 2024-03-12 ENCOUNTER — Encounter

## 2024-03-29 ENCOUNTER — Ambulatory Visit (INDEPENDENT_AMBULATORY_CARE_PROVIDER_SITE_OTHER)

## 2024-03-29 DIAGNOSIS — R000 Tachycardia, unspecified: Secondary | ICD-10-CM

## 2024-03-29 LAB — CUP PACEART REMOTE DEVICE CHECK
Date Time Interrogation Session: 20251029234956
Implantable Pulse Generator Implant Date: 20250317

## 2024-04-01 ENCOUNTER — Ambulatory Visit: Payer: Self-pay | Admitting: Cardiology

## 2024-04-03 NOTE — Progress Notes (Signed)
 Remote Loop Recorder Transmission

## 2024-04-04 ENCOUNTER — Other Ambulatory Visit: Payer: Self-pay | Admitting: Medical Genetics

## 2024-04-04 DIAGNOSIS — Z006 Encounter for examination for normal comparison and control in clinical research program: Secondary | ICD-10-CM

## 2024-04-12 ENCOUNTER — Encounter: Payer: Self-pay | Admitting: *Deleted

## 2024-04-16 ENCOUNTER — Encounter

## 2024-04-18 LAB — COLOGUARD: COLOGUARD: NEGATIVE

## 2024-04-27 LAB — GENECONNECT MOLECULAR SCREEN: Genetic Analysis Overall Interpretation: NEGATIVE

## 2024-04-29 ENCOUNTER — Ambulatory Visit

## 2024-04-30 ENCOUNTER — Encounter

## 2024-05-02 ENCOUNTER — Ambulatory Visit: Attending: Cardiology

## 2024-05-03 LAB — CUP PACEART REMOTE DEVICE CHECK
Date Time Interrogation Session: 20251202233613
Implantable Pulse Generator Implant Date: 20250317

## 2024-05-06 ENCOUNTER — Ambulatory Visit: Payer: Self-pay | Admitting: Cardiology

## 2024-05-09 NOTE — Progress Notes (Signed)
 Remote Loop Recorder Transmission

## 2024-05-10 ENCOUNTER — Other Ambulatory Visit: Admitting: Vascular Surgery

## 2024-05-21 ENCOUNTER — Encounter

## 2024-05-30 ENCOUNTER — Encounter (HOSPITAL_COMMUNITY)

## 2024-05-30 ENCOUNTER — Encounter: Admitting: Vascular Surgery

## 2024-05-30 ENCOUNTER — Ambulatory Visit

## 2024-06-02 ENCOUNTER — Ambulatory Visit: Attending: Cardiology

## 2024-06-02 DIAGNOSIS — R000 Tachycardia, unspecified: Secondary | ICD-10-CM | POA: Diagnosis not present

## 2024-06-04 LAB — CUP PACEART REMOTE DEVICE CHECK
Date Time Interrogation Session: 20260102233106
Implantable Pulse Generator Implant Date: 20250317

## 2024-06-07 NOTE — Progress Notes (Signed)
 Remote Loop Recorder Transmission

## 2024-06-09 ENCOUNTER — Ambulatory Visit: Payer: Self-pay | Admitting: Cardiology

## 2024-06-30 ENCOUNTER — Ambulatory Visit

## 2024-07-03 ENCOUNTER — Ambulatory Visit

## 2024-07-03 LAB — CUP PACEART REMOTE DEVICE CHECK
Date Time Interrogation Session: 20260202232750
Implantable Pulse Generator Implant Date: 20250317

## 2024-08-03 ENCOUNTER — Ambulatory Visit

## 2024-09-03 ENCOUNTER — Ambulatory Visit

## 2024-10-04 ENCOUNTER — Ambulatory Visit

## 2024-11-04 ENCOUNTER — Ambulatory Visit

## 2024-12-05 ENCOUNTER — Ambulatory Visit

## 2025-01-05 ENCOUNTER — Ambulatory Visit

## 2025-02-05 ENCOUNTER — Ambulatory Visit

## 2025-03-08 ENCOUNTER — Ambulatory Visit

## 2025-04-08 ENCOUNTER — Ambulatory Visit

## 2025-05-09 ENCOUNTER — Ambulatory Visit

## 2025-06-09 ENCOUNTER — Ambulatory Visit
